# Patient Record
Sex: Male | Born: 1955 | Race: White | Hispanic: No | Marital: Married | Smoking: Current every day smoker
Health system: Southern US, Community
[De-identification: ages and names within clinical notes are randomized; demographics above are authoritative.]

## PROBLEM LIST (undated history)

## (undated) DIAGNOSIS — M1990 Unspecified osteoarthritis, unspecified site: Secondary | ICD-10-CM

## (undated) DIAGNOSIS — J449 Chronic obstructive pulmonary disease, unspecified: Secondary | ICD-10-CM

## (undated) DIAGNOSIS — I2510 Atherosclerotic heart disease of native coronary artery without angina pectoris: Secondary | ICD-10-CM

## (undated) DIAGNOSIS — R7303 Prediabetes: Secondary | ICD-10-CM

## (undated) HISTORY — PX: OTHER SURGICAL HISTORY: SHX169

---

## 1998-05-31 ENCOUNTER — Ambulatory Visit (HOSPITAL_COMMUNITY): Admission: RE | Admit: 1998-05-31 | Discharge: 1998-05-31 | Payer: Self-pay | Admitting: *Deleted

## 1998-08-15 ENCOUNTER — Ambulatory Visit (HOSPITAL_BASED_OUTPATIENT_CLINIC_OR_DEPARTMENT_OTHER): Admission: RE | Admit: 1998-08-15 | Discharge: 1998-08-15 | Payer: Self-pay | Admitting: Orthopedic Surgery

## 2000-07-13 ENCOUNTER — Ambulatory Visit (HOSPITAL_COMMUNITY): Admission: RE | Admit: 2000-07-13 | Discharge: 2000-07-13 | Payer: Self-pay | Admitting: Family Medicine

## 2000-07-16 ENCOUNTER — Ambulatory Visit (HOSPITAL_COMMUNITY): Admission: RE | Admit: 2000-07-16 | Discharge: 2000-07-16 | Payer: Self-pay | Admitting: Family Medicine

## 2000-07-16 ENCOUNTER — Encounter: Payer: Self-pay | Admitting: Family Medicine

## 2000-10-06 ENCOUNTER — Ambulatory Visit (HOSPITAL_BASED_OUTPATIENT_CLINIC_OR_DEPARTMENT_OTHER): Admission: RE | Admit: 2000-10-06 | Discharge: 2000-10-06 | Payer: Self-pay | Admitting: Orthopedic Surgery

## 2001-03-21 ENCOUNTER — Ambulatory Visit (HOSPITAL_COMMUNITY): Admission: RE | Admit: 2001-03-21 | Discharge: 2001-03-21 | Payer: Self-pay | Admitting: General Surgery

## 2001-05-23 ENCOUNTER — Emergency Department (HOSPITAL_COMMUNITY): Admission: EM | Admit: 2001-05-23 | Discharge: 2001-05-24 | Payer: Self-pay | Admitting: Emergency Medicine

## 2003-04-11 ENCOUNTER — Emergency Department (HOSPITAL_COMMUNITY): Admission: EM | Admit: 2003-04-11 | Discharge: 2003-04-11 | Payer: Self-pay | Admitting: Emergency Medicine

## 2004-11-06 ENCOUNTER — Encounter: Admission: RE | Admit: 2004-11-06 | Discharge: 2004-11-06 | Payer: Self-pay | Admitting: Family Medicine

## 2004-11-07 ENCOUNTER — Encounter: Admission: RE | Admit: 2004-11-07 | Discharge: 2004-11-07 | Payer: Self-pay | Admitting: Family Medicine

## 2004-11-09 HISTORY — PX: ANKLE FUSION: SHX881

## 2005-04-14 ENCOUNTER — Encounter: Admission: RE | Admit: 2005-04-14 | Discharge: 2005-04-14 | Payer: Self-pay | Admitting: Family Medicine

## 2005-10-28 ENCOUNTER — Encounter: Admission: RE | Admit: 2005-10-28 | Discharge: 2005-10-28 | Payer: Self-pay | Admitting: Family Medicine

## 2006-07-17 ENCOUNTER — Emergency Department (HOSPITAL_COMMUNITY): Admission: EM | Admit: 2006-07-17 | Discharge: 2006-07-17 | Payer: Self-pay | Admitting: Emergency Medicine

## 2006-08-15 ENCOUNTER — Encounter: Admission: RE | Admit: 2006-08-15 | Discharge: 2006-08-15 | Payer: Self-pay | Admitting: Family Medicine

## 2006-11-09 HISTORY — PX: BACK SURGERY: SHX140

## 2007-04-29 ENCOUNTER — Inpatient Hospital Stay (HOSPITAL_COMMUNITY): Admission: RE | Admit: 2007-04-29 | Discharge: 2007-04-30 | Payer: Self-pay | Admitting: Neurosurgery

## 2008-09-12 ENCOUNTER — Encounter: Admission: RE | Admit: 2008-09-12 | Discharge: 2008-09-12 | Payer: Self-pay | Admitting: Neurosurgery

## 2008-10-24 ENCOUNTER — Inpatient Hospital Stay (HOSPITAL_COMMUNITY): Admission: RE | Admit: 2008-10-24 | Discharge: 2008-10-25 | Payer: Self-pay | Admitting: Orthopedic Surgery

## 2009-02-18 ENCOUNTER — Encounter: Admission: RE | Admit: 2009-02-18 | Discharge: 2009-02-18 | Payer: Self-pay | Admitting: Orthopedic Surgery

## 2009-06-19 ENCOUNTER — Encounter: Admission: RE | Admit: 2009-06-19 | Discharge: 2009-06-19 | Payer: Self-pay

## 2010-11-29 ENCOUNTER — Encounter: Payer: Self-pay | Admitting: Family Medicine

## 2010-12-18 ENCOUNTER — Other Ambulatory Visit: Payer: Self-pay | Admitting: Neurosurgery

## 2010-12-18 DIAGNOSIS — M545 Low back pain: Secondary | ICD-10-CM

## 2010-12-18 DIAGNOSIS — M542 Cervicalgia: Secondary | ICD-10-CM

## 2010-12-21 ENCOUNTER — Ambulatory Visit
Admission: RE | Admit: 2010-12-21 | Discharge: 2010-12-21 | Disposition: A | Discharge status: Home / Self Care | Payer: 59 | Source: Ambulatory Visit | Attending: Neurosurgery | Admitting: Neurosurgery

## 2010-12-21 DIAGNOSIS — M545 Low back pain: Secondary | ICD-10-CM

## 2010-12-21 DIAGNOSIS — M542 Cervicalgia: Secondary | ICD-10-CM

## 2010-12-25 ENCOUNTER — Ambulatory Visit
Admission: RE | Admit: 2010-12-25 | Discharge: 2010-12-25 | Disposition: A | Discharge status: Home / Self Care | Payer: 59 | Source: Ambulatory Visit | Attending: Neurosurgery | Admitting: Neurosurgery

## 2010-12-25 ENCOUNTER — Other Ambulatory Visit: Payer: Self-pay | Admitting: Neurosurgery

## 2010-12-25 DIAGNOSIS — M545 Low back pain: Secondary | ICD-10-CM

## 2011-03-24 NOTE — Op Note (Signed)
NAME:  Daniel Hurley, Daniel Hurley NO.:  [id number]   MEDICAL RECORD NO.:  [id number]          PATIENT TYPE:  INP   LOCATION:  3172                         FACILITY:  [facility]   PHYSICIAN:  Kathaleen Maser. Pool, M.D.    DATE OF BIRTH:  02-05-1956   DATE OF PROCEDURE:  04/29/2007  DATE OF DISCHARGE:                               OPERATIVE REPORT   PREOPERATIVE DIAGNOSIS:  L4-L5 degenerative disc disease, facet  arthropathy, and stenosis.   POSTOPERATIVE DIAGNOSIS:  L4-L5 degenerative disc disease, facet  arthropathy, and stenosis.   PROCEDURE NOTE:  L4-L5 decompressive laminectomy with foraminotomies at  both L4 and L5, more than would be required for simple interbody fusion,  alone.  L4-L5 posterior lumbar interbody fusion utilizing Tangent  interbody allograft wedge, Telamon interbody PEEK cage, and local  autografting.  Posterolateral arthrodesis utilizing nonsegmental pedicle  screw fixation and local autograft.   SURGEON:  Kathaleen Maser. Pool, M.D.   ASSISTANT:  Tia Alert, M.D.   ANESTHESIA:  General.   INDICATIONS:  Daniel Hurley is a 55 year old male injured in a work related  accident with severe left sided low back pain.  Workup demonstrates  evidence of transitional anatomy along the lumbosacral junction with  marked facet arthropathy at what we are calling the L4-L5 level.  The  patient has evidence of some degree of facet fracturing and marked  arthropathy on the left side.  He has failed all efforts at conservative  management.  He presents now for decompression, fusion, and  instrumentation in hopes of improving his symptoms.   OPERATIVE NOTE:  The patient was placed on the operating table in a  supine position.  After an adequate level of anesthesia was achieved,  the patient was placed prone onto a Wilson frame and appropriately  padded.  The patient's lumbar region was prepped and draped in the usual  sterile fashion.  A 10 blade was used to make a linear skin  incision  overlying the L4-L5 interspace.  This was carried sharply in the  midline.  Subperiosteal dissection was performed exposing the lamina and  facet joints of L4 and L5.  Deep self-retaining retractors was placed.  Intraoperative fluoroscopy was used and the levels were confirmed.   Decompressive laminectomy was then performed using Kerrison rongeurs,  Leksell rongeurs, and a high speed drill to remove the entire lamina of  L4, entire inferior facet of L4 bilaterally, and superior facet of L5  bilaterally.  The superior aspect of the lamina at L5 was also resected.  All bone was cleaned and used for later autografting.  Wide  decompressive foraminotomies were then performed along the exiting L4  and L5 nerve roots bilaterally.  Ligamentum flavum was then elevated and  resected in a piecemeal fashion, as well.  Epidural venous plexus were  coagulated and cut.  Starting first on the patient's left side, the  thecal sac and nerve roots were protected and retracted towards the  midline.  The disc space was then incised with a 15 blade in a  rectangular fashion.  A wide disc space clean out  was achieved using  pituitary rongeurs, up and angled pituitary rongeurs, and Epstein  curets.  The procedure was then repeated on the contralateral side.   The disc space was then sequentially distracted 12 mm with a 12 mm  distractor left in the patient's left side.  The thecal sac and nerve  roots were protected on the patient's right side.  The disc space was  then reamed and then cut with 12 mm Tangent instruments.  The soft  tissues were removed from the interspace.  A 12 x 26 mm Telamon cage  packed with morselized autograft and Progenix bone putty was then packed  into place and recessed approximately 2 mm posterior to the cortical  margin.  The distractor was moved to the patient's left side.  The  thecal sac and nerve roots were protected on the left side.  The disc  space then reamed and  cut with 12 mm Tangent instruments.  Soft tissues  were removed from the interspace.  The disc space was further  curettaged.  Morselized autograft mixed with Progenix putty was then  packed in the interspace.  A 12 x 26 mm Tangent wedge was then impacted  into place and recessed approximately 2 mm to the posterior cortical  margin.   The pedicles of L4 and L5 were then identified using surface landmarks  and intraoperative fluoroscopy.  The superficial bone on the pedicle was  then removed using a high speed drill.  Each pedicle was then probed  using a pedicle awl.  Each pedicle awl track was then probed and found  to be solid in bone.  The pedicle awl track was then tapped with 5.25 mm  screw tap.  Each screw tap hole was probed and found to be solid in  bone.  6.75 x 45 mm radius screws were placed bilaterally at L4, 6.75 x  40 mm screws were placed bilaterally at L5.  The transverse processes of  L4 and L5 were then decorticated using the high speed drill.  Morselized  autograft mixed with Progenix putty was packed posterolaterally.  A  short segment titanium rod was placed over the screw heads at L4 and L5.  Locking caps were placed over the screw heads.  Locking caps were then  engaged with the construct under compression.   Final images revealed good position of the bone grafts.  The wound was  then irrigated with antibiotic solution.  Gelfoam was placed topically  for hemostasis which was found to be good.  The retractor system was  removed.  A medium Hemovac drain was left in the epidural space.  The  wound was then closed in layers with Vicryl sutures.  Steri-Strips and  sterile dressings were applied.  There were no complications.  The  patient tolerated the procedure well and he returns to the recovery room  for postoperative care.           ______________________________  Kathaleen Maser. Pool, M.D.     HAP/MEDQ  D:  04/29/2007  T:  04/29/2007  Job:  [id number]

## 2011-03-24 NOTE — Op Note (Signed)
NAME:  CASHEL, BELLINA NO.:  [id number]   MEDICAL RECORD NO.:  [id number]          PATIENT TYPE:  INP   LOCATION:  5017                         FACILITY:  [facility]   PHYSICIAN:  Nadara Mustard, MD     DATE OF BIRTH:  1955-12-01   DATE OF PROCEDURE:  10/24/2008  DATE OF DISCHARGE:                               OPERATIVE REPORT   PREOPERATIVE DIAGNOSIS:  Nonunion right subtalar fusion.   POSTOPERATIVE DIAGNOSIS:  Nonunion right subtalar fusion.   PROCEDURES:  1. Removal of deep retained hardware.  2. Open right subtalar fusion with use of the extra-small infuse      graft.   SURGEON:  Nadara Mustard, MD   ANESTHESIA:  General.   ESTIMATED BLOOD LOSS:  Minimal.   ANTIBIOTICS:  1 g of Kefzol.   DRAINS:  None.   COMPLICATIONS:  None.   DISPOSITION:  To PACU in stable condition.   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  The patient underwent a previous posterior  subtalar arthroscopic fusion.  The patient had persistent pain.  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  He had persistent pain and  presents at this time for revision of surgery.  Risks and benefits were  discussed including infection, neurovascular injury, nonhealing of the  wound, nonhealing of the bone, need for additional surgery.  The patient  states he understands and wished to proceed at this time.   DESCRIPTION OF PROCEDURE:  The patient was brought to OR room #5 and  underwent a general anesthetic.  After adequate level of anesthesia was  obtained, the patient's right lower extremity was prepped using DuraPrep  and draped in a sterile field.  Collier Flowers was used to cover all exposed  skin.  A posterior incision was made over the previous posterior  cannulated screws placement.  A  guidewire was inserted over the screws  and the 2 screws were removed with the assistance from the C-arm  fluoroscopy.  Attention was then focused over the sinus tarsi region in  line with the skin creases.  An oblique incision was made over the sinus  tarsi.  The extensor muscles were elevated proximally and retracted  distally.  An osteotome, curette were used to debride the subtalar  joint.  C-arm fluoroscopy verified complete debridement of the subtalar  joint.  An incision was made dorsally just medial to the anterior tibial  tendon.  Blunt dissection was carried down to the talar neck.  A  guidewire was inserted from the talus across the subtalar joint into the  subtalar into the calcaneus.  This was overdrilled and then a 80 mm x  7.3 cannulated screw was inserted.  C-arm fluoroscopy verified  reduction, both AP and lateral planes.  The subtalar joint was again  irrigated and this was then packed with the patient's own bone graft as  well as the Infuse bone morphogenic  protein.  The muscle, fascial layer  was then closed using 2-0 Vicryl.  The skin was closed using 2-0 nylon  with modified vertical mattress suture.  All 3 wounds were closed with  the nylon.  The wounds were then covered with Adaptic, orthopedic  sponges, ABD dressing, Webril, Kerlix, and Coban.  The patient was  extubated, taken to PACU in stable condition.      Nadara Mustard, MD  Electronically Signed     MVD/MEDQ  D:  10/24/2008  T:  10/24/2008  Job:  [id number]

## 2011-03-27 NOTE — Op Note (Signed)
[facility]  Patient:    ALTON, BOUKNIGHT                      MRN: [id number] Proc. Date: 03/21/01 Adm. Date:  [id number] Attending:  Tempie Donning CC:         Lillia Carmel, M.D.   Operative Report  OPERATIVE PROCEDURE:  Repair left inguinal hernia - combined direct and indirect with Prolene mesh preperitoneal and onlay.  SURGEON:  Gita Kudo, M.D.  ANESTHESIA:  General  - by oral airway.  PREOPERATIVE DIAGNOSIS:  Left inguinal hernia.  POSTOPERATIVE DIAGNOSES:  Left inguinal hernia, medium-large direct and very small indirect, also lipoma of the cord.  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  OPERATIVE FINDINGS:  The patient had a medium-sized to large-size direct hernia.  There was a very small indirect hernia sac that was empty.  There was a large lipoma of the cord.  OPERATIVE PROCEDURE:  Under satisfactory general anesthesia, having received 1.0 gram Ancef preop, the patients abdomen and genitalia were prepped and draped in a standard fashion.  A transverse incision was made and carried down to and through the external ring.  The cord and its contents were mobilized. Bleeders were coagulated or tied with 3-0 Vicryl.  Self-retaining retractors gave excellent exposure.  The fatty lipoma of the cord was dissected high, clamped, excised, and the space tied with 3-0 Vicryl.  A very small indirect sac identified, dissected high, twisted, and ligated again with 3-0 Vicryl doubly.  Then the floor of the canal was opened from the pubis medially to the internal ring.  Finger dissection used to develop the preperitoneal space, and then the contents reduced and held away with a moistened gauze.  A third of the 3 x 6 inch piece of Prolene mesh was tailored into an oval to fit in the floor.  It was anchored at [facility] ligament with a 0  Prolene suture and then unfolded inferiorly and laterally.  The packing was then removed and the mesh unfolded superiorly and medially and extended all the way up under the inferior epigastric vessels.  The floor of the canal was then closed over this with running 0 Prolene suture, taking intermittent bites of the mesh.  At the internal ring, the suture was tied and the ends left long.  The ring was snug. The remainder of the mesh was tailored into an oval with the slit to go around the cord structures.  It was anchored at the internal ring with a previous suture and then tacked around the periphery under slight tension with 0 Prolene to the inguinal ligament below and the internal oblique above.  The tails were sutured to each other and the fascia above and lateral to the cord. The wound was then infiltrated with 40 cc of Marcaine for postop analgesia, lavaged with saline, and closed in layers.  Running 2-0 Vicryl approximated the external oblique, interrupted 2-0 Vicryl for deep fascia, 3-0 Vicryl for subcutaneous, Steri-Strips for skin.  Sterile absorbent dressings applied, and the patient went to the recovery room from the operating room in good condition. DD:  03/21/01 TD:  03/21/01 Job: [id number]

## 2011-08-13 LAB — CBC
HCT: 49 % (ref 39.0–52.0)
Hemoglobin: 16.6 g/dL (ref 13.0–17.0)
MCHC: 33.8 g/dL (ref 30.0–36.0)
MCV: 92.3 fL (ref 78.0–100.0)
RBC: 5.3 MIL/uL (ref 4.22–5.81)
RDW: 13.7 % (ref 11.5–15.5)

## 2011-08-13 LAB — COMPREHENSIVE METABOLIC PANEL
ALT: 13 U/L (ref 0–53)
BUN: 8 mg/dL (ref 6–23)
CO2: 28 mEq/L (ref 19–32)
Calcium: 9.9 mg/dL (ref 8.4–10.5)
GFR calc non Af Amer: >60 mL/min (ref >60)
Glucose, Bld: 90 mg/dL (ref 70–99)
Sodium: 140 mEq/L (ref 135–145)
Total Protein: 6.8 g/dL (ref 6.0–8.3)

## 2011-08-13 LAB — PROTIME-INR
INR: 0.9 (ref 0.00–1.49)
Prothrombin Time: 12.3 seconds (ref 11.6–15.2)
Prothrombin Time: 13.5 seconds (ref 11.6–15.2)

## 2011-08-26 LAB — CBC
HCT: 45.8
MCV: 89.3
Platelets: 263
RDW: 13.6

## 2011-08-26 LAB — BASIC METABOLIC PANEL
BUN: 9
Chloride: 107
Creatinine, Ser: 1.04
GFR calc non Af Amer: >60
Glucose, Bld: 107 — ABNORMAL HIGH
Potassium: 3.9

## 2011-08-26 LAB — TYPE AND SCREEN: ABO/RH(D): O POS

## 2011-08-26 LAB — DIFFERENTIAL
Basophils Absolute: 0
Eosinophils Absolute: 0.2
Eosinophils Relative: 2
Neutrophils Relative %: 57

## 2014-12-12 ENCOUNTER — Other Ambulatory Visit: Payer: Self-pay | Admitting: Internal Medicine

## 2014-12-12 ENCOUNTER — Ambulatory Visit
Admission: RE | Admit: 2014-12-12 | Discharge: 2014-12-12 | Disposition: A | Discharge status: Home / Self Care | Payer: 59 | Source: Ambulatory Visit | Attending: Internal Medicine | Admitting: Internal Medicine

## 2014-12-12 DIAGNOSIS — R0602 Shortness of breath: Secondary | ICD-10-CM

## 2016-01-18 ENCOUNTER — Encounter (HOSPITAL_COMMUNITY): Payer: Self-pay

## 2016-01-18 ENCOUNTER — Emergency Department (HOSPITAL_COMMUNITY): Payer: Medicare Other

## 2016-01-18 DIAGNOSIS — T380X5A Adverse effect of glucocorticoids and synthetic analogues, initial encounter: Secondary | ICD-10-CM | POA: Diagnosis present

## 2016-01-18 DIAGNOSIS — Z981 Arthrodesis status: Secondary | ICD-10-CM

## 2016-01-18 DIAGNOSIS — F17210 Nicotine dependence, cigarettes, uncomplicated: Secondary | ICD-10-CM | POA: Diagnosis present

## 2016-01-18 DIAGNOSIS — R079 Chest pain, unspecified: Secondary | ICD-10-CM | POA: Diagnosis not present

## 2016-01-18 DIAGNOSIS — Z23 Encounter for immunization: Secondary | ICD-10-CM

## 2016-01-18 DIAGNOSIS — J441 Chronic obstructive pulmonary disease with (acute) exacerbation: Principal | ICD-10-CM | POA: Diagnosis present

## 2016-01-18 DIAGNOSIS — Z7982 Long term (current) use of aspirin: Secondary | ICD-10-CM

## 2016-01-18 DIAGNOSIS — D72829 Elevated white blood cell count, unspecified: Secondary | ICD-10-CM | POA: Diagnosis present

## 2016-01-18 DIAGNOSIS — J9601 Acute respiratory failure with hypoxia: Secondary | ICD-10-CM | POA: Diagnosis present

## 2016-01-18 LAB — CBC
HEMATOCRIT: 48.4 % (ref 39.0–52.0)
HEMOGLOBIN: 16.2 g/dL (ref 13.0–17.0)
MCH: 30.7 pg (ref 26.0–34.0)
MCHC: 33.5 g/dL (ref 30.0–36.0)
MCV: 91.7 fL (ref 78.0–100.0)
Platelets: 259 10*3/uL (ref 150–400)
RBC: 5.28 MIL/uL (ref 4.22–5.81)
RDW: 13.3 % (ref 11.5–15.5)
WBC: 18.2 10*3/uL — AB (ref 4.0–10.5)

## 2016-01-18 LAB — BASIC METABOLIC PANEL
ANION GAP: 13 (ref 5–15)
BUN: 13 mg/dL (ref 6–20)
CALCIUM: 9.4 mg/dL (ref 8.9–10.3)
CO2: 22 mmol/L (ref 22–32)
Chloride: 102 mmol/L (ref 101–111)
Creatinine, Ser: 1.03 mg/dL (ref 0.61–1.24)
GLUCOSE: 130 mg/dL — AB (ref 65–99)
POTASSIUM: 4 mmol/L (ref 3.5–5.1)
SODIUM: 137 mmol/L (ref 135–145)

## 2016-01-18 LAB — I-STAT TROPONIN, ED: TROPONIN I, POC: 0 ng/mL (ref 0.00–0.08)

## 2016-01-18 MED ORDER — OXYCODONE-ACETAMINOPHEN 5-325 MG PO TABS
ORAL_TABLET | ORAL | Status: AC
  Filled 2016-01-18: qty 1

## 2016-01-18 MED ORDER — OXYCODONE-ACETAMINOPHEN 5-325 MG PO TABS
1.0000 | ORAL_TABLET | Freq: Once | ORAL | Status: AC
Start: 2016-01-18 — End: 2016-01-18
  Administered 2016-01-18: 1 via ORAL

## 2016-01-18 NOTE — ED Notes (Signed)
Pt reports onset yesterday left chest pain radiating down left arm, constant.  Pt took ASA 81 mg @ 1900 tonight and pain relieved for about 25 min then returned.  Pt reports some increase in usual shortness of breath.  No nausea, diaphoresis.

## 2016-01-19 ENCOUNTER — Encounter (HOSPITAL_COMMUNITY): Payer: Self-pay | Admitting: Radiology

## 2016-01-19 ENCOUNTER — Inpatient Hospital Stay (HOSPITAL_COMMUNITY)
Admission: EM | Admit: 2016-01-19 | Discharge: 2016-01-21 | DRG: 190 | Disposition: A | Discharge status: Home / Self Care | Payer: Medicare Other | Attending: Internal Medicine | Admitting: Internal Medicine

## 2016-01-19 ENCOUNTER — Emergency Department (HOSPITAL_COMMUNITY): Payer: Medicare Other

## 2016-01-19 DIAGNOSIS — Z720 Tobacco use: Secondary | ICD-10-CM | POA: Diagnosis present

## 2016-01-19 DIAGNOSIS — R0789 Other chest pain: Secondary | ICD-10-CM | POA: Diagnosis not present

## 2016-01-19 DIAGNOSIS — D72829 Elevated white blood cell count, unspecified: Secondary | ICD-10-CM | POA: Diagnosis present

## 2016-01-19 DIAGNOSIS — F17210 Nicotine dependence, cigarettes, uncomplicated: Secondary | ICD-10-CM | POA: Diagnosis present

## 2016-01-19 DIAGNOSIS — J441 Chronic obstructive pulmonary disease with (acute) exacerbation: Secondary | ICD-10-CM | POA: Diagnosis not present

## 2016-01-19 DIAGNOSIS — Z7982 Long term (current) use of aspirin: Secondary | ICD-10-CM | POA: Diagnosis not present

## 2016-01-19 DIAGNOSIS — R079 Chest pain, unspecified: Secondary | ICD-10-CM | POA: Diagnosis present

## 2016-01-19 DIAGNOSIS — J9601 Acute respiratory failure with hypoxia: Secondary | ICD-10-CM

## 2016-01-19 DIAGNOSIS — Z981 Arthrodesis status: Secondary | ICD-10-CM | POA: Diagnosis not present

## 2016-01-19 DIAGNOSIS — T380X5A Adverse effect of glucocorticoids and synthetic analogues, initial encounter: Secondary | ICD-10-CM | POA: Diagnosis present

## 2016-01-19 DIAGNOSIS — Z23 Encounter for immunization: Secondary | ICD-10-CM | POA: Diagnosis not present

## 2016-01-19 HISTORY — DX: Chronic obstructive pulmonary disease, unspecified: J44.9

## 2016-01-19 LAB — TROPONIN I: Troponin I: <0.03 ng/mL (ref <0.031)

## 2016-01-19 LAB — CBC
HEMATOCRIT: 47 % (ref 39.0–52.0)
Hemoglobin: 15.7 g/dL (ref 13.0–17.0)
MCH: 30.8 pg (ref 26.0–34.0)
MCHC: 33.4 g/dL (ref 30.0–36.0)
MCV: 92.2 fL (ref 78.0–100.0)
Platelets: 236 10*3/uL (ref 150–400)
RBC: 5.1 MIL/uL (ref 4.22–5.81)
RDW: 13.5 % (ref 11.5–15.5)
WBC: 13.5 10*3/uL — ABNORMAL HIGH (ref 4.0–10.5)

## 2016-01-19 LAB — CREATININE, SERUM
Creatinine, Ser: 0.92 mg/dL (ref 0.61–1.24)
GFR calc Af Amer: >60 mL/min (ref >60)
GFR calc non Af Amer: >60 mL/min (ref >60)

## 2016-01-19 LAB — TSH: TSH: 0.731 u[IU]/mL (ref 0.350–4.500)

## 2016-01-19 LAB — D-DIMER, QUANTITATIVE (NOT AT ARMC): D DIMER QUANT: 0.74 ug{FEU}/mL — AB (ref 0.00–0.50)

## 2016-01-19 MED ORDER — ONDANSETRON HCL 4 MG/2ML IJ SOLN: 4.0000 mg | Freq: Four times a day (QID) | INTRAMUSCULAR | Status: DC | PRN

## 2016-01-19 MED ORDER — ASPIRIN 81 MG PO CHEW
324.0000 mg | CHEWABLE_TABLET | Freq: Once | ORAL | Status: AC
  Administered 2016-01-19: 324 mg via ORAL
  Filled 2016-01-19: qty 4

## 2016-01-19 MED ORDER — METOPROLOL TARTRATE 25 MG PO TABS
25.0000 mg | ORAL_TABLET | Freq: Two times a day (BID) | ORAL | Status: DC
  Administered 2016-01-19 – 2016-01-21 (×5): 25 mg via ORAL
  Filled 2016-01-19 (×5): qty 1

## 2016-01-19 MED ORDER — PNEUMOCOCCAL VAC POLYVALENT 25 MCG/0.5ML IJ INJ
0.5000 mL | INJECTION | INTRAMUSCULAR | Status: DC
  Filled 2016-01-19: qty 0.5
  Filled 2016-01-19: qty 1

## 2016-01-19 MED ORDER — IPRATROPIUM-ALBUTEROL 0.5-2.5 (3) MG/3ML IN SOLN
3.0000 mL | Freq: Once | RESPIRATORY_TRACT | Status: AC
  Administered 2016-01-19: 3 mL via RESPIRATORY_TRACT
  Filled 2016-01-19: qty 3

## 2016-01-19 MED ORDER — ALBUTEROL SULFATE (2.5 MG/3ML) 0.083% IN NEBU: 2.5000 mg | INHALATION_SOLUTION | RESPIRATORY_TRACT | Status: DC | PRN

## 2016-01-19 MED ORDER — MORPHINE SULFATE (PF) 2 MG/ML IV SOLN: 2.0000 mg | INTRAVENOUS | Status: DC | PRN

## 2016-01-19 MED ORDER — IOHEXOL 350 MG/ML SOLN
80.0000 mL | Freq: Once | INTRAVENOUS | Status: AC | PRN
  Administered 2016-01-19: 80 mL via INTRAVENOUS

## 2016-01-19 MED ORDER — ASPIRIN 81 MG PO CHEW
81.0000 mg | CHEWABLE_TABLET | Freq: Every day | ORAL | Status: DC
  Administered 2016-01-19 – 2016-01-21 (×3): 81 mg via ORAL
  Filled 2016-01-19 (×3): qty 1

## 2016-01-19 MED ORDER — ONDANSETRON HCL 4 MG PO TABS: 4.0000 mg | ORAL_TABLET | Freq: Four times a day (QID) | ORAL | Status: DC | PRN

## 2016-01-19 MED ORDER — ENOXAPARIN SODIUM 40 MG/0.4ML ~~LOC~~ SOLN
40.0000 mg | SUBCUTANEOUS | Status: DC
  Administered 2016-01-19: 40 mg via SUBCUTANEOUS
  Filled 2016-01-19 (×3): qty 0.4

## 2016-01-19 MED ORDER — ATORVASTATIN CALCIUM 40 MG PO TABS
40.0000 mg | ORAL_TABLET | Freq: Every day | ORAL | Status: DC
  Administered 2016-01-19 – 2016-01-20 (×2): 40 mg via ORAL
  Filled 2016-01-19 (×2): qty 1

## 2016-01-19 MED ORDER — ALBUTEROL SULFATE (2.5 MG/3ML) 0.083% IN NEBU
2.5000 mg | INHALATION_SOLUTION | Freq: Four times a day (QID) | RESPIRATORY_TRACT | Status: DC
  Administered 2016-01-19: 2.5 mg via RESPIRATORY_TRACT
  Filled 2016-01-19: qty 3

## 2016-01-19 MED ORDER — METHYLPREDNISOLONE SODIUM SUCC 40 MG IJ SOLR
40.0000 mg | Freq: Four times a day (QID) | INTRAMUSCULAR | Status: DC
  Administered 2016-01-19 – 2016-01-21 (×9): 40 mg via INTRAVENOUS
  Filled 2016-01-19 (×9): qty 1

## 2016-01-19 MED ORDER — LEVOFLOXACIN IN D5W 750 MG/150ML IV SOLN
750.0000 mg | INTRAVENOUS | Status: DC
  Administered 2016-01-19: 750 mg via INTRAVENOUS
  Filled 2016-01-19: qty 150

## 2016-01-19 MED ORDER — ALBUTEROL SULFATE (2.5 MG/3ML) 0.083% IN NEBU
2.5000 mg | INHALATION_SOLUTION | Freq: Three times a day (TID) | RESPIRATORY_TRACT | Status: DC
  Administered 2016-01-19 – 2016-01-20 (×4): 2.5 mg via RESPIRATORY_TRACT
  Filled 2016-01-19 (×5): qty 3

## 2016-01-19 NOTE — ED Provider Notes (Signed)
CSN: [id number]     Arrival date & time 01/18/16  2029 History  By signing my name below, I, Octavia Heir, attest that this documentation has been prepared under the direction and in the presence of Glynn Octave, MD. Electronically Signed: Octavia Heir, ED Scribe. 01/19/2016. 1:33 AM.    Chief Complaint  Patient presents with  . Chest Pain     The history is provided by the patient. No language interpreter was used.   HPI Comments: Daniel Hurley is a 60 y.o. male who has a hx of COPD and emphysema presents to the Emergency Department complaining of constant, waxing and waning, moderate, 2/10, left sided chest pain onset last night. Pt notes the pain has been radiating down his left arm with associated shortness of breath and cough. He states he took a baby aspirin tonight to alleviate is pain with temporary relief for about 30 minutes. Pt endorses increased pain with ambulating and taking a deep breath. He notes his last stress test was a few years ago. Denies diaphoresis, nausea, vomiting, abdominal pain, pain or swelling in legs, falls, and injuries. Pt is a smoker.  Past Medical History  Diagnosis Date  . COPD (chronic obstructive pulmonary disease) [facility])    Past Surgical History  Procedure Laterality Date  . Back surgery  2008    L5-5 fusion   . Ankle fusion  2006  . Left wrist surgery     History reviewed. No pertinent family history. Social History  Substance Use Topics  . Smoking status: Current Every Day Smoker -- 1.50 packs/day    Types: Cigarettes  . Smokeless tobacco: None  . Alcohol Use: No    Review of Systems  A complete 10 system review of systems was obtained and all systems are negative except as noted in the HPI and PMH.    Allergies  Review of patient's allergies indicates no known allergies.  Home Medications   Prior to Admission medications   Medication Sig Start Date End Date Taking? Authorizing Provider  aspirin 81 MG chewable tablet Chew 81  mg by mouth daily.   Yes Historical Provider, MD  HYDROcodone-acetaminophen (NORCO) 7.5-325 MG tablet Take 1 tablet by mouth 3 (three) times daily as needed for moderate pain.  12/19/15  Yes Historical Provider, MD  sildenafil (REVATIO) 20 MG tablet Take 20 mg by mouth 2 (two) times daily. 12/23/15  Yes Historical Provider, MD   Triage vitals: BP 139/88 mmHg  Pulse 87  Temp(Src) 99.2 F (37.3 C) (Oral)  Resp 16  Ht  (1.702 m)  Wt 193 lb 12.8 oz (87.907 kg)  BMI 30.35 kg/m2  SpO2 98% Physical Exam  Constitutional: He is oriented to person, place, and time. He appears well-developed and well-nourished. No distress.  HENT:  Head: Normocephalic and atraumatic.  Mouth/Throat: Oropharynx is clear and moist. No oropharyngeal exudate.  Eyes: Conjunctivae and EOM are normal. Pupils are equal, round, and reactive to light.  Neck: Normal range of motion. Neck supple.  No meningismus.  Cardiovascular: Normal rate, regular rhythm, normal heart sounds and intact distal pulses.   No murmur heard. Pulmonary/Chest: Effort normal and breath sounds normal. No respiratory distress. He exhibits no tenderness.  Diffuse expiratory wheezing with decreased air exchange  Abdominal: Soft. There is no tenderness. There is no rebound and no guarding.  Musculoskeletal: Normal range of motion. He exhibits no edema or tenderness.  No pain with left arm movement  Neurological: He is alert and oriented to person,  place, and time. No cranial nerve deficit. He exhibits normal muscle tone. Coordination normal.  No ataxia on finger to nose bilaterally. No pronator drift. 5/5 strength throughout. CN 2-12 intact.Equal grip strength. Sensation intact.   Skin: Skin is warm.  Psychiatric: He has a normal mood and affect. His behavior is normal.  Nursing note and vitals reviewed.   ED Course  Procedures  DIAGNOSTIC STUDIES: Oxygen Saturation is 98% on RA, normal by my interpretation.  COORDINATION OF CARE:  1:28 AM  Discussed treatment plan which includes breathing treatment and lab work with pt at bedside and pt agreed to plan.  Labs Review Labs Reviewed  BASIC METABOLIC PANEL - Abnormal; Notable for the following:    Glucose, Bld 130 (*)    All other components within normal limits  CBC - Abnormal; Notable for the following:    WBC 18.2 (*)    All other components within normal limits  D-DIMER, QUANTITATIVE (NOT AT [facility]) - Abnormal; Notable for the following:    D-Dimer, Quant 0.74 (*)    All other components within normal limits  CBC - Abnormal; Notable for the following:    WBC 13.5 (*)    All other components within normal limits  TROPONIN I  CREATININE, SERUM  TSH  TROPONIN I  TROPONIN I  TROPONIN I  Rosezena SensorI-STAT TROPOININ, ED    Imaging Review Dg Chest 2 View  01/18/2016  CLINICAL DATA:  COPD.  Chest pain EXAM: CHEST  2 VIEW COMPARISON:  12/12/2014 FINDINGS: Normal heart size and stable mild aortic tortuosity. Chronic hyperinflation and interstitial coarsening. There is no edema, consolidation, effusion, or pneumothorax. Healing/healed lateral left tenth rib fracture. IMPRESSION: No evidence of active disease. Electronically Signed   By: Marnee SpringJonathon  Watts M.D.   On: 01/18/2016 20:18   Ct Angio Chest Pe W/cm &/or Wo Cm  01/19/2016  CLINICAL DATA:  Left-sided chest pain radiating to the left arm. EXAM: CT ANGIOGRAPHY CHEST WITH CONTRAST TECHNIQUE: Multidetector CT imaging of the chest was performed using the standard protocol during bolus administration of intravenous contrast. Multiplanar CT image reconstructions and MIPs were obtained to evaluate the vascular anatomy. CONTRAST:  80mL OMNIPAQUE IOHEXOL 350 MG/ML SOLN COMPARISON:  04/14/2005 FINDINGS: THORACIC INLET/BODY WALL: No acute abnormality. MEDIASTINUM: Normal heart size. No pericardial effusion. Atherosclerotic calcifications seen in the proximal LAD. Limited opacification of the aorta. No evidence of acute aortic disease. No evidence of  pulmonary embolism. LUNG WINDOWS: Diffuse marked airway thickening. Mild scattered atelectasis. There is no edema, consolidation, effusion, or pneumothorax. UPPER ABDOMEN: No acute findings.  Left nephrolithiasis. OSSEOUS: No acute fracture.  No suspicious lytic or blastic lesions. Review of the MIP images confirms the above findings. IMPRESSION: 1. No evidence of pulmonary embolism. 2. Bronchitis. 3. Coronary atherosclerosis. Electronically Signed   By: Marnee SpringJonathon  Watts M.D.   On: 01/19/2016 05:49   I have personally reviewed and evaluated these images and lab results as part of my medical decision-making.   EKG Interpretation   Date/Time:  Saturday January 18 2016 22:42:25 EST Ventricular Rate:  86 PR Interval:  166 QRS Duration: 68 QT Interval:  370 QTC Calculation: 442 R Axis:   31 Text Interpretation:  Normal sinus rhythm Normal ECG No significant change  was found Confirmed by Manus GunningANCOUR  MD, Tirzah Fross ([id number]) on 01/19/2016 1:30:46  AM      MDM   Final diagnoses:  Chest pain, unspecified chest pain type   left-sided chest pain ongoing for the past 2 days rating  to left arm somewhat constant but waxes and wanes in severity. Somewhat improved with  aspirin. Some shortness of breath worse with exertion.   EKG is normal sinus rhythm. Initial troponin is negative. Chest x-ray is negative.   Heart score 3. Wheezing resolved after neb.  No distress. No chest pain at rest.  Still pain with exertion. Patient with both typical and atypical features of pain. D-dimer elevated, CTPE negative but does show LAD calcification.  Admission for rule out d/w Dr.Jenkins.   I personally performed the services described in this documentation, which was scribed in my presence. The recorded information has been reviewed and is accurate.   Glynn Octave, MD 01/19/16 [phone]

## 2016-01-19 NOTE — ED Notes (Signed)
Patient presents with c/o left sided CP that goes into his left arm.  States it has been going on for 2 days almost constantly.  Stated he took 1 81mg  ASA and the pain stopped for a short time but them came back.  At this time he is not hurting per patient "they came me a pain pill in the lobby".  Denis diaphoresis

## 2016-01-19 NOTE — H&P (Signed)
[facility] History and Physical  Daniel Hurley ZOX:[id number] DOB: 13-Feb-1956    PCP:   [name], MD   Chief Complaint: left sided CP, coughs, SOB.   HPI: Daniel Hurley is an 60 y.o. male active smoker, hx of COPD, back surgery, but no known CAD, presented to the ER with left sided CP with coughing.  He also has a chronic cough, audible wheezing, and SOB.  He did not have exertional CP, and has no fever, chills, distant travel or ill contact.  Evaluation in the ER included a D Dimer of 0.74, resulting in a negative CTPA for PE or PNA, but showed coronary calcification.  He was given Nebs, and IV steroids, and hospitalist was asked to admit him for chest pain r/out. His EKG and troponin were unremarkable.   Rewiew of Systems:  Constitutional: Negative for malaise, fever and chills. No significant weight loss or weight gain Eyes: Negative for eye pain, redness and discharge, diplopia, visual changes, or flashes of light. ENMT: Negative for ear pain, hoarseness, nasal congestion, sinus pressure and sore throat. No headaches; tinnitus, drooling, or problem swallowing. Cardiovascular: Negative for chest pain, palpitations, diaphoresis, dyspnea and peripheral edema. ; No orthopnea, PND Respiratory: Negative for  hemoptysis,  and stridor. No pleuritic chestpain. Gastrointestinal: Negative for nausea, vomiting, diarrhea, constipation, abdominal pain, melena, blood in stool, hematemesis, jaundice and rectal bleeding.    Genitourinary: Negative for frequency, dysuria, incontinence,flank pain and hematuria; Musculoskeletal: Negative for back pain and neck pain. Negative for swelling and trauma.;  Skin: . Negative for pruritus, rash, abrasions, bruising and skin lesion.; ulcerations Neuro: Negative for headache, lightheadedness and neck stiffness. Negative for weakness, altered level of consciousness , altered mental status, extremity weakness, burning feet, involuntary movement,  seizure and syncope.  Psych: negative for anxiety, depression, insomnia, tearfulness, panic attacks, hallucinations, paranoia, suicidal or homicidal ideation    Past Medical History  Diagnosis Date  . COPD (chronic obstructive pulmonary disease) [facility])     Past Surgical History  Procedure Laterality Date  . Back surgery  2008    L5-5 fusion   . Ankle fusion  2006  . Left wrist surgery      Medications:  HOME MEDS: Prior to Admission medications   Medication Sig Start Date End Date Taking? Authorizing Provider  aspirin 81 MG chewable tablet Chew 81 mg by mouth daily.   Yes Historical Provider, MD  HYDROcodone-acetaminophen (NORCO) 7.5-325 MG tablet Take 1 tablet by mouth 3 (three) times daily as needed for moderate pain.  12/19/15  Yes Historical Provider, MD  sildenafil (REVATIO) 20 MG tablet Take 20 mg by mouth 2 (two) times daily. 12/23/15  Yes Historical Provider, MD     Allergies:  No Known Allergies  Social History:   reports that he has been smoking Cigarettes.  He has been smoking about 1.50 packs per day. He does not have any smokeless tobacco history on file. He reports that he does not drink alcohol or use illicit drugs.  Family History: History reviewed. No pertinent family history.   Physical Exam: Filed Vitals:   01/19/16 0614 01/19/16 0615 01/19/16 0700 01/19/16 0745  BP: 140/96 146/93 144/95 153/86  Pulse: 72 72 78 71  Temp:      TempSrc:      Resp: Height:      Weight:      SpO2: 99% 92% 93% 93%   Blood pressure 153/86, pulse 71, temperature 99.2 F (37.3  C), temperature source Oral, resp. rate 18, height 5\' 7"  (1.702 m), weight 87.907 kg (193 lb 12.8 oz), SpO2 93 %.  GEN:  Pleasant  patient lying in the stretcher in no acute distress; cooperative with exam. PSYCH:  alert and oriented x4; does not appear anxious or depressed; affect is appropriate. HEENT: Mucous membranes pink and anicteric; PERRLA; EOM intact; no cervical  lymphadenopathy nor thyromegaly or carotid bruit; no JVD; There were no stridor. Neck is very supple. Breasts:: Not examined CHEST WALL: palpable tenderness on left lower chest.  CHEST: Normal respiration, inspiratory and expiratory wheezing.  No rales.  HEART: Regular rate and rhythm.  There are no murmur, rub, or gallops.   BACK: No kyphosis or scoliosis; no CVA tenderness ABDOMEN: soft and non-tender; no masses, no organomegaly, normal abdominal bowel sounds; no pannus; no intertriginous candida. There is no rebound and no distention. Rectal Exam: Not done EXTREMITIES: No bone or joint deformity; age-appropriate arthropathy of the hands and knees; no edema; no ulcerations.  There is no calf tenderness. Genitalia: not examined PULSES: 2+ and symmetric SKIN: Normal hydration no rash or ulceration CNS: Cranial nerves 2-12 grossly intact no focal lateralizing neurologic deficit.  Speech is fluent; uvula elevated with phonation, facial symmetry and tongue midline. DTR are normal bilaterally, cerebella exam is intact, barbinski is negative and strengths are equaled bilaterally.  No sensory loss.   Labs on Admission:  Basic Metabolic Panel:  Recent Labs Lab 01/18/16 2052  NA 137  K 4.0  CL 102  CO2 22  GLUCOSE 130*  BUN 13  CREATININE 1.03  CALCIUM 9.4   CBC:  Recent Labs Lab 01/18/16 2052  WBC 18.2*  HGB 16.2  HCT 48.4  MCV 91.7  PLT 259   Cardiac Enzymes:  Recent Labs Lab 01/19/16 0143  TROPONINI <0.03    CBG: No results for input(s): GLUCAP in the last 168 hours.   Radiological Exams on Admission: Dg Chest 2 View  01/18/2016  CLINICAL DATA:  COPD.  Chest pain EXAM: CHEST  2 VIEW COMPARISON:  12/12/2014 FINDINGS: Normal heart size and stable mild aortic tortuosity. Chronic hyperinflation and interstitial coarsening. There is no edema, consolidation, effusion, or pneumothorax. Healing/healed lateral left tenth rib fracture. IMPRESSION: No evidence of active disease.  Electronically Signed   By: Daniel SpringJonathon  Hurley M.D.   On: 01/18/2016 20:18   Ct Angio Chest Pe W/cm &/or Wo Cm  01/19/2016  CLINICAL DATA:  Left-sided chest pain radiating to the left arm. EXAM: CT ANGIOGRAPHY CHEST WITH CONTRAST TECHNIQUE: Multidetector CT imaging of the chest was performed using the standard protocol during bolus administration of intravenous contrast. Multiplanar CT image reconstructions and MIPs were obtained to evaluate the vascular anatomy. CONTRAST:  80mL OMNIPAQUE IOHEXOL 350 MG/ML SOLN COMPARISON:  04/14/2005 FINDINGS: THORACIC INLET/BODY WALL: No acute abnormality. MEDIASTINUM: Normal heart size. No pericardial effusion. Atherosclerotic calcifications seen in the proximal LAD. Limited opacification of the aorta. No evidence of acute aortic disease. No evidence of pulmonary embolism. LUNG WINDOWS: Diffuse marked airway thickening. Mild scattered atelectasis. There is no edema, consolidation, effusion, or pneumothorax. UPPER ABDOMEN: No acute findings.  Left nephrolithiasis. OSSEOUS: No acute fracture.  No suspicious lytic or blastic lesions. Review of the MIP images confirms the above findings. IMPRESSION: 1. No evidence of pulmonary embolism. 2. Bronchitis. 3. Coronary atherosclerosis. Electronically Signed   By: Daniel SpringJonathon  Hurley M.D.   On: 01/19/2016 05:49    EKG: Independently reviewed.    Assessment/Plan Present on Admission:  .  Chest pain . COPD exacerbation (HCC) . Tobacco abuse  PLAN:  Atypical CP:  I think it is musculoskeletal wall tenderness, as evidence clinically when he coughs.  He has calcification in his coronary, and would benefit with risk reduction, and follow up outpatient stress test. Will cycle his troponins.  Needs to quit cigarettes, and obtain lipid profile in am.  Given his HTN, will add betablocker to his regimen. Start ASA daily.  Will start Lipitor, though he is resistant to taking medications.   COPD Exacerbation:  Will continue with Nebs, IV  steroids, and antibiotics.  Give cough suppressant.   He should be discharge on an inhaler.Marland KitchenMarland KitchenMarland KitchenMay be Advair?  Tobacco Abuse: Advised stop.   Other plans as per orders. Code Status: FULL Unk Lightning, MD. FACP [facility] Pager [phone] 7pm to 7am.  01/19/2016, 8:42 AM

## 2016-01-19 NOTE — ED Notes (Signed)
Nurse unable to take report at this time.

## 2016-01-20 DIAGNOSIS — J441 Chronic obstructive pulmonary disease with (acute) exacerbation: Principal | ICD-10-CM

## 2016-01-20 DIAGNOSIS — R0789 Other chest pain: Secondary | ICD-10-CM

## 2016-01-20 DIAGNOSIS — J9601 Acute respiratory failure with hypoxia: Secondary | ICD-10-CM

## 2016-01-20 LAB — BASIC METABOLIC PANEL
ANION GAP: 10 (ref 5–15)
BUN: 15 mg/dL (ref 6–20)
CALCIUM: 9.9 mg/dL (ref 8.9–10.3)
CHLORIDE: 105 mmol/L (ref 101–111)
CO2: 23 mmol/L (ref 22–32)
Creatinine, Ser: 1.02 mg/dL (ref 0.61–1.24)
GFR calc non Af Amer: >60 mL/min (ref >60)
Glucose, Bld: 227 mg/dL — ABNORMAL HIGH (ref 65–99)
POTASSIUM: 4.1 mmol/L (ref 3.5–5.1)
SODIUM: 138 mmol/L (ref 135–145)

## 2016-01-20 LAB — CBC
HCT: 50.2 % (ref 39.0–52.0)
Hemoglobin: 16.6 g/dL (ref 13.0–17.0)
MCH: 30.7 pg (ref 26.0–34.0)
MCHC: 33.1 g/dL (ref 30.0–36.0)
MCV: 92.8 fL (ref 78.0–100.0)
PLATELETS: 284 10*3/uL (ref 150–400)
RBC: 5.41 MIL/uL (ref 4.22–5.81)
RDW: 13.7 % (ref 11.5–15.5)
WBC: 28.4 10*3/uL — AB (ref 4.0–10.5)

## 2016-01-20 LAB — LIPID PANEL
CHOL/HDL RATIO: 3.3 ratio
CHOLESTEROL: 189 mg/dL (ref 0–200)
HDL: 57 mg/dL (ref >40)
LDL Cholesterol: 115 mg/dL — ABNORMAL HIGH (ref 0–99)
Triglycerides: 84 mg/dL (ref <150)
VLDL: 17 mg/dL (ref 0–40)

## 2016-01-20 MED ORDER — DOXYCYCLINE HYCLATE 100 MG PO TABS
100.0000 mg | ORAL_TABLET | Freq: Two times a day (BID) | ORAL | Status: DC
  Administered 2016-01-20 – 2016-01-21 (×3): 100 mg via ORAL
  Filled 2016-01-20 (×3): qty 1

## 2016-01-20 MED ORDER — TIOTROPIUM BROMIDE MONOHYDRATE 18 MCG IN CAPS
18.0000 ug | ORAL_CAPSULE | Freq: Every day | RESPIRATORY_TRACT | Status: DC
  Administered 2016-01-20: 18 ug via RESPIRATORY_TRACT
  Filled 2016-01-20: qty 5

## 2016-01-20 NOTE — Progress Notes (Signed)
[facility] PROGRESS NOTE    Progress Note   Daniel Hurley ZOX:[id number] DOB: 1956/01/10 DOA: 01/19/2016 PCP: [name], MD   Brief Narrative:   Daniel Hurley is an 60 y.o. male 60 y.o. male active smoker, hx of COPD, back surgery, but no known CAD, presented to the ER with left sided CP with coughing. He also has a chronic cough, audible wheezing, and SOB.  Assessment/Plan:   Acute respiratory failure with hypoxia due to COPD exacerbation (HCC): Initially in the ED he was hypoxic started on nasal canula O2L. Sat have remain > 88 % on RA. I agree with IV steroids and abx. He relates he still feels winded when he walks down the hall. Leukocytosis high due to steroids. Check saturations with ambulation.  Chest pain: Unlikely cardiac in origin. Cardiac markers are negative 3 EKG showed normal sinus rhythm with no T-wave abnormalities, and chest x-ray is negative. Stress test as an outpatient.  Tobacco abuse: Counseling    DVT Prophylaxis - Lovenox ordered.  Family Communication: none Disposition Plan: Home in am Code Status:     Code Status Orders        Start     Ordered   01/19/16 0840  Full code   Continuous     01/19/16 0841    Code Status History    Date Active Date Inactive Code Status Order ID Comments User Context   This patient has a current code status but no historical code status.        IV Access:    Peripheral IV   Procedures and diagnostic studies:   Dg Chest 2 View  01/18/2016  CLINICAL DATA:  COPD.  Chest pain EXAM: CHEST  2 VIEW COMPARISON:  12/12/2014 FINDINGS: Normal heart size and stable mild aortic tortuosity. Chronic hyperinflation and interstitial coarsening. There is no edema, consolidation, effusion, or pneumothorax. Healing/healed lateral left tenth rib fracture. IMPRESSION: No evidence of active disease. Electronically Signed   By: Marnee Spring M.D.   On: 01/18/2016 20:18   Ct Angio Chest Pe W/cm  &/or Wo Cm  01/19/2016  CLINICAL DATA:  Left-sided chest pain radiating to the left arm. EXAM: CT ANGIOGRAPHY CHEST WITH CONTRAST TECHNIQUE: Multidetector CT imaging of the chest was performed using the standard protocol during bolus administration of intravenous contrast. Multiplanar CT image reconstructions and MIPs were obtained to evaluate the vascular anatomy. CONTRAST:  80mL OMNIPAQUE IOHEXOL 350 MG/ML SOLN COMPARISON:  04/14/2005 FINDINGS: THORACIC INLET/BODY WALL: No acute abnormality. MEDIASTINUM: Normal heart size. No pericardial effusion. Atherosclerotic calcifications seen in the proximal LAD. Limited opacification of the aorta. No evidence of acute aortic disease. No evidence of pulmonary embolism. LUNG WINDOWS: Diffuse marked airway thickening. Mild scattered atelectasis. There is no edema, consolidation, effusion, or pneumothorax. UPPER ABDOMEN: No acute findings.  Left nephrolithiasis. OSSEOUS: No acute fracture.  No suspicious lytic or blastic lesions. Review of the MIP images confirms the above findings. IMPRESSION: 1. No evidence of pulmonary embolism. 2. Bronchitis. 3. Coronary atherosclerosis. Electronically Signed   By: Marnee Spring M.D.   On: 01/19/2016 05:49     Medical Consultants:    None.  Anti-Infectives:   Anti-infectives    Start     Dose/Rate Route Frequency Ordered Stop   01/19/16 0900  levofloxacin (LEVAQUIN) IVPB 750 mg     750 mg 100 mL/hr over 90 Minutes Intravenous Every 24 hours 01/19/16 0841        Subjective:    Daniel Garbe  Willa Hurley relates his breathing is improved to just feel mildly dyspneic when he ambulates.  Objective:    Filed Vitals:   01/19/16 2020 01/20/16 0020 01/20/16 0501 01/20/16 0505  BP: 136/66 134/77  129/81  Pulse: 78 67  71  Temp: 98.1 F (36.7 C) 98.1 F (36.7 C)  97.6 F (36.4 C)  TempSrc: Oral Oral  Oral  Resp: 16 18  18   Height:      Weight:   84.777 kg (186 lb 14.4 oz)   SpO2: 92% 92%  95%    Intake/Output  Summary (Last 24 hours) at 01/20/16 0719 Last data filed at 01/20/16 0200  Gross per 24 hour  Intake    720 ml  Output   1000 ml  Net   -280 ml   Filed Weights   01/18/16 2043 01/19/16 0900 01/20/16 0501  Weight: 87.907 kg (193 lb 12.8 oz) 85.412 kg (188 lb 4.8 oz) 84.777 kg (186 lb 14.4 oz)    Exam: Gen:  NAD Cardiovascular:  RRR, No M/R/G Chest and lungs:   Good air movement and wheezing bilaterally. Abdomen:  Abdomen soft, NT/ND, + BS Extremities:  No edema   Data Reviewed:    Labs: Basic Metabolic Panel:  Recent Labs Lab 01/18/16 2052 01/19/16 1055  NA 137  --   K 4.0  --   CL 102  --   CO2 22  --   GLUCOSE 130*  --   BUN 13  --   CREATININE 1.03 0.92  CALCIUM 9.4  --    GFR Estimated Creatinine Clearance: 89.4 mL/min (by C-G formula based on Cr of 0.92). Liver Function Tests: No results for input(s): AST, ALT, ALKPHOS, BILITOT, PROT, ALBUMIN in the last 168 hours. No results for input(s): LIPASE, AMYLASE in the last 168 hours. No results for input(s): AMMONIA in the last 168 hours. Coagulation profile No results for input(s): INR, PROTIME in the last 168 hours.  CBC:  Recent Labs Lab 01/18/16 2052 01/19/16 1055  WBC 18.2* 13.5*  HGB 16.2 15.7  HCT 48.4 47.0  MCV 91.7 92.2  PLT 259 236   Cardiac Enzymes:  Recent Labs Lab 01/19/16 0143 01/19/16 1055 01/19/16 1745 01/19/16 2016  TROPONINI <0.03 <0.03 <0.03 <0.03   BNP (last 3 results) No results for input(s): PROBNP in the last 8760 hours. CBG: No results for input(s): GLUCAP in the last 168 hours. D-Dimer:  Recent Labs  01/19/16 0143  DDIMER 0.74*   Hgb A1c: No results for input(s): HGBA1C in the last 72 hours. Lipid Profile: No results for input(s): CHOL, HDL, LDLCALC, TRIG, CHOLHDL, LDLDIRECT in the last 72 hours. Thyroid function studies:  Recent Labs  01/19/16 1055  TSH 0.731   Anemia work up: No results for input(s): VITAMINB12, FOLATE, FERRITIN, TIBC, IRON,  RETICCTPCT in the last 72 hours. Sepsis Labs:  Recent Labs Lab 01/18/16 2052 01/19/16 1055  WBC 18.2* 13.5*   Microbiology No results found for this or any previous visit (from the past 240 hour(s)).   Medications:   . albuterol  2.5 mg Nebulization TID  . aspirin  81 mg Oral Daily  . atorvastatin  40 mg Oral q1800  . enoxaparin (LOVENOX) injection  40 mg Subcutaneous Q24H  . levofloxacin (LEVAQUIN) IV  750 mg Intravenous Q24H  . methylPREDNISolone (SOLU-MEDROL) injection  40 mg Intravenous Q6H  . metoprolol tartrate  25 mg Oral BID  . pneumococcal 23 valent vaccine  0.5 mL Intramuscular Tomorrow-1000   Continuous Infusions:  Time spent: 25 min   LOS: 1 day   Marinda Elk  [facility] Pager [phone]  *Please refer to [email or web address], password [email or web address] to get updated schedule on who will round on this patient, as hospitalists switch teams weekly. If 7PM-7AM, please contact night-coverage at [email or web address], password [email or web address] for any overnight needs.  01/20/2016, 7:19 AM

## 2016-01-21 DIAGNOSIS — Z720 Tobacco use: Secondary | ICD-10-CM

## 2016-01-21 MED ORDER — ALBUTEROL SULFATE HFA 108 (90 BASE) MCG/ACT IN AERS: 2.0000 | INHALATION_SPRAY | Freq: Four times a day (QID) | RESPIRATORY_TRACT | Status: DC | PRN

## 2016-01-21 MED ORDER — DOXYCYCLINE HYCLATE 100 MG PO TABS: 100.0000 mg | ORAL_TABLET | Freq: Two times a day (BID) | ORAL | Status: DC

## 2016-01-21 MED ORDER — TIOTROPIUM BROMIDE MONOHYDRATE 18 MCG IN CAPS: 18.0000 ug | ORAL_CAPSULE | Freq: Every day | RESPIRATORY_TRACT | Status: DC

## 2016-01-21 MED ORDER — ATORVASTATIN CALCIUM 40 MG PO TABS: 40.0000 mg | ORAL_TABLET | Freq: Every day | ORAL | Status: DC

## 2016-01-21 MED ORDER — PREDNISONE 10 MG PO TABS: ORAL_TABLET | ORAL | Status: DC

## 2016-01-21 MED ORDER — METOPROLOL TARTRATE 25 MG PO TABS: 25.0000 mg | ORAL_TABLET | Freq: Two times a day (BID) | ORAL | Status: DC

## 2016-01-21 NOTE — Discharge Summary (Signed)
Physician Discharge Summary  Daniel Hurley UJW:[id number] DOB: 21-Oct-1956 DOA: 01/19/2016  PCP: [name], MD  Admit date: 01/19/2016 Discharge date: 01/21/2016  Time spent: 35 minutes  Recommendations for Outpatient Follow-up:  1. Follow with PCP in 2-4 weeks will continue counseling about smoking cessation.   Discharge Diagnoses:  Principal Problem:   COPD exacerbation (HCC) Active Problems:   Chest pain   Tobacco abuse   Acute respiratory failure with hypoxia [facility])   Discharge Condition:Stable  Diet recommendation: Heart healthy  Filed Weights   01/19/16 0900 01/20/16 0501 01/21/16 0431  Weight: 85.412 kg (188 lb 4.8 oz) 84.777 kg (186 lb 14.4 oz) 85.911 kg (189 lb 6.4 oz)    History of present illness:  60 y/o male with PMH of COPD and back surgery come sin with left sided CP cough and progressive SOB with ambulation.  Hospital Course:  Acute respiratory failure with hypoxia due to COPD exacerbation: He was found to be hypoxic in the ED, was started on empiric IV steroids antibiotics and inhalers, and shortness of breath improved. He was able to ambulate without desaturating. He will continue the steroid taper and antibiotics for 5 additional days. He was started on Spiriva which she will continue as an outpatient.  Chest pain: Likely due to COPD exacerbation cardiac markers were negative 3 EKG shows sinus rhythm with no T-wave abnormalities.  Tobacco abuse: Counseling was performed.  Procedures:  CT angio of chest  CXR  Consultations:  none  Discharge Exam: Filed Vitals:   01/20/16 2043 01/21/16 0114  BP: 117/69 108/61  Pulse: 87 71  Temp: 98 F (36.7 C) 98.2 F (36.8 C)  Resp: 18 18    General: A&O x Cardiovascular: RRR Respiratory: good air movement CTA B/L  Discharge Instructions   Discharge Instructions    Diet - low sodium heart healthy    Complete by:  As directed      Increase activity slowly    Complete by:  As  directed           Current Discharge Medication List    START taking these medications   Details  albuterol (PROVENTIL HFA;VENTOLIN HFA) 108 (90 Base) MCG/ACT inhaler Inhale 2 puffs into the lungs every 6 (six) hours as needed for wheezing or shortness of breath. Qty: 1 Inhaler, Refills: 2    atorvastatin (LIPITOR) 40 MG tablet Take 1 tablet (40 mg total) by mouth daily at 6 PM. Qty: 30 tablet, Refills: 3    doxycycline (VIBRA-TABS) 100 MG tablet Take 1 tablet (100 mg total) by mouth every 12 (twelve) hours. Qty: 10 tablet, Refills: 0    tiotropium (SPIRIVA) 18 MCG inhalation capsule Place 1 capsule (18 mcg total) into inhaler and inhale daily. Qty: 30 capsule, Refills: 12      CONTINUE these medications which have NOT CHANGED   Details  aspirin 81 MG chewable tablet Chew 81 mg by mouth daily.    HYDROcodone-acetaminophen (NORCO) 7.5-325 MG tablet Take 1 tablet by mouth 3 (three) times daily as needed for moderate pain.     sildenafil (REVATIO) 20 MG tablet Take 20 mg by mouth 2 (two) times daily.       No Known Allergies Follow-up Information    Follow up with ROBERTS, [name], MD In 2 weeks.   Specialty:  Internal Medicine   Why:  hospital follow up in 2 weeks.   Contact information:   [address] [place] [address] [phone]  The results of significant diagnostics from this hospitalization (including imaging, microbiology, ancillary and laboratory) are listed below for reference.    Significant Diagnostic Studies: Dg Chest 2 View  01/18/2016  CLINICAL DATA:  COPD.  Chest pain EXAM: CHEST  2 VIEW COMPARISON:  12/12/2014 FINDINGS: Normal heart size and stable mild aortic tortuosity. Chronic hyperinflation and interstitial coarsening. There is no edema, consolidation, effusion, or pneumothorax. Healing/healed lateral left tenth rib fracture. IMPRESSION: No evidence of active disease. Electronically Signed   By: Marnee SpringJonathon  Watts M.D.   On:  01/18/2016 20:18   Ct Angio Chest Pe W/cm &/or Wo Cm  01/19/2016  CLINICAL DATA:  Left-sided chest pain radiating to the left arm. EXAM: CT ANGIOGRAPHY CHEST WITH CONTRAST TECHNIQUE: Multidetector CT imaging of the chest was performed using the standard protocol during bolus administration of intravenous contrast. Multiplanar CT image reconstructions and MIPs were obtained to evaluate the vascular anatomy. CONTRAST:  80mL OMNIPAQUE IOHEXOL 350 MG/ML SOLN COMPARISON:  04/14/2005 FINDINGS: THORACIC INLET/BODY WALL: No acute abnormality. MEDIASTINUM: Normal heart size. No pericardial effusion. Atherosclerotic calcifications seen in the proximal LAD. Limited opacification of the aorta. No evidence of acute aortic disease. No evidence of pulmonary embolism. LUNG WINDOWS: Diffuse marked airway thickening. Mild scattered atelectasis. There is no edema, consolidation, effusion, or pneumothorax. UPPER ABDOMEN: No acute findings.  Left nephrolithiasis. OSSEOUS: No acute fracture.  No suspicious lytic or blastic lesions. Review of the MIP images confirms the above findings. IMPRESSION: 1. No evidence of pulmonary embolism. 2. Bronchitis. 3. Coronary atherosclerosis. Electronically Signed   By: Marnee SpringJonathon  Watts M.D.   On: 01/19/2016 05:49    Microbiology: No results found for this or any previous visit (from the past 240 hour(s)).   Labs: Basic Metabolic Panel:  Recent Labs Lab 01/18/16 2052 01/19/16 1055 01/20/16 0620  NA 137  --  138  K 4.0  --  4.1  CL 102  --  105  CO2 22  --  23  GLUCOSE 130*  --  227*  BUN 13  --  15  CREATININE 1.03 0.92 1.02  CALCIUM 9.4  --  9.9   Liver Function Tests: No results for input(s): AST, ALT, ALKPHOS, BILITOT, PROT, ALBUMIN in the last 168 hours. No results for input(s): LIPASE, AMYLASE in the last 168 hours. No results for input(s): AMMONIA in the last 168 hours. CBC:  Recent Labs Lab 01/18/16 2052 01/19/16 1055 01/20/16 0620  WBC 18.2* 13.5* 28.4*  HGB  16.2 15.7 16.6  HCT 48.4 47.0 50.2  MCV 91.7 92.2 92.8  PLT 259 236 284   Cardiac Enzymes:  Recent Labs Lab 01/19/16 0143 01/19/16 1055 01/19/16 1745 01/19/16 2016  TROPONINI <0.03 <0.03 <0.03 <0.03   BNP: BNP (last 3 results) No results for input(s): BNP in the last 8760 hours.  ProBNP (last 3 results) No results for input(s): PROBNP in the last 8760 hours.  CBG: No results for input(s): GLUCAP in the last 168 hours.     Signed:  Marinda ElkFELIZ ORTIZ, Jermain Curt MD.  [facility] 01/21/2016, 9:06 AM

## 2016-01-21 NOTE — Discharge Summary (Signed)
Pt got discharged to home, discharge instructions provided and patient showed understanding to it, IV taken out,Telemonitor DC,pt left unit ambulation with all of the belongings.

## 2016-01-22 MED ORDER — PREDNISONE 10 MG PO TABS: ORAL_TABLET | ORAL | Status: DC

## 2017-06-13 IMAGING — CR DG CHEST 2V
2 series · 2 of 2 positions shown · non-contrast
Comparison: 12/12/2014

CLINICAL DATA: COPD.  Chest pain

EXAM:
CHEST  2 VIEW

[chest pa]
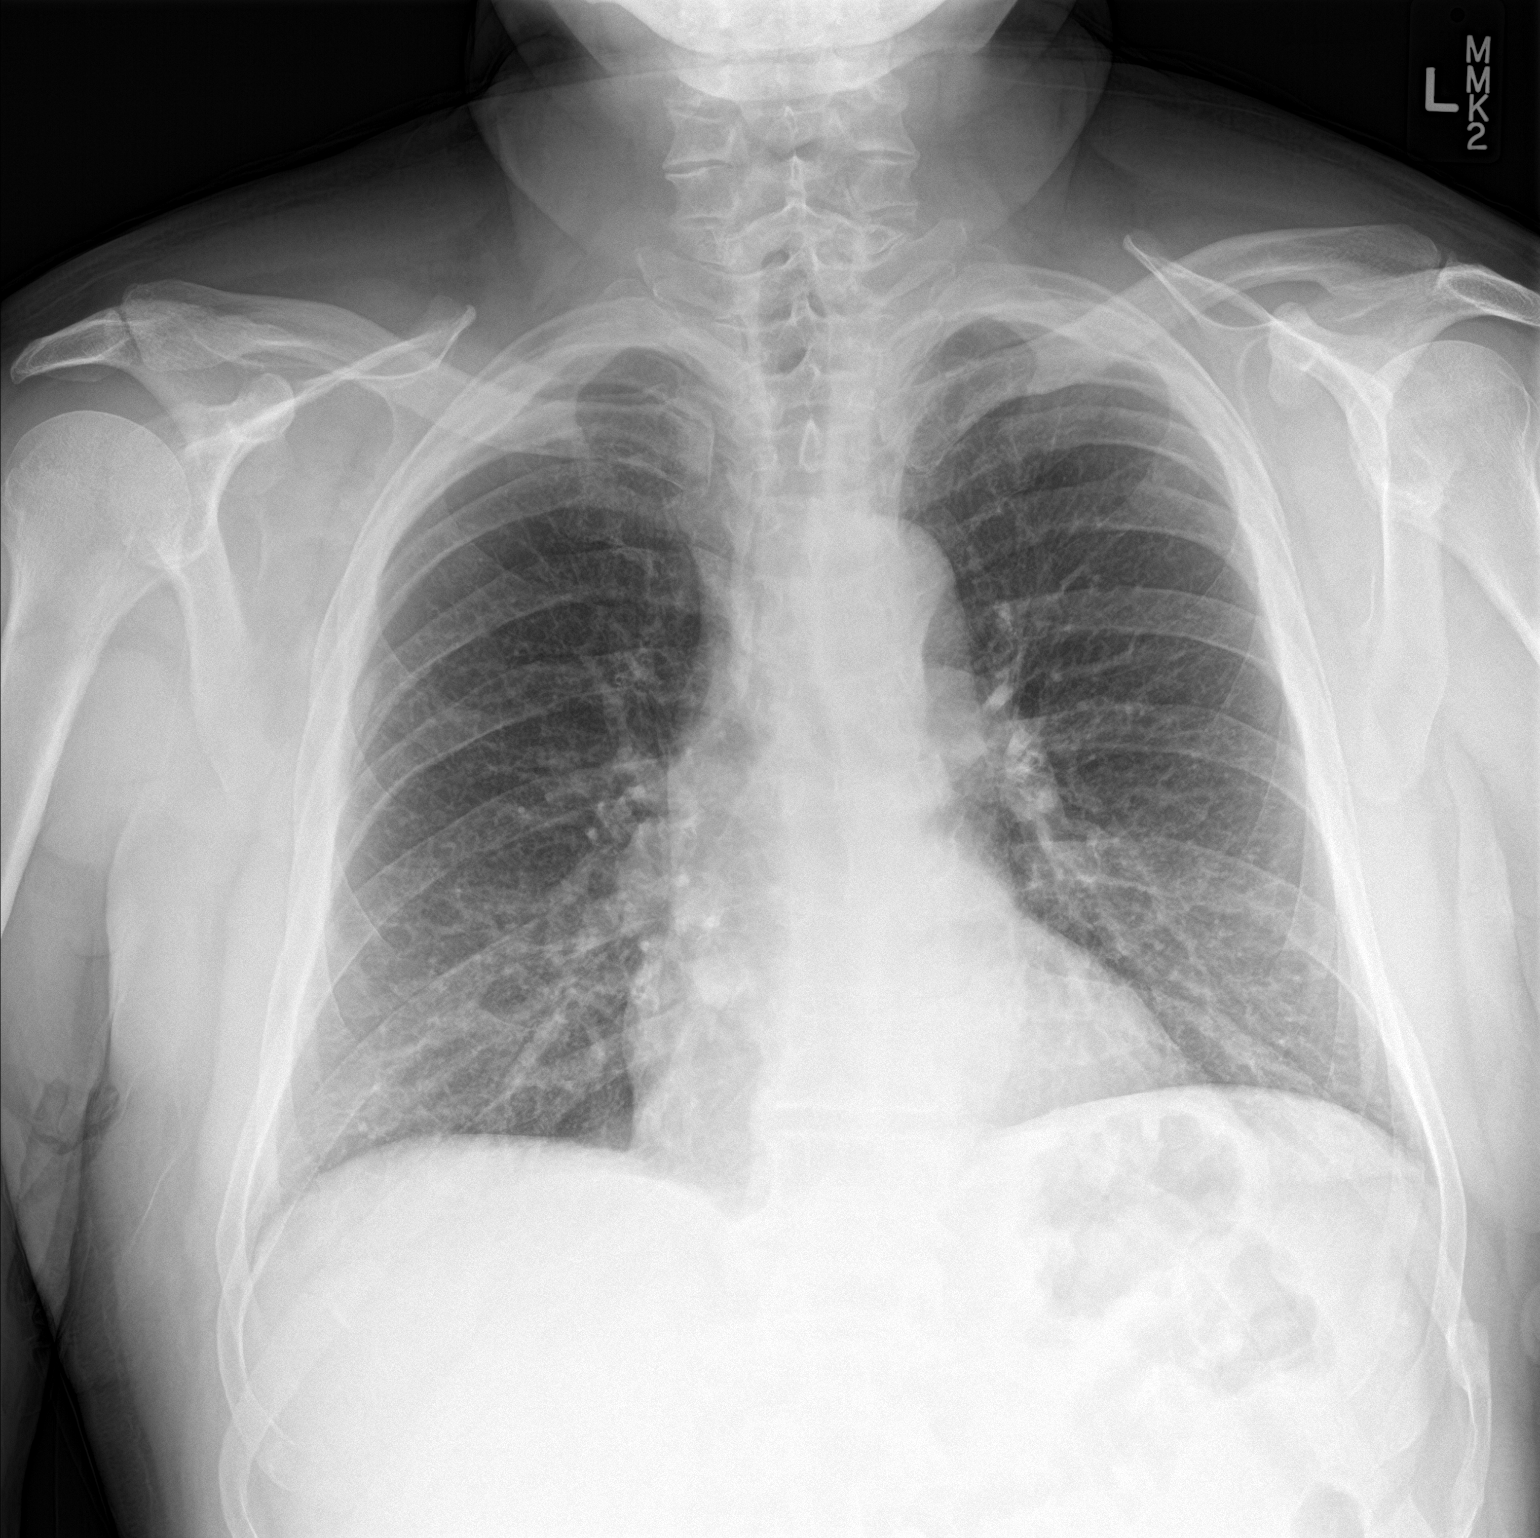

[chest lat]
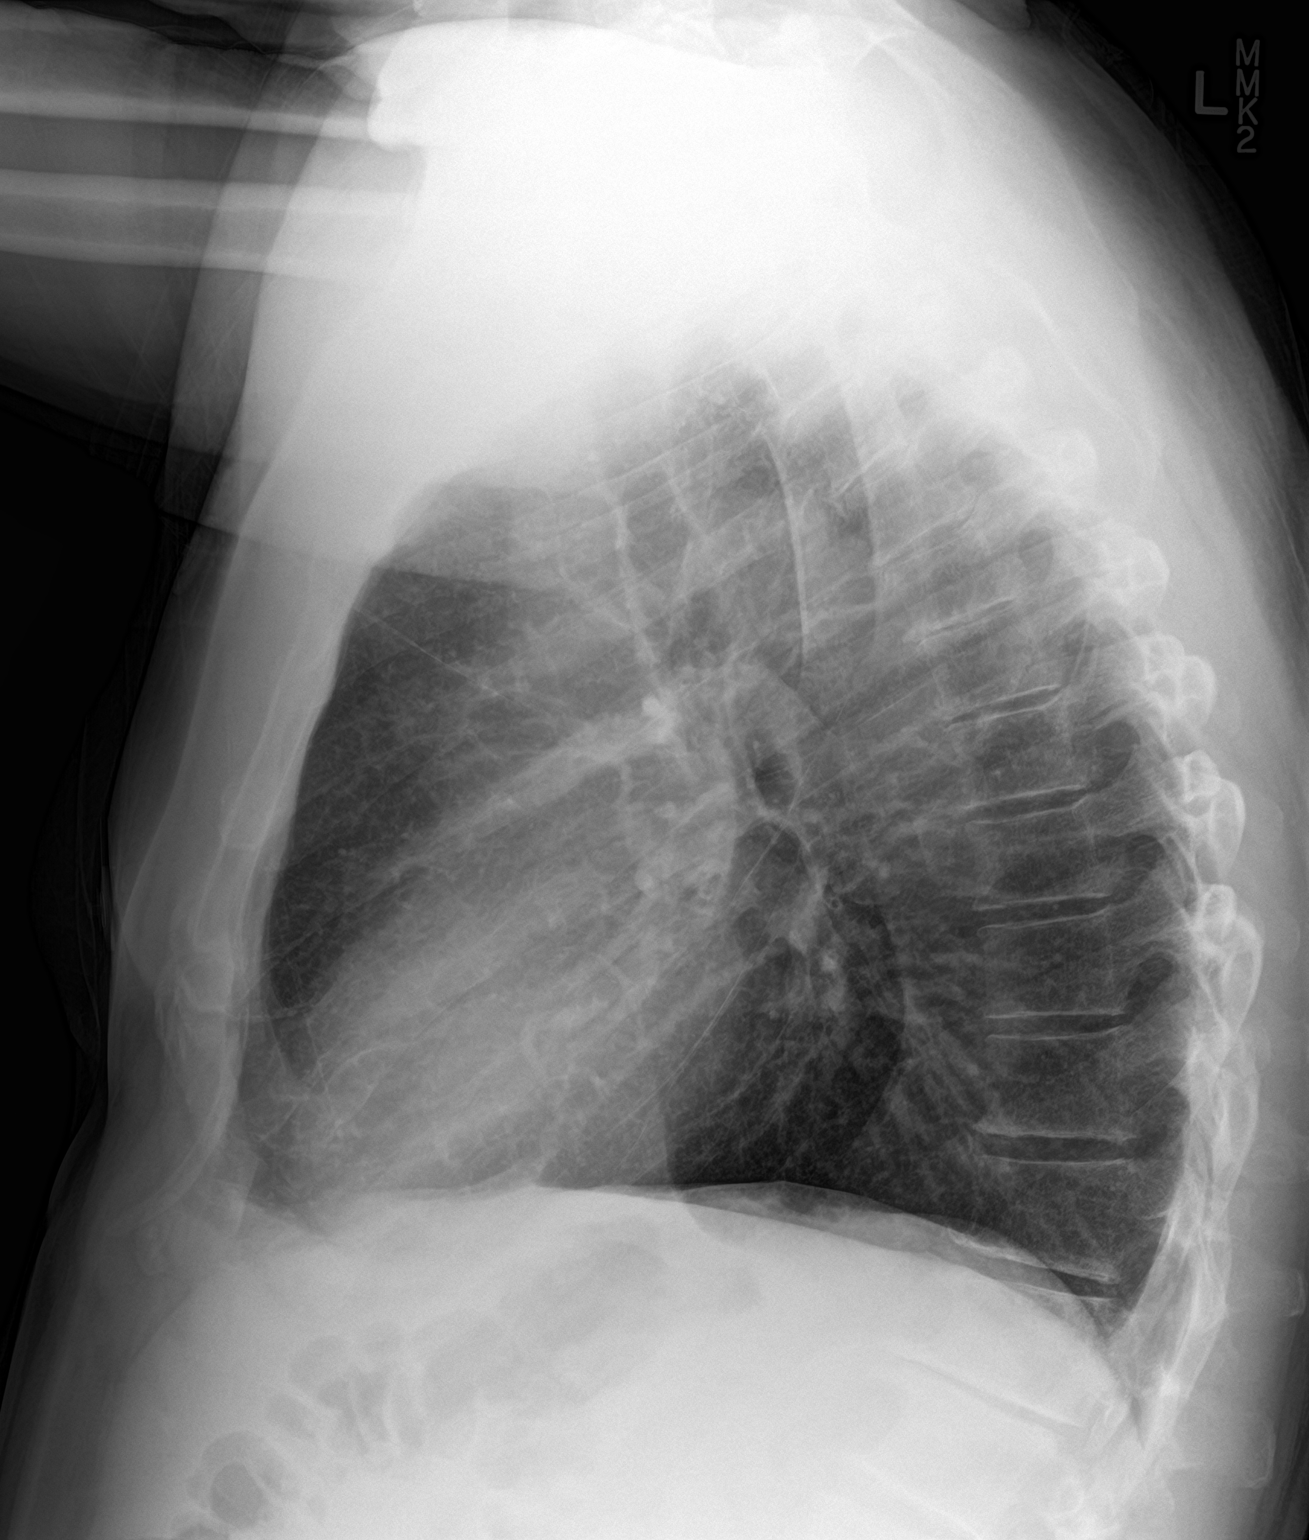

[2 of 2 positions shown; findings below may reference images not displayed]

FINDINGS: Normal heart size and stable mild aortic tortuosity. Chronic
hyperinflation and interstitial coarsening. There is no edema,
consolidation, effusion, or pneumothorax. Healing/healed lateral
left tenth rib fracture.
IMPRESSION: No evidence of active disease.

## 2017-06-14 IMAGING — CT CT ANGIO CHEST
2 of 7 series · 18 of 36 positions shown · IV contrast (omnipaque)
Comparison: 04/14/2005

CLINICAL DATA: Left-sided chest pain radiating to the left arm.

EXAM:
CT ANGIOGRAPHY CHEST WITH CONTRAST
TECHNIQUE: Multidetector CT imaging of the chest was performed using the
standard protocol during bolus administration of intravenous
contrast. Multiplanar CT image reconstructions and MIPs were
obtained to evaluate the vascular anatomy.
CONTRAST:  80mL OMNIPAQUE IOHEXOL 350 MG/ML SOLN

[Series 6: pe thins · axial · 0.70mm/px · z∈[+1190,+1448]mm · 17 of 582 slices shown]
[im 33/582  lung]
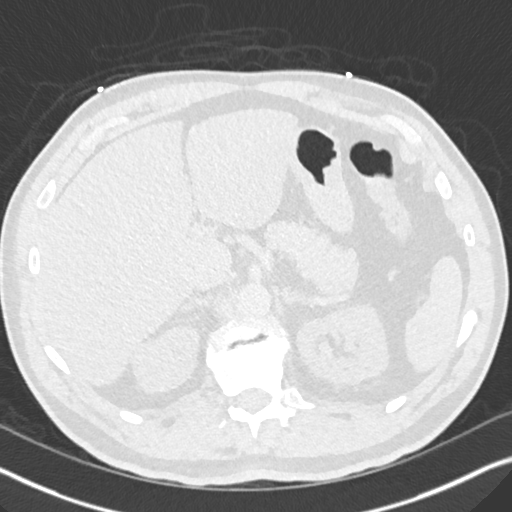
[im 65/582  mediastinal]
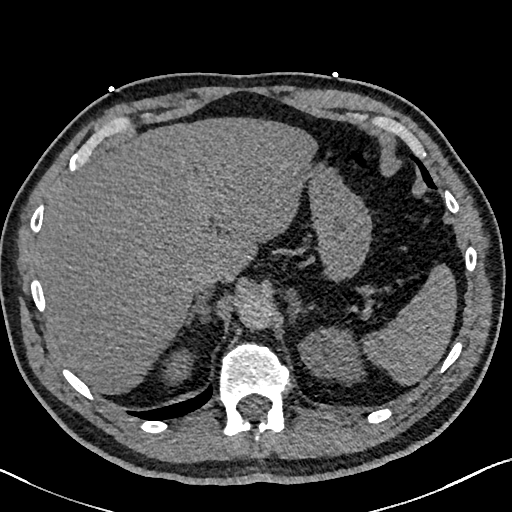
[im 97/582  lung]
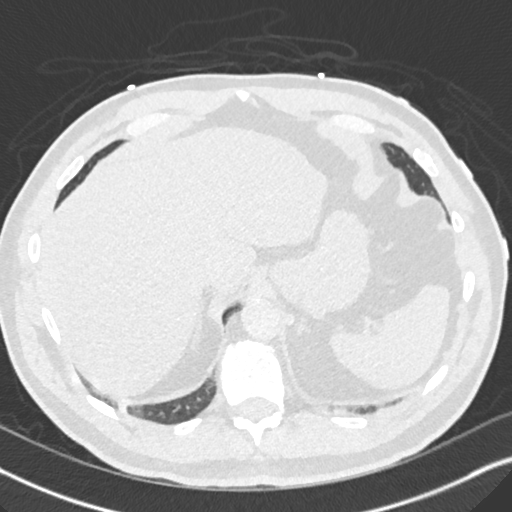
[im 130/582  mediastinal]
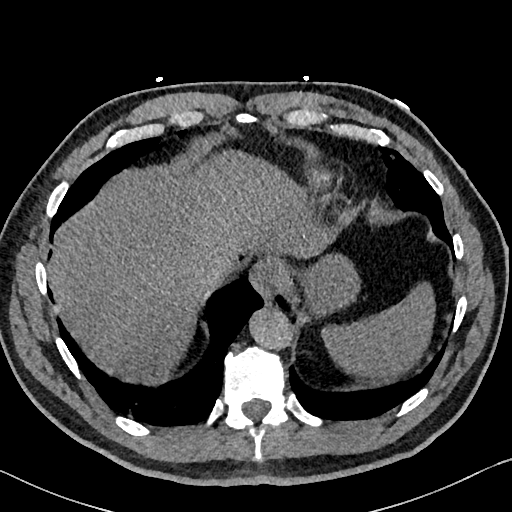
[im 162/582  lung]
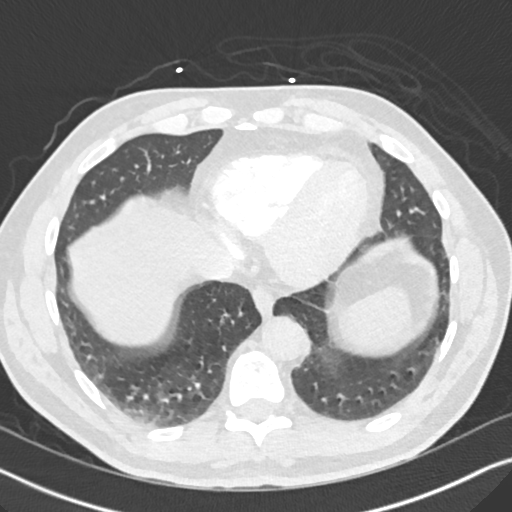
[im 194/582  mediastinal]
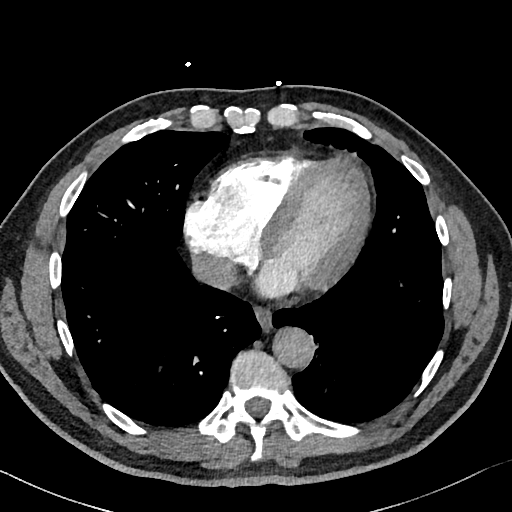
[im 226/582  lung]
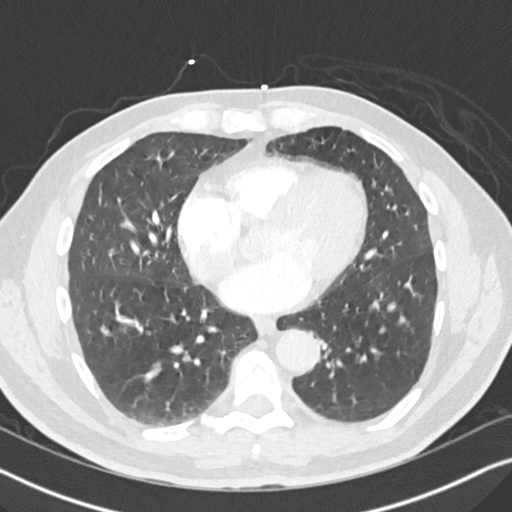
[im 259/582  mediastinal]
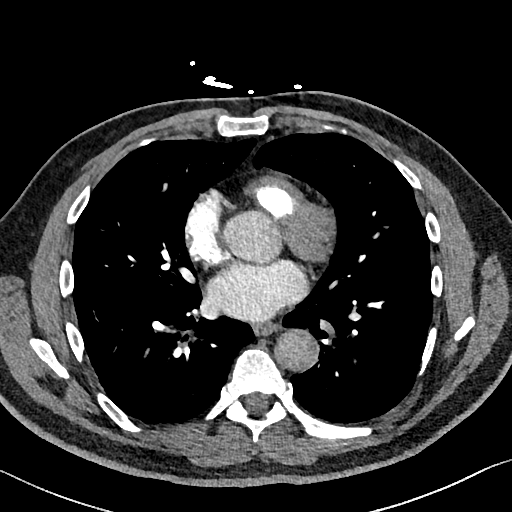
[im 291/582  lung]
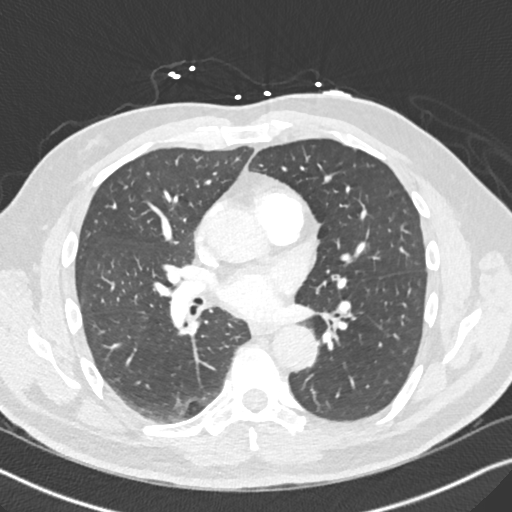
[im 323/582  mediastinal]
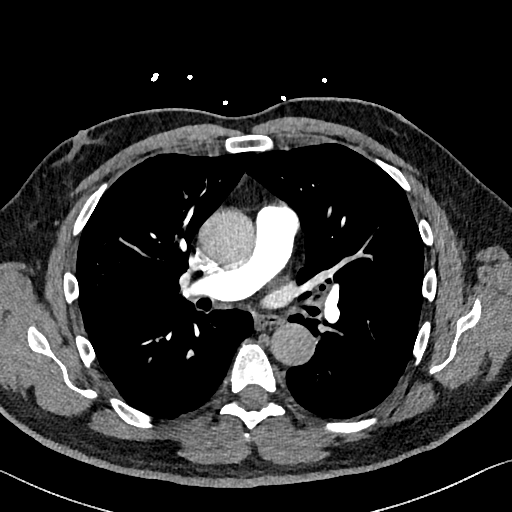
[im 356/582  lung]
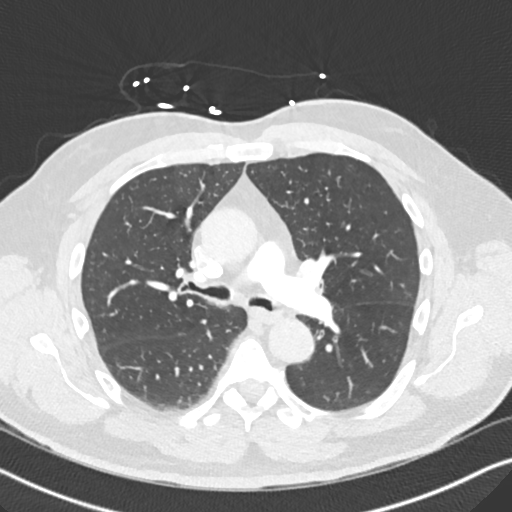
[im 388/582  mediastinal]
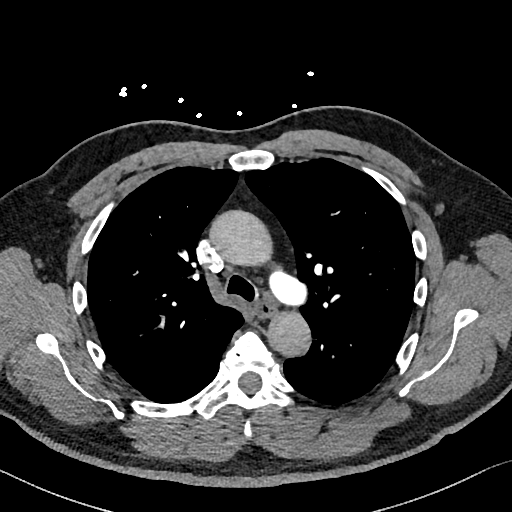
[im 420/582  lung]
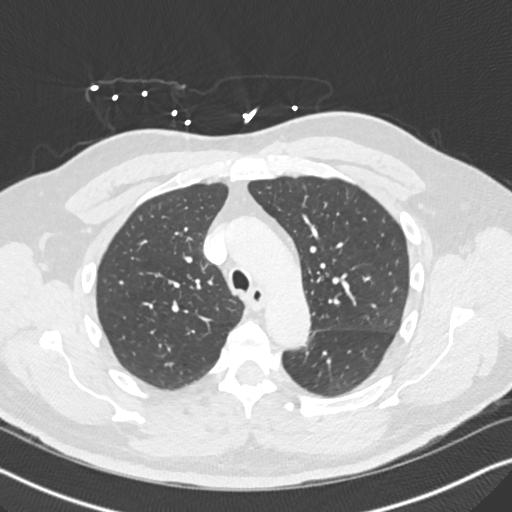
[im 452/582  mediastinal]
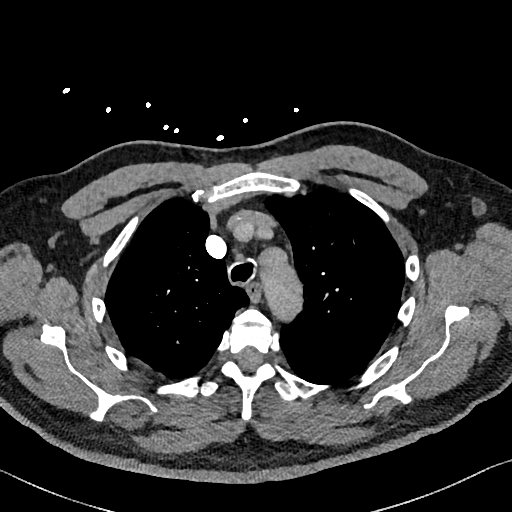
[im 485/582  lung]
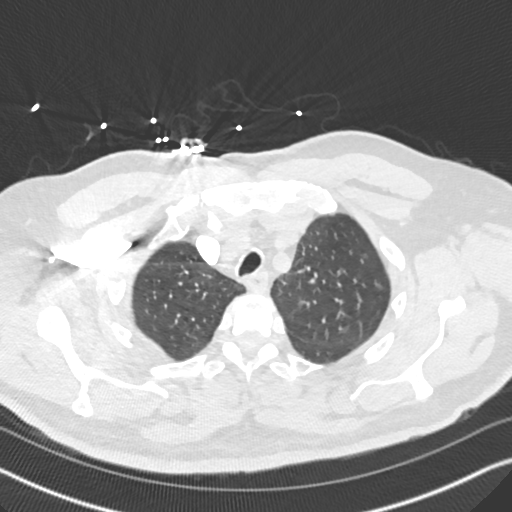
[im 517/582  mediastinal]
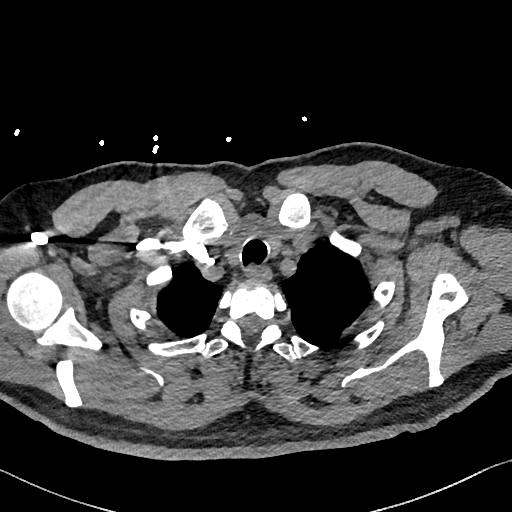
[im 549/582  lung]
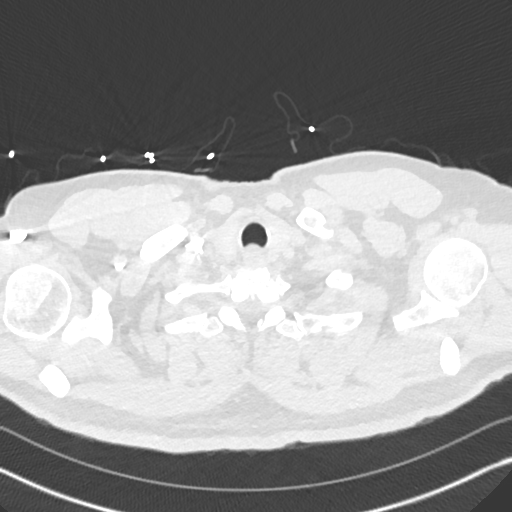

[Series 7: pe 2mm cor · coronal · 0.59mm/px · 1 of 127 slices shown]
[im 64/127  mediastinal]
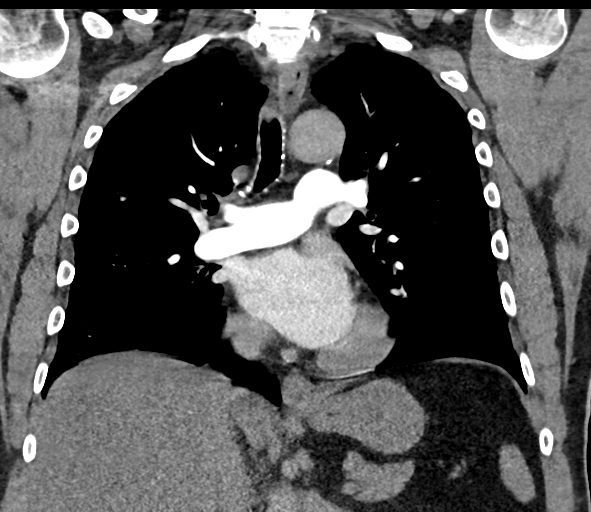

[18 of 36 positions shown; findings below may reference images not displayed]

FINDINGS: THORACIC INLET/BODY WALL:

No acute abnormality.

MEDIASTINUM:

Normal heart size. No pericardial effusion. Atherosclerotic
calcifications seen in the proximal LAD. Limited opacification of
the aorta. No evidence of acute aortic disease. No evidence of
pulmonary embolism.

LUNG WINDOWS:

Diffuse marked airway thickening. Mild scattered atelectasis. There
is no edema, consolidation, effusion, or pneumothorax.

UPPER ABDOMEN:

No acute findings.  Left nephrolithiasis.

OSSEOUS:

No acute fracture.  No suspicious lytic or blastic lesions.

Review of the MIP images confirms the above findings.
IMPRESSION: 1. No evidence of pulmonary embolism.
2. Bronchitis.
3. Coronary atherosclerosis.

## 2018-07-05 ENCOUNTER — Ambulatory Visit (INDEPENDENT_AMBULATORY_CARE_PROVIDER_SITE_OTHER): Payer: Self-pay | Admitting: Family Medicine

## 2018-07-05 ENCOUNTER — Encounter (INDEPENDENT_AMBULATORY_CARE_PROVIDER_SITE_OTHER): Payer: Self-pay | Admitting: Family Medicine

## 2018-07-05 ENCOUNTER — Ambulatory Visit (INDEPENDENT_AMBULATORY_CARE_PROVIDER_SITE_OTHER): Payer: 59

## 2018-07-05 VITALS — BP 154/91 | HR 79

## 2018-07-05 DIAGNOSIS — R059 Cough, unspecified: Secondary | ICD-10-CM

## 2018-07-05 DIAGNOSIS — R05 Cough: Secondary | ICD-10-CM

## 2018-07-05 MED ORDER — AMOXICILLIN-POT CLAVULANATE 875-125 MG PO TABS: 1.0000 | ORAL_TABLET | Freq: Two times a day (BID) | ORAL | 0 refills | Status: DC

## 2018-07-05 MED ORDER — PREDNISONE 10 MG PO TABS: ORAL_TABLET | ORAL | 0 refills | Status: DC

## 2018-07-05 NOTE — Progress Notes (Signed)
   Office Visit Note   Patient: Daniel Hurley           Date of Birth: [birth year]           MRN: [id number] Visit Date: 07/05/2018 Requested by: Burton Apleyoberts, Ronald, MD [address] [place], [place] [id number] PCP: Burton Apleyoberts, Ronald, MD  Subjective: Chief Complaint  Patient presents with  . chest congestion x 3 months    HPI: He is here with a cough.  He has had chest congestion for the past few months.  No fever or chills, no shortness of breath.  He has a history of pneumonia and he feels like he is getting close to that.  He had to be hospitalized for a few days.              ROS: Otherwise noncontributory  Objective: Vital Signs: BP (!) 154/91 (BP Location: Left Arm, Patient Position: Sitting, Cuff Size: Normal)   Pulse 79   Physical Exam:  HEENT:  [place]/AT, PERRLA, EOM Full, no nystagmus.  Funduscopic examination within normal limits.  No conjunctival erythema.  Tympanic membranes are pearly gray with normal landmarks.  External ear canals are normal.  Nasal passages are clear.  Oropharynx is clear.  No significant lymphadenopathy.  No thyromegaly or nodules.  2+ carotid pulses without bruits. CV: Regular rate and rhythm without murmurs, rubs, or gallops.  No peripheral edema.  2+ radial and posterior tibial pulses. Lungs: Clear to auscultation throughout with no wheezing or areas of consolidation.    Imaging: Chest x-ray: Lung fields look clear, no areas of consolidation.  Assessment & Plan: 1.  Chest congestion and cough -We will treat with prednisone and Augmentin.  CT scan if not improving.   Follow-Up Instructions: No follow-ups on file.     Procedures: None today.   PMFS History: Patient Active Problem List   Diagnosis Date Noted  . Acute respiratory failure with hypoxia (HCC) 01/20/2016  . Chest pain 01/19/2016  . COPD exacerbation (HCC) 01/19/2016  . Tobacco abuse 01/19/2016   Past Medical History:  Diagnosis Date  . COPD (chronic obstructive pulmonary  disease) (HCC)     No family history on file.  Past Surgical History:  Procedure Laterality Date  . ANKLE FUSION  2006  . BACK SURGERY  2008   L5-5 fusion   . left wrist surgery     Social History   Occupational History  . Not on file  Tobacco Use  . Smoking status: Current Every Day Smoker    Packs/day: 1.50    Types: Cigarettes  Substance and Sexual Activity  . Alcohol use: No  . Drug use: No  . Sexual activity: Not on file

## 2018-08-30 ENCOUNTER — Ambulatory Visit (INDEPENDENT_AMBULATORY_CARE_PROVIDER_SITE_OTHER): Payer: 59 | Admitting: Family Medicine

## 2018-08-30 ENCOUNTER — Encounter (INDEPENDENT_AMBULATORY_CARE_PROVIDER_SITE_OTHER): Payer: Self-pay | Admitting: Family Medicine

## 2018-08-30 VITALS — BP 140/94 | HR 79 | Temp 98.4°F

## 2018-08-30 DIAGNOSIS — R0609 Other forms of dyspnea: Secondary | ICD-10-CM

## 2018-08-30 DIAGNOSIS — R05 Cough: Secondary | ICD-10-CM

## 2018-08-30 DIAGNOSIS — R0789 Other chest pain: Secondary | ICD-10-CM | POA: Diagnosis not present

## 2018-08-30 DIAGNOSIS — R0600 Dyspnea, unspecified: Secondary | ICD-10-CM

## 2018-08-30 DIAGNOSIS — R059 Cough, unspecified: Secondary | ICD-10-CM

## 2018-08-30 MED ORDER — TRIAMCINOLONE ACETONIDE 0.5 % EX CREA: 1.0000 "application " | TOPICAL_CREAM | Freq: Two times a day (BID) | CUTANEOUS | 6 refills | Status: DC | PRN

## 2018-08-30 NOTE — Progress Notes (Signed)
   Office Visit Note   Patient: Daniel Hurley           Date of Birth: 1956/07/13           MRN: [id number] Visit Date: 08/30/2018 Requested by: Burton Apley, MD [address], [address] [place], [place] [address] PCP: Burton Apley, MD  Subjective: Chief Complaint  Patient presents with  . chest congestion/cough has worsened    HPI: He's here with persistent cough.  Prednisone and augmentin barely helped.  Cough keeps him awake.  No definite fever, but feels just like when he had pneumonia.  Also having exertional chest pain and dyspnea for past couple weeks.  Had EKG last week at physical, was told it was normal.  Using inhaler chronically.               ROS: Otherwise noncontributory.  Objective: Vital Signs: BP (!) 140/94 (BP Location: Left Arm, Patient Position: Sitting, Cuff Size: Normal)   Pulse 79   Temp 98.4 F (36.9 C)   SpO2 99%   Physical Exam:  HEENT:  Ears clear, no lymphadenopathy. Chest:  CTA bilaterally. CV:  RRR w/o MRG Ext:  No edema   Imaging: None  Assessment & Plan: 1.  Persistent cough, with DOE and chest pain - CT to rule out pneumonia. - Stress echo to rule out CAD, CHF. - We will possibly change inhalers if studies are unrevealing.   Follow-Up Instructions: No follow-ups on file.       Procedures: None   PMFS History: Patient Active Problem List   Diagnosis Date Noted  . Acute respiratory failure with hypoxia (HCC) 01/20/2016  . Chest pain 01/19/2016  . COPD exacerbation (HCC) 01/19/2016  . Tobacco abuse 01/19/2016   Past Medical History:  Diagnosis Date  . COPD (chronic obstructive pulmonary disease) (HCC)     History reviewed. No pertinent family history.  Past Surgical History:  Procedure Laterality Date  . ANKLE FUSION  2006  . BACK SURGERY  2008   L5-5 fusion   . left wrist surgery     Social History   Occupational History  . Not on file  Tobacco Use  . Smoking status: Current Every Day Smoker    Packs/day:  1.50    Types: Cigarettes  Substance and Sexual Activity  . Alcohol use: No  . Drug use: No  . Sexual activity: Not on file

## 2018-09-05 ENCOUNTER — Other Ambulatory Visit: Payer: Commercial Managed Care - HMO

## 2018-09-07 ENCOUNTER — Ambulatory Visit
Admission: RE | Admit: 2018-09-07 | Discharge: 2018-09-07 | Disposition: A | Discharge status: Home / Self Care | Payer: 59 | Source: Ambulatory Visit | Attending: Family Medicine | Admitting: Family Medicine

## 2018-09-07 DIAGNOSIS — R059 Cough, unspecified: Secondary | ICD-10-CM

## 2018-09-07 DIAGNOSIS — R05 Cough: Secondary | ICD-10-CM

## 2018-09-08 ENCOUNTER — Telehealth (INDEPENDENT_AMBULATORY_CARE_PROVIDER_SITE_OTHER): Payer: Self-pay | Admitting: Family Medicine

## 2018-09-08 DIAGNOSIS — R0600 Dyspnea, unspecified: Secondary | ICD-10-CM

## 2018-09-08 DIAGNOSIS — R0609 Other forms of dyspnea: Secondary | ICD-10-CM

## 2018-09-08 DIAGNOSIS — R221 Localized swelling, mass and lump, neck: Secondary | ICD-10-CM

## 2018-09-08 NOTE — Telephone Encounter (Signed)
CT scan shows tissue swelling in the neck. Radiologist thinks it needs further evaluation, so I will request referral to ENT specialist.  CT also shows calcium build-up in the heart arteries.  This is concerning for significant plaque build-up, which could be causing some of his symptoms.  I will request cardiology referral to evaluate.  No pneumonia seen, but there is emphysema.  Very important to stop smoking.

## 2018-09-08 NOTE — Telephone Encounter (Signed)
Spoke with the patient and his wife on speaker phone, informing them of the CT results, smoking cessation instructions, and the referrals that are being made to an ENT and a cardiologist.  They were instructed to call us back if there are any further questions or concerns, as they had no further questions at the time of this call.

## 2018-09-12 NOTE — Progress Notes (Signed)
Cardiology Office Note   Date:  09/21/2018   ID:  Daniel Hurley, DOB 03-03-1956, MRN [id number]  PCP:  Burton Apley, MD  Cardiologist:   Charlton Haws, MD   No chief complaint on file.     History of Present Illness: Daniel Hurley is a 62 y.o. male who presents for consultation regarding exertional dyspnea Referred by Dr Prince Rome Reviewed his note from 08/30/18 patient complained fo persistent cough not improved with prednisone and Augmentin Complained of exertional chest pain and dyspnea as well. Chronic inhaler use A stress echo was ordered I have read and reviewed and it was normal and also no evidence of pulmonary HTN done 09/19/18 CT showed tissue asymmetry in low neck near right piriform sinus ENT/MRI assessment needed. No pneumonia noted Mild centrilobular emphysema. Some coronary artery calcium he is current smoker 1.5 ppd   Discussed smoking cessation with patches. No recurrent pains in chest   Past Medical History:  Diagnosis Date  . COPD (chronic obstructive pulmonary disease) (HCC)     Past Surgical History:  Procedure Laterality Date  . ANKLE FUSION  2006  . BACK SURGERY  2008   L5-5 fusion   . left wrist surgery       Current Outpatient Medications  Medication Sig Dispense Refill  . albuterol (PROVENTIL HFA;VENTOLIN HFA) 108 (90 Base) MCG/ACT inhaler Inhale 2 puffs into the lungs every 6 (six) hours as needed for wheezing or shortness of breath. 1 Inhaler 2  . amLODipine (NORVASC) 10 MG tablet Take 10 mg by mouth daily.  4  . amoxicillin-clavulanate (AUGMENTIN) 875-125 MG tablet Take 1 tablet by mouth 2 (two) times daily. 20 tablet 0  . aspirin 81 MG chewable tablet Chew 81 mg by mouth daily.    Marland Kitchen atorvastatin (LIPITOR) 40 MG tablet Take 1 tablet (40 mg total) by mouth daily at 6 PM. 30 tablet 3  . doxycycline (VIBRA-TABS) 100 MG tablet Take 1 tablet (100 mg total) by mouth every 12 (twelve) hours. 10 tablet 0  . HYDROcodone-acetaminophen (NORCO)  7.5-325 MG tablet Take 1 tablet by mouth 3 (three) times daily as needed for moderate pain.     . predniSONE (DELTASONE) 10 MG tablet Takes 6 tablets for 1 days, then 5 tablets for 1 days, then 4 tablets for 1 days, then 3 tablets for 1 days, then 2 tabs for 1 days, then 1 tab for 1 days, and then stop. 21 tablet 0  . predniSONE (DELTASONE) 10 MG tablet Take as directed for 12 days.  Daily dose 6,6,5,5,4,4,3,3,2,2,1,1. 42 tablet 0  . sildenafil (REVATIO) 20 MG tablet Take 20 mg by mouth 2 (two) times daily.    Marland Kitchen tiotropium (SPIRIVA) 18 MCG inhalation capsule Place 1 capsule (18 mcg total) into inhaler and inhale daily. 30 capsule 12  . triamcinolone cream (KENALOG) 0.5 % Apply 1 application topically 2 (two) times daily as needed. 45 g 6   No current facility-administered medications for this visit.     Allergies:   Patient has no known allergies.    Social History:  The patient  reports that he has been smoking cigarettes. He has been smoking about 1.50 packs per day. He has never used smokeless tobacco. He reports that he does not drink alcohol or use drugs.   Family History:  The patient's family history is not on file.    ROS:  Please see the history of present illness.   Otherwise, review of systems are positive for  none.   All other systems are reviewed and negative.    PHYSICAL EXAM: VS:  BP 116/76   Pulse 86   Ht 5\' 8"  (1.727 m)   Wt 181 lb 4 oz (82.2 kg)   SpO2 96%   BMI 27.56 kg/m  , BMI Body mass index is 27.56 kg/m. Affect appropriate Healthy:  appears stated age HEENT: normal Neck supple with no adenopathy JVP normal no bruits no thyromegaly Lungs clear with no wheezing and good diaphragmatic motion Heart:  S1/S2 no murmur, no rub, gallop or click PMI normal Abdomen: benighn, BS positve, no tenderness, no AAA no bruit.  No HSM or HJR Distal pulses intact with no bruits No edema Neuro non-focal Skin warm and dry No muscular weakness    EKG:  01/21/16 NSR  normal ECG  09/21/18 SR rate 78 normal    Recent Labs: No results found for requested labs within last 8760 hours.    Lipid Panel    Component Value Date/Time   CHOL 189 01/20/2016 0620   TRIG 84 01/20/2016 0620   HDL 57 01/20/2016 0620   CHOLHDL 3.3 01/20/2016 0620   VLDL 17 01/20/2016 0620   LDLCALC 115 (H) 01/20/2016 0620      Wt Readings from Last 3 Encounters:  09/21/18 181 lb 4 oz (82.2 kg)  01/21/16 189 lb 6.4 oz (85.9 kg)      Other studies Reviewed: Additional studies/ records that were reviewed today include: Notes from Dr Prince Rome, ECG chest CT .    ASSESSMENT AND PLAN:  1.  Dyspnea:  Related to epmphysema TTE with normal RV/LV function no diastolic function  2. Chest pain :  Non cardiac normal stress echo  3. HTN:  Well controlled.  Continue current medications and low sodium Dash type diet.   4. HLD:  Continue statin labs with primary    5. Smoking discussed initiation of 14 mg patch   Current medicines are reviewed at length with the patient today.  The patient does not have concerns regarding medicines.  The following changes have been made:  None   Labs/ tests ordered today include: TTE exercise myovue  D dimer and BNP   Orders Placed This Encounter  Procedures  . EKG 12-Lead     Disposition:   FU with Dr Prince Rome     Signed, Charlton Haws, MD  09/21/2018 3:05 PM    [facility] [address] [place], [place], [place]  [address] Phone: [phone]; Fax: [phone]

## 2018-09-15 ENCOUNTER — Telehealth (INDEPENDENT_AMBULATORY_CARE_PROVIDER_SITE_OTHER): Payer: Self-pay | Admitting: *Deleted

## 2018-09-15 NOTE — Telephone Encounter (Signed)
I called and sw pt wife and explained to her referral was faxed to Dr. Haroldine Laws and they will be giving him a call to schedule once they review. Pt wife voiced understanding and will wait for their call.

## 2018-09-15 NOTE — Telephone Encounter (Signed)
-----   Message from Lavada Mesi, MD sent at 09/13/2018  2:00 PM EST ----- Regarding: ENT referral Pt called to check status of referral.

## 2018-09-16 ENCOUNTER — Telehealth (HOSPITAL_COMMUNITY): Payer: Self-pay | Admitting: *Deleted

## 2018-09-16 NOTE — Telephone Encounter (Signed)
Attempted to reach patient to go over instructions for his upcoming test.  Voice mailbox was full so could not leave message.

## 2018-09-19 ENCOUNTER — Telehealth (INDEPENDENT_AMBULATORY_CARE_PROVIDER_SITE_OTHER): Payer: Self-pay | Admitting: Family Medicine

## 2018-09-19 ENCOUNTER — Ambulatory Visit (HOSPITAL_COMMUNITY): Payer: 59 | Attending: Cardiovascular Disease

## 2018-09-19 ENCOUNTER — Ambulatory Visit (HOSPITAL_COMMUNITY): Payer: 59

## 2018-09-19 DIAGNOSIS — R0789 Other chest pain: Secondary | ICD-10-CM | POA: Diagnosis present

## 2018-09-19 DIAGNOSIS — R0609 Other forms of dyspnea: Secondary | ICD-10-CM | POA: Diagnosis present

## 2018-09-19 NOTE — Telephone Encounter (Signed)
Echocardiogram looks good, no sign of heart muscle strain.

## 2018-09-21 ENCOUNTER — Ambulatory Visit: Payer: 59 | Admitting: Cardiovascular Disease

## 2018-09-21 ENCOUNTER — Encounter: Payer: Self-pay | Admitting: Cardiovascular Disease

## 2018-09-21 VITALS — BP 116/76 | HR 86 | Ht 68.0 in | Wt 181.2 lb

## 2018-09-21 DIAGNOSIS — E785 Hyperlipidemia, unspecified: Secondary | ICD-10-CM

## 2018-09-21 DIAGNOSIS — R0600 Dyspnea, unspecified: Secondary | ICD-10-CM | POA: Diagnosis not present

## 2018-09-21 DIAGNOSIS — R079 Chest pain, unspecified: Secondary | ICD-10-CM | POA: Diagnosis not present

## 2018-09-21 DIAGNOSIS — I10 Essential (primary) hypertension: Secondary | ICD-10-CM | POA: Diagnosis not present

## 2018-09-21 NOTE — Patient Instructions (Signed)
Medication Instructions:   If you need a refill on your cardiac medications before your next appointment, please call your pharmacy.   Lab work:  If you have labs (blood work) drawn today and your tests are completely normal, you will receive your results only by: . MyChart Message (if you have MyChart) OR . A paper copy in the mail If you have any lab test that is abnormal or we need to change your treatment, we will call you to review the results.  Testing/Procedures: NONE ordered at this time.  Follow-Up: At [facility], you and your health needs are our priority.  As part of our continuing mission to provide you with exceptional heart care, we have created designated Provider Care Teams.  These Care Teams include your primary Cardiologist (physician) and Advanced Practice Providers (APPs -  Physician Assistants and Nurse Practitioners) who all work together to provide you with the care you need, when you need it. Your physician recommends that you schedule a follow-up appointment as needed with Dr. Nishan.     

## 2018-10-19 ENCOUNTER — Ambulatory Visit (INDEPENDENT_AMBULATORY_CARE_PROVIDER_SITE_OTHER): Payer: 59 | Admitting: Family Medicine

## 2018-10-19 ENCOUNTER — Ambulatory Visit (INDEPENDENT_AMBULATORY_CARE_PROVIDER_SITE_OTHER): Payer: Self-pay

## 2018-10-19 ENCOUNTER — Encounter (INDEPENDENT_AMBULATORY_CARE_PROVIDER_SITE_OTHER): Payer: Self-pay | Admitting: Family Medicine

## 2018-10-19 VITALS — BP 145/85 | HR 77 | Temp 99.0°F | Resp 24

## 2018-10-19 DIAGNOSIS — R0989 Other specified symptoms and signs involving the circulatory and respiratory systems: Secondary | ICD-10-CM | POA: Diagnosis not present

## 2018-10-19 DIAGNOSIS — M542 Cervicalgia: Secondary | ICD-10-CM

## 2018-10-19 MED ORDER — LEVOFLOXACIN 500 MG PO TABS: 500.0000 mg | ORAL_TABLET | Freq: Every day | ORAL | 1 refills | Status: DC

## 2018-10-19 MED ORDER — PREDNISONE 10 MG PO TABS: ORAL_TABLET | ORAL | 0 refills | Status: DC

## 2018-10-19 NOTE — Progress Notes (Signed)
   Office Visit Note   Patient: Daniel Hurley           Date of Birth: 1956/01/21           MRN: [id number] Visit Date: 10/19/2018 Requested by: Burton Apleyoberts, Ronald, MD [address] [place], [place] [address] PCP: Burton Apleyoberts, Ronald, MD  Subjective: Chief Complaint  Patient presents with  . Neck - Pain    Posterior neck pain since end of October 2019. NKI. H/o back surgery. Pain radiating into shoulder blades. Pain is worse with much sitting.  . chest congestion    HPI: He is here with neck pain as well as continued cough.  Neck started bothering him a couple weeks ago.  Long-standing intermittent problems with his neck, MRI scan in 2012 revealed multiple disc osteophyte complexes.  He has been dealing with pain since then.  Lately it is hurting into his left shoulder blade.  His cough has not gone away.  Cardiac work-up was negative.  Cough has become more productive.  He was exposed to his father last month who had pneumonia at the time.               ROS: Noncontributory  Objective: Vital Signs: BP (!) 145/85 (BP Location: Right Arm, Patient Position: Sitting, Cuff Size: Normal)   Pulse 77   Temp 99 F (37.2 C)   Resp (!) 24   Physical Exam:  Neck: Symmetric but decreased range of motion, negative Spurling's test.  He has some tenderness in bilateral paraspinous muscles.  Tender trigger point in the left rhomboid area seems to reproduce his current pain.  Upper extremity strength and reflexes normal. Lungs: Good air movement throughout, occasional expiratory crackle in the right lung base but no definite pneumonia.  No wheezing today.  Imaging: X-ray cervical spine: C6-7 moderate degenerative disc disease.  Diffuse facet DJD.  No sign of neoplasm.   Assessment & Plan: 1.  Chronic neck pain with acute flareup, myofascial left upper back pain as well. -Prednisone taper.  Deep tissue massage at home.  Physical therapy if symptoms persist.  2.  Persistent chest congestion  with cough, possibly related to COPD. -Prednisone, Levaquin.  If symptoms persist, consider pulmonary consult.   Follow-Up Instructions: No follow-ups on file.      Procedures: No procedures performed  No notes on file    PMFS History: Patient Active Problem List   Diagnosis Date Noted  . Acute respiratory failure with hypoxia (HCC) 01/20/2016  . Chest pain 01/19/2016  . COPD exacerbation (HCC) 01/19/2016  . Tobacco abuse 01/19/2016   Past Medical History:  Diagnosis Date  . COPD (chronic obstructive pulmonary disease) (HCC)     History reviewed. No pertinent family history.  Past Surgical History:  Procedure Laterality Date  . ANKLE FUSION  2006  . BACK SURGERY  2008   L5-5 fusion   . left wrist surgery     Social History   Occupational History  . Not on file  Tobacco Use  . Smoking status: Current Every Day Smoker    Packs/day: 1.50    Types: Cigarettes  . Smokeless tobacco: Never Used  Substance and Sexual Activity  . Alcohol use: No  . Drug use: No  . Sexual activity: Not on file

## 2018-11-22 ENCOUNTER — Encounter (INDEPENDENT_AMBULATORY_CARE_PROVIDER_SITE_OTHER): Payer: Self-pay | Admitting: Family Medicine

## 2018-11-23 ENCOUNTER — Other Ambulatory Visit (INDEPENDENT_AMBULATORY_CARE_PROVIDER_SITE_OTHER): Payer: Self-pay | Admitting: Family Medicine

## 2018-11-23 MED ORDER — PREDNISONE 10 MG PO TABS: ORAL_TABLET | ORAL | 0 refills | Status: DC

## 2018-11-23 MED ORDER — FLUTICASONE PROPIONATE HFA 110 MCG/ACT IN AERO
INHALATION_SPRAY | RESPIRATORY_TRACT | 12 refills | Status: DC
Start: 2018-11-23 — End: 2019-06-07

## 2019-06-07 ENCOUNTER — Ambulatory Visit (INDEPENDENT_AMBULATORY_CARE_PROVIDER_SITE_OTHER): Payer: 59 | Admitting: Family Medicine

## 2019-06-07 ENCOUNTER — Encounter: Payer: Self-pay | Admitting: Family Medicine

## 2019-06-07 VITALS — BP 146/95 | HR 72

## 2019-06-07 DIAGNOSIS — M79644 Pain in right finger(s): Secondary | ICD-10-CM

## 2019-06-07 DIAGNOSIS — M65311 Trigger thumb, right thumb: Secondary | ICD-10-CM

## 2019-06-07 MED ORDER — DICLOFENAC SODIUM 1 % TD GEL: 4.0000 g | Freq: Four times a day (QID) | TRANSDERMAL | 6 refills | Status: DC | PRN

## 2019-06-07 MED ORDER — FLOVENT HFA 110 MCG/ACT IN AERO: 1.0000 | INHALATION_SPRAY | Freq: Two times a day (BID) | RESPIRATORY_TRACT | 12 refills | Status: DC

## 2019-06-07 NOTE — Progress Notes (Signed)
   Office Visit Note   Patient: Daniel Hurley           Date of Birth: [birth year]           MRN: [id number] Visit Date: 06/07/2019 Requested by: Lorene Dy, [place], [place],  [place] [address] PCP: Lorene Dy, MD  Subjective: Chief Complaint  Patient presents with  . Right Thumb - Pain    Pain in thumb x 2 weeks. Gets stuck. NKI    HPI: He is here with right thumb pain.  Symptoms started 2 weeks ago, no injury.  He is right-hand dominant.  It gets stuck in flexion and hurts on the palmar aspect.  Denies numbness or tingling.              ROS: No fevers or chills.  All other systems were reviewed and are negative.  Objective: Vital Signs: There were no vitals taken for this visit.  Physical Exam:  General:  Alert and oriented, in no acute distress. Pulm:  Breathing unlabored. Psy:  Normal mood, congruent affect. Skin: No rash or bruising. Right thumb: Full range of motion but it triggers in flexion, tender nodule at the A1 pulley.  Imaging: None today.  Assessment & Plan: 1.  Right trigger thumb -Discussed treatment options, elected to splint in extension, use ice to the palm of the hand, Voltaren gel topically.  If symptoms persist could inject with cortisone or try occupational therapy.     Procedures: No procedures performed  No notes on file     PMFS History: Patient Active Problem List   Diagnosis Date Noted  . Acute respiratory failure with hypoxia ([place]) 01/20/2016  . Chest pain 01/19/2016  . COPD exacerbation ([place]) 01/19/2016  . Tobacco abuse 01/19/2016   Past Medical History:  Diagnosis Date  . COPD (chronic obstructive pulmonary disease) ([place])     History reviewed. No pertinent family history.  Past Surgical History:  Procedure Laterality Date  . ANKLE FUSION  2006  . BACK SURGERY  2008   L5-5 fusion   . left wrist surgery     Social History   Occupational History  . Not on file  Tobacco Use  . Smoking status:  Current Every Day Smoker    Packs/day: 1.50    Types: Cigarettes  . Smokeless tobacco: Never Used  Substance and Sexual Activity  . Alcohol use: No  . Drug use: No  . Sexual activity: Not on file

## 2019-10-27 ENCOUNTER — Other Ambulatory Visit: Payer: Self-pay | Admitting: Family Medicine

## 2019-10-27 MED ORDER — SILDENAFIL CITRATE 20 MG PO TABS: 20.0000 mg | ORAL_TABLET | Freq: Two times a day (BID) | ORAL | 11 refills | Status: DC

## 2019-12-15 ENCOUNTER — Other Ambulatory Visit: Payer: Self-pay

## 2019-12-15 ENCOUNTER — Encounter: Payer: Self-pay | Admitting: Family Medicine

## 2019-12-15 ENCOUNTER — Ambulatory Visit (INDEPENDENT_AMBULATORY_CARE_PROVIDER_SITE_OTHER): Payer: 59 | Admitting: Family Medicine

## 2019-12-15 VITALS — BP 133/88 | HR 76 | Resp 20

## 2019-12-15 DIAGNOSIS — R0600 Dyspnea, unspecified: Secondary | ICD-10-CM

## 2019-12-15 DIAGNOSIS — R0609 Other forms of dyspnea: Secondary | ICD-10-CM

## 2019-12-15 DIAGNOSIS — R05 Cough: Secondary | ICD-10-CM

## 2019-12-15 DIAGNOSIS — R0989 Other specified symptoms and signs involving the circulatory and respiratory systems: Secondary | ICD-10-CM | POA: Diagnosis not present

## 2019-12-15 DIAGNOSIS — R059 Cough, unspecified: Secondary | ICD-10-CM

## 2019-12-15 MED ORDER — PREDNISONE 10 MG PO TABS: ORAL_TABLET | ORAL | 0 refills | Status: DC

## 2019-12-15 MED ORDER — FLOVENT HFA 110 MCG/ACT IN AERO: 1.0000 | INHALATION_SPRAY | Freq: Two times a day (BID) | RESPIRATORY_TRACT | 12 refills | Status: DC

## 2019-12-15 MED ORDER — QVAR REDIHALER 80 MCG/ACT IN AERB: 2.0000 | INHALATION_SPRAY | Freq: Two times a day (BID) | RESPIRATORY_TRACT | 11 refills | Status: DC

## 2019-12-15 MED ORDER — LEVOFLOXACIN 500 MG PO TABS: 500.0000 mg | ORAL_TABLET | Freq: Every day | ORAL | 1 refills | Status: DC

## 2019-12-15 NOTE — Progress Notes (Signed)
   Office Visit Note   Patient: Daniel Hurley           Date of Birth: [birth year]           MRN: [id number] Visit Date: 12/15/2019 Requested by: Burton Apley, MD [address] [place],  [place] [address] PCP: Lavada Mesi, MD  Subjective: Chief Complaint  Patient presents with  . chest/nasal congestion x 3-4 months    HPI: He is here with chest congestion and cough.  Symptoms started 3 or 4 months ago.  Around this time last year he had a similar problem which eventually improved with prednisone and Levaquin.  He has not had fever, his cough is occasionally productive especially in the morning.  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  He had a stress echocardiogram in November 2019 which was normal.  CT scan of the chest in 2019 showed mild emphysema.               ROS:   All other systems were reviewed and are negative.  Objective: Vital Signs: BP 133/88   Pulse 76   Resp 20   SpO2 98%   Physical Exam:  General:  Alert and oriented, in no acute distress. Pulm:  Breathing unlabored. Psy:  Normal mood, congruent affect.  CV: Regular rate and rhythm without murmurs, rubs, or gallops.  No peripheral edema.  2+ radial and posterior tibial pulses. Lungs: Clear to auscultation throughout with no wheezing or areas of consolidation.    Imaging: None today  Assessment & Plan: 1.  Chest congestion with cough, possible COPD exacerbation related to dust exposure -We will treat with prednisone and Levaquin.  We will start Qvar inhaler.     Procedures: No procedures performed  No notes on file     PMFS History: Patient Active Problem List   Diagnosis Date Noted  . Acute respiratory failure with hypoxia (HCC) 01/20/2016  . Chest pain 01/19/2016  . COPD exacerbation (HCC) 01/19/2016  . Tobacco abuse 01/19/2016   Past Medical History:  Diagnosis Date  . COPD (chronic obstructive pulmonary disease) (HCC)     History reviewed.  No pertinent family history.  Past Surgical History:  Procedure Laterality Date  . ANKLE FUSION  2006  . BACK SURGERY  2008   L5-5 fusion   . left wrist surgery     Social History   Occupational History  . Not on file  Tobacco Use  . Smoking status: Current Every Day Smoker    Packs/day: 1.50    Types: Cigarettes  . Smokeless tobacco: Never Used  Substance and Sexual Activity  . Alcohol use: No  . Drug use: No  . Sexual activity: Not on file

## 2020-01-09 ENCOUNTER — Telehealth: Payer: Self-pay | Admitting: Family Medicine

## 2020-01-09 NOTE — Telephone Encounter (Signed)
Pt called in requesting to speak to terri, pt didn't want to discuss anything with me and wanted a call back from terri.   [phone]

## 2020-01-09 NOTE — Telephone Encounter (Signed)
IC s/w Mr Mchargue. He was actually calling regarding his mom. I have made appropriate documentation elsewhere.

## 2020-02-01 IMAGING — CT CT CHEST W/O CM
1 series · 14 of 34 positions shown, 18 images · non-contrast
Comparison: Chest x-ray of 07/05/2018

CLINICAL DATA: Cough and short of breath for several months

EXAM:
CT CHEST WITHOUT CONTRAST
TECHNIQUE: Multidetector CT imaging of the chest was performed following the
standard protocol without IV contrast.

[Series 2: chest w/(date) · axial · 0.76mm/px · z∈[-341,-73]mm · 14 of 158 slices shown, 18 images]
[im 12/158  mediastinal]
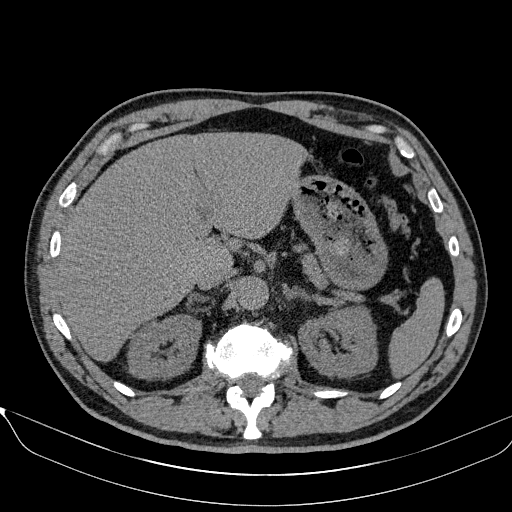
[im 12/158  lung]
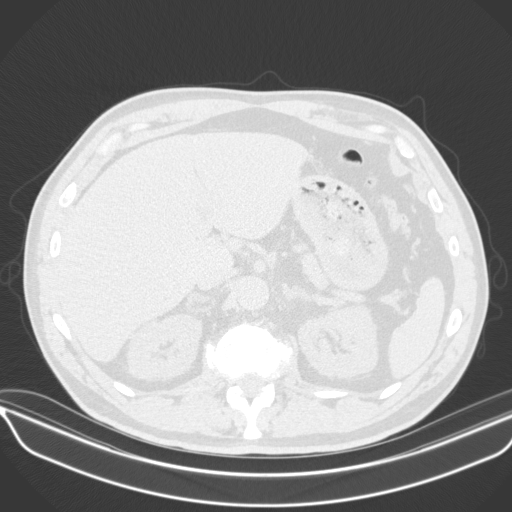
[im 24/158  lung]
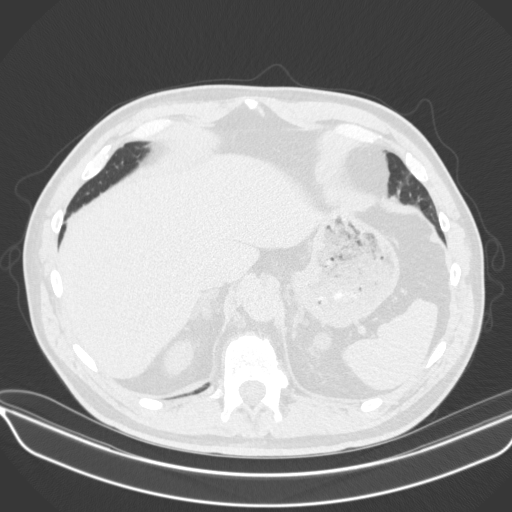
[im 32/158  lung]
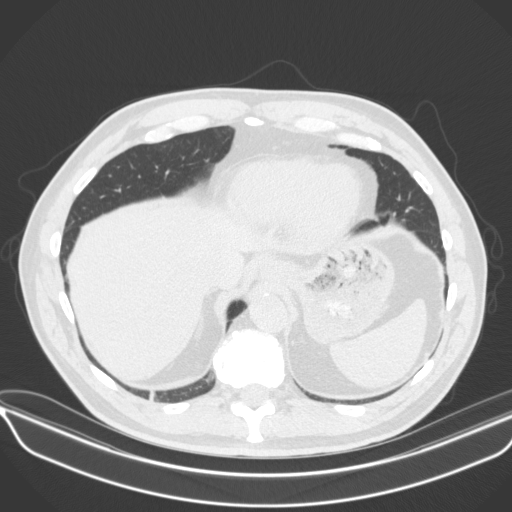
[im 47/158  lung]
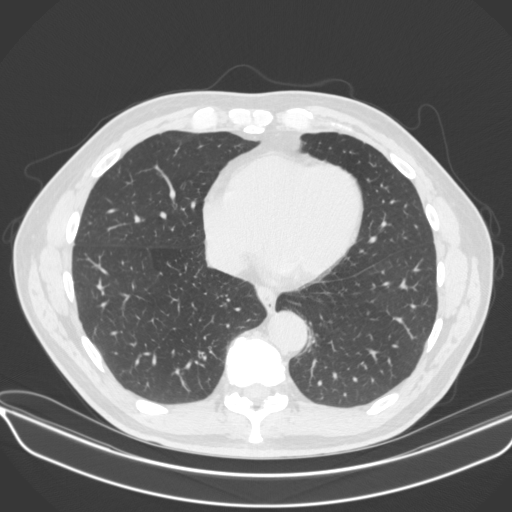
[im 59/158  mediastinal]
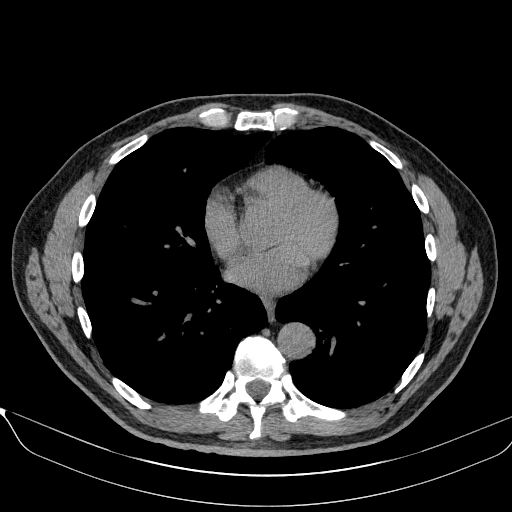
[im 59/158  lung]
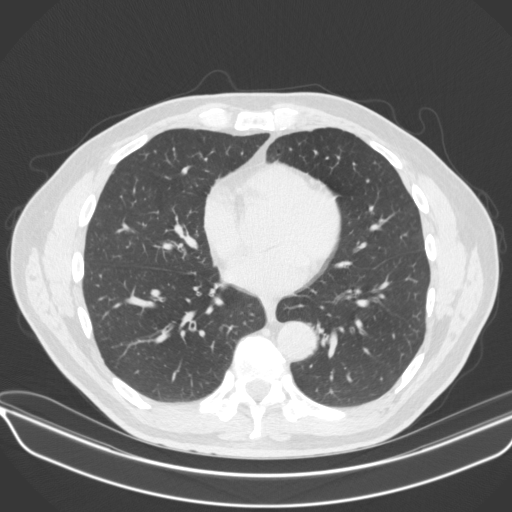
[im 64/158  lung]
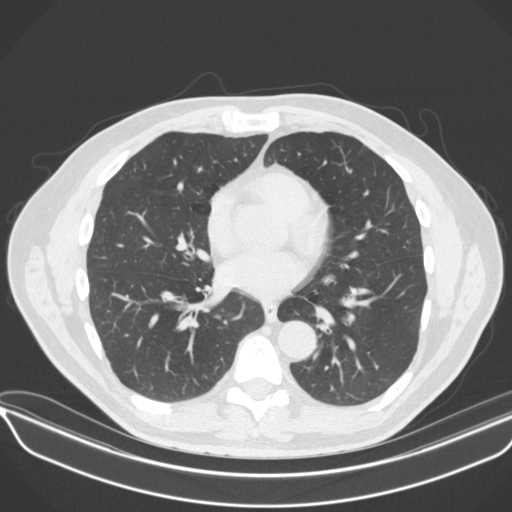
[im 75/158  lung]
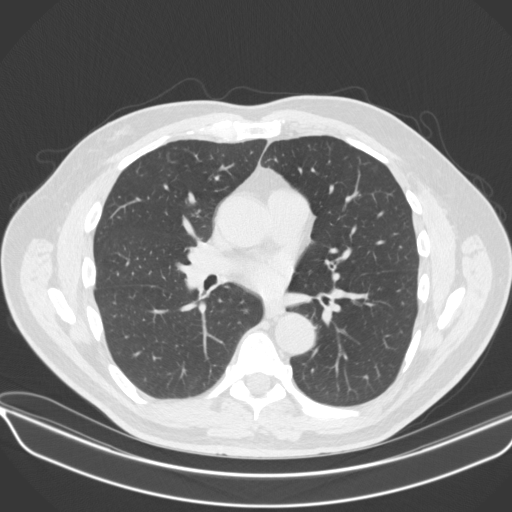
[im 84/158  lung]
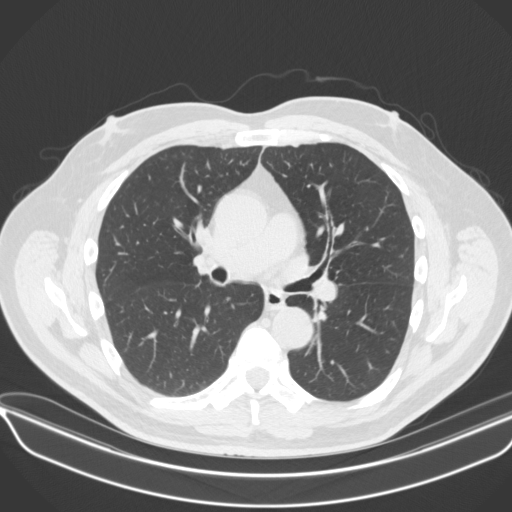
[im 94/158  mediastinal]
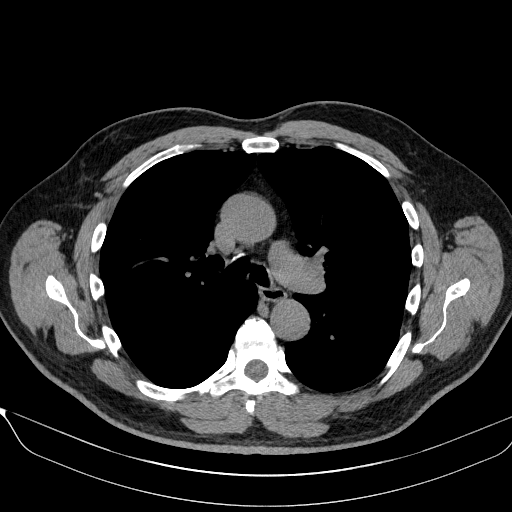
[im 94/158  lung]
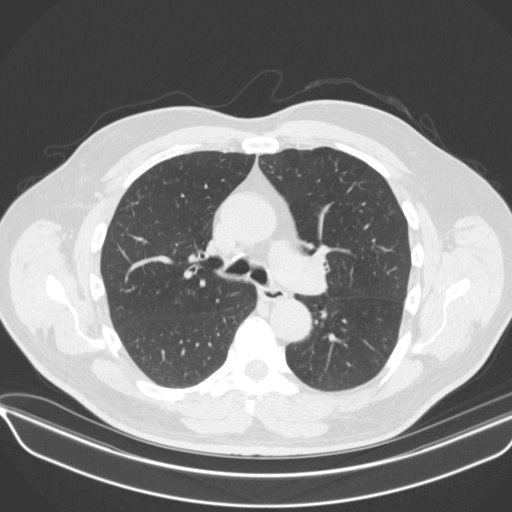
[im 99/158  lung]
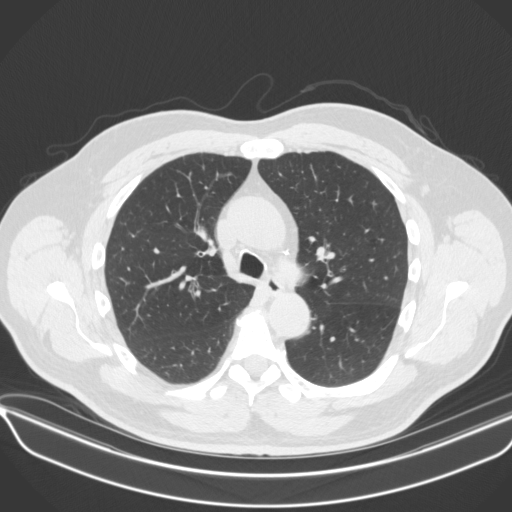
[im 117/158  lung]
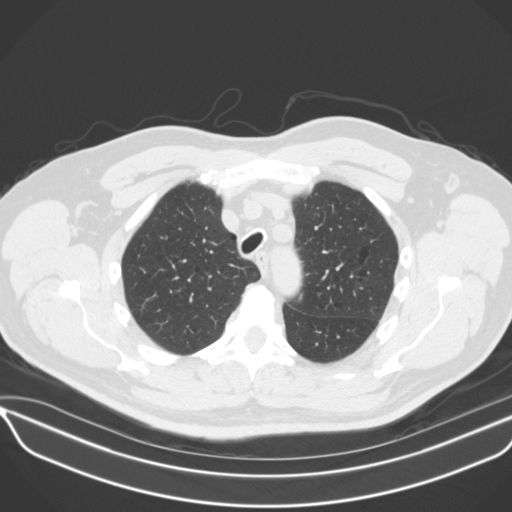
[im 126/158  lung]
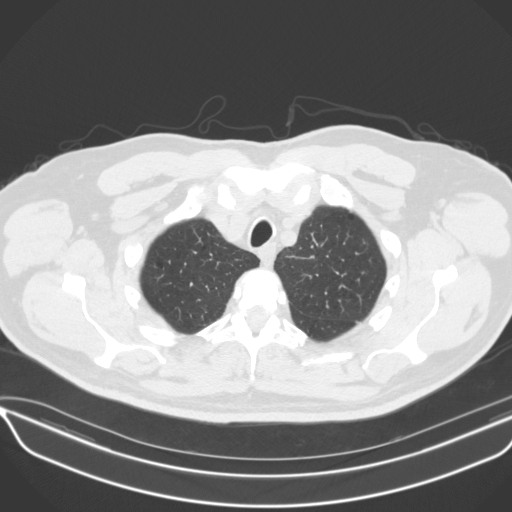
[im 134/158  mediastinal]
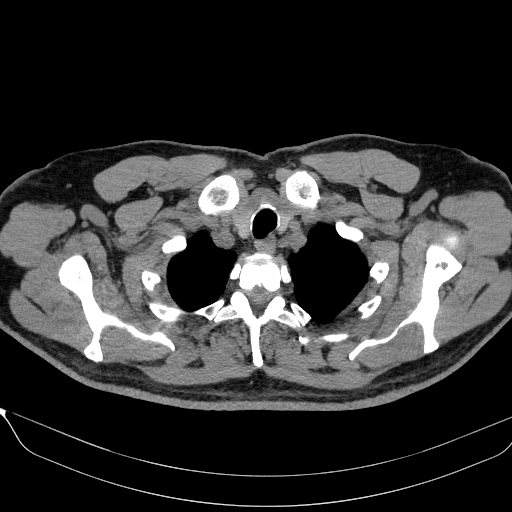
[im 134/158  lung]
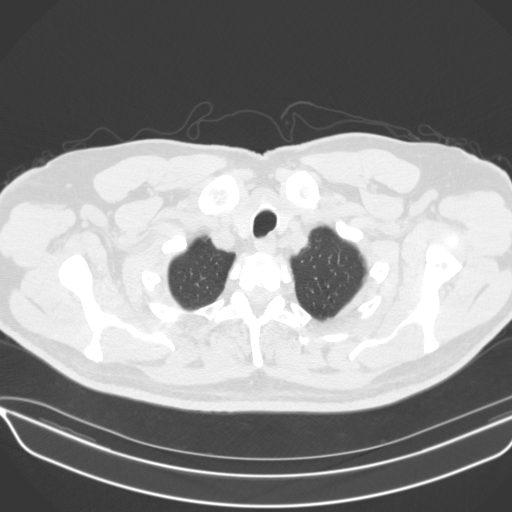
[im 146/158  lung]
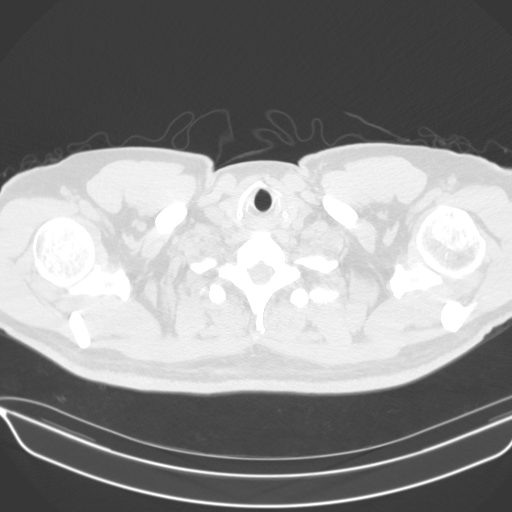

[14 of 34 positions shown; findings below may reference images not displayed]

FINDINGS: Cardiovascular: Mild thoracic aortic atherosclerosis is present.
Coronary artery calcifications are noted primarily in the
distribution of the left anterior descending coronary artery. The
heart is within normal limits in size. No pericardial effusion is
seen. The mid ascending thoracic aorta measures 39 mm in diameter.
This suggest mild fusiform dilatation.

Mediastinum/Nodes: No mediastinal or hilar adenopathy is seen. The
thyroid gland is unremarkable. However, on the initial images
including the lower neck, there is some asymmetry of the soft
tissues with soft tissue fullness in the region of the right
piriform sinus. ENT assessment may be warranted. Or alternatively,
MRI of the neck could be performed to evaluate soft tissues in that
region. There may be a small hiatal hernia present versus mild
thickening of the distal esophageal mucosa probably due to reflux
esophagitis.

Lungs/Pleura: On lung window images there may be very mild
centrilobular emphysema present. No pneumonia or pleural effusion is
seen. No suspicious lung nodule is noted. There appears to be a
linear atelectasis or scarring in the posteromedial right lower
lobe. The central airway is patent.

Upper Abdomen: On images through the upper abdomen. There is a left
renal calculus present which is nonobstructing of approximately 7 mm
in diameter. The portion of the pancreas that is visualized is
unremarkable.

Musculoskeletal: The thoracic vertebrae are in normal alignment with
degenerative changes in the mid and lower thoracic spine. The
sternum appears intact. No rib abnormality is seen.
IMPRESSION: 1. There is soft tissue asymmetry in the low neck in the region the
right piriform sinus of uncertain significance. Either MRI of the
neck or ENT assessment may be warranted. Correlate clinically.
2. No pneumonia or pleural effusion.
3. Mild centrilobular emphysema.
4. Mild thoracic aortic atherosclerosis with coronary artery
calcifications present as noted above.
5. Single nonobstructing 7 mm left renal calculus.

## 2020-02-07 ENCOUNTER — Other Ambulatory Visit: Payer: Self-pay | Admitting: Family Medicine

## 2020-02-07 MED ORDER — PREDNISONE 10 MG PO TABS: ORAL_TABLET | ORAL | 0 refills | Status: DC

## 2020-02-12 ENCOUNTER — Other Ambulatory Visit: Payer: Self-pay

## 2020-02-12 ENCOUNTER — Encounter: Payer: Self-pay | Admitting: Family Medicine

## 2020-02-12 ENCOUNTER — Ambulatory Visit (INDEPENDENT_AMBULATORY_CARE_PROVIDER_SITE_OTHER): Payer: Medicare Other | Admitting: Family Medicine

## 2020-02-12 VITALS — BP 145/83 | HR 100

## 2020-02-12 DIAGNOSIS — R42 Dizziness and giddiness: Secondary | ICD-10-CM | POA: Diagnosis not present

## 2020-02-12 DIAGNOSIS — I10 Essential (primary) hypertension: Secondary | ICD-10-CM | POA: Diagnosis not present

## 2020-02-12 HISTORY — DX: Essential (primary) hypertension: I10

## 2020-02-12 MED ORDER — AMLODIPINE BESYLATE 5 MG PO TABS: 5.0000 mg | ORAL_TABLET | Freq: Every day | ORAL | 1 refills | Status: DC

## 2020-02-12 NOTE — Progress Notes (Signed)
Office Visit Note   Patient: Daniel Hurley           Date of Birth: 1956-04-12           MRN: [id number] Visit Date: 02/12/2020 Requested by: Lavada Mesi, MD [address] [place],  [place] [address] PCP: Lavada Mesi, MD  Subjective: Chief Complaint  Patient presents with  . Blood Pressure Check  . Dizziness    started last Wednesday    HPI: He is here with dizziness and concerns about his blood pressure.  Dizziness started about a week ago.  He woke up 1 day starting to feel dizzy when standing.  Dizziness occurs intermittently and randomly but seems to get better after he eats.  No change in his medications.  His blood pressure readings at home are also very sporadic and frequently elevated but blood pressure readings do not always correlate with his dizziness.  He is currently on lisinopril for his blood pressure.  He was on amlodipine in the past but was taken off it a couple years ago.  Denies chest pain today but he has occasional episodes of chest pain that do not seem to be related to his dizziness, the pain goes away after about an hour without taking anything for it.  Denies any palpitations.  Denies any shortness of breath.  He recently started taking prednisone for his chronic chest and head congestion and seems to be feeling better from that standpoint.              ROS: No fevers or chills.  All other systems were reviewed and are negative.  Objective: Vital Signs: BP (!) 145/83   Pulse 100   Physical Exam:  General:  Alert and oriented, in no acute distress. Pulm:  Breathing unlabored. Psy:  Normal mood, congruent affect.  HEENT:  [place]/AT, PERRLA, EOM Full, no nystagmus.  Funduscopic examination within normal limits.  No conjunctival erythema.  Tympanic membranes are pearly gray with normal landmarks.  External ear canals are normal.  Nasal passages are clear.  Oropharynx is clear.  No significant lymphadenopathy.  No thyromegaly or nodules.  2+ carotid pulses  without bruits. CV: Regular rate and rhythm without murmurs, rubs, or gallops.  No peripheral edema.  2+ radial and posterior tibial pulses. Lungs: Clear to auscultation throughout with no wheezing or areas of consolidation.    Imaging: None today  Assessment & Plan: 1.  Dizziness, etiology uncertain.  Could be related to blood pressure, cannot rule out blood sugar regulation problems, thyroid dysfunction, etc. -Labs to evaluate.  We will add Norvasc 5 mg daily to his blood pressure regimen.  Return in about a month, sooner for any problems.  2.  Hypertension, suboptimally controlled -Continue with lisinopril 10 mg daily and we will add amlodipine 5 mg daily.     Procedures: No procedures performed  No notes on file     PMFS History: Patient Active Problem List   Diagnosis Date Noted  . Hypertension 02/12/2020  . Acute respiratory failure with hypoxia (HCC) 01/20/2016  . Chest pain 01/19/2016  . COPD exacerbation (HCC) 01/19/2016  . Tobacco abuse 01/19/2016   Past Medical History:  Diagnosis Date  . COPD (chronic obstructive pulmonary disease) (HCC)   . Hypertension 02/12/2020    History reviewed. No pertinent family history.  Past Surgical History:  Procedure Laterality Date  . ANKLE FUSION  2006  . BACK SURGERY  2008   L5-5 fusion   . left wrist surgery  Social History   Occupational History  . Not on file  Tobacco Use  . Smoking status: Current Every Day Smoker    Packs/day: 1.50    Types: Cigarettes  . Smokeless tobacco: Never Used  Substance and Sexual Activity  . Alcohol use: No  . Drug use: No  . Sexual activity: Not on file

## 2020-02-13 ENCOUNTER — Telehealth: Payer: Self-pay | Admitting: Family Medicine

## 2020-02-13 DIAGNOSIS — R7989 Other specified abnormal findings of blood chemistry: Secondary | ICD-10-CM

## 2020-02-13 DIAGNOSIS — R739 Hyperglycemia, unspecified: Secondary | ICD-10-CM

## 2020-02-13 LAB — COMPREHENSIVE METABOLIC PANEL
AG Ratio: 1.8 (calc) (ref 1.0–2.5)
ALT: 9 U/L (ref 9–46)
AST: 9 U/L — ABNORMAL LOW (ref 10–35)
Albumin: 4.2 g/dL (ref 3.6–5.1)
Alkaline phosphatase (APISO): 66 U/L (ref 35–144)
BUN: 15 mg/dL (ref 7–25)
CO2: 29 mmol/L (ref 20–32)
Calcium: 9.6 mg/dL (ref 8.6–10.3)
Chloride: 102 mmol/L (ref 98–110)
Creat: 0.96 mg/dL (ref 0.70–1.25)
Globulin: 2.4 g/dL (calc) (ref 1.9–3.7)
Glucose, Bld: 200 mg/dL — ABNORMAL HIGH (ref 65–99)
Potassium: 4.2 mmol/L (ref 3.5–5.3)
Sodium: 140 mmol/L (ref 135–146)
Total Bilirubin: 0.7 mg/dL (ref 0.2–1.2)
Total Protein: 6.6 g/dL (ref 6.1–8.1)

## 2020-02-13 LAB — LIPID PANEL
Cholesterol: 170 mg/dL (ref <200)
HDL: 48 mg/dL (ref >=40)
LDL Cholesterol (Calc): 102 mg/dL (calc) — ABNORMAL HIGH
Non-HDL Cholesterol (Calc): 122 mg/dL (calc) (ref <130)
Total CHOL/HDL Ratio: 3.5 (calc) (ref <5.0)
Triglycerides: 102 mg/dL (ref <150)

## 2020-02-13 LAB — CBC WITH DIFFERENTIAL/PLATELET
Absolute Monocytes: 920 cells/uL (ref 200–950)
Basophils Absolute: 35 cells/uL (ref 0–200)
Basophils Relative: 0.3 %
Eosinophils Absolute: 0 cells/uL — ABNORMAL LOW (ref 15–500)
Eosinophils Relative: 0 %
HCT: 47.1 % (ref 38.5–50.0)
Hemoglobin: 15.7 g/dL (ref 13.2–17.1)
Lymphs Abs: 1334 cells/uL (ref 850–3900)
MCH: 30.6 pg (ref 27.0–33.0)
MCHC: 33.3 g/dL (ref 32.0–36.0)
MCV: 91.8 fL (ref 80.0–100.0)
MPV: 12.4 fL (ref 7.5–12.5)
Monocytes Relative: 8 %
Neutro Abs: 9212 cells/uL — ABNORMAL HIGH (ref 1500–7800)
Neutrophils Relative %: 80.1 %
Platelets: 275 10*3/uL (ref 140–400)
RBC: 5.13 10*6/uL (ref 4.20–5.80)
RDW: 12.9 % (ref 11.0–15.0)
Total Lymphocyte: 11.6 %
WBC: 11.5 10*3/uL — ABNORMAL HIGH (ref 3.8–10.8)

## 2020-02-13 LAB — THYROID PANEL WITH TSH
Free Thyroxine Index: 2.7 (ref 1.4–3.8)
T3 Uptake: 31 % (ref 22–35)
T4, Total: 8.6 ug/dL (ref 4.9–10.5)
TSH: 0.32 mIU/L — ABNORMAL LOW (ref 0.40–4.50)

## 2020-02-13 LAB — HEMOGLOBIN A1C
Hgb A1c MFr Bld: 5.9 % of total Hgb — ABNORMAL HIGH (ref <5.7)
Mean Plasma Glucose: 123 (calc)
eAG (mmol/L): 6.8 (calc)

## 2020-02-13 NOTE — Telephone Encounter (Signed)
Labs show:  WBC slightly elevated due to prednisone.  Glucose elevated also due to prednisone, but A1C (65-month average of glucose) is in pre-diabetes range.  Very important to keep active and limit intake of breads; pastas; cereals; sugars/sweets.  Recheck in 3-4 months.  Thyroid TSH is low but other numbers look ok.  Will recheck in 3 months.  All else looks ok.

## 2020-02-26 ENCOUNTER — Ambulatory Visit (INDEPENDENT_AMBULATORY_CARE_PROVIDER_SITE_OTHER): Payer: Medicare Other | Admitting: Family Medicine

## 2020-02-26 ENCOUNTER — Other Ambulatory Visit: Payer: Self-pay

## 2020-02-26 ENCOUNTER — Ambulatory Visit: Payer: Self-pay

## 2020-02-26 ENCOUNTER — Encounter: Payer: Self-pay | Admitting: Family Medicine

## 2020-02-26 DIAGNOSIS — M25562 Pain in left knee: Secondary | ICD-10-CM

## 2020-02-26 NOTE — Progress Notes (Signed)
   Office Visit Note   Patient: Daniel Hurley           Date of Birth: September 14, 1956           MRN: [id number] Visit Date: 02/26/2020 Requested by: Lavada Mesi, MD [address] [place],  [place] [address] PCP: Lavada Mesi, MD  Subjective: Chief Complaint  Patient presents with  . Left Knee - Pain    HPI: He is here with left knee pain.  Last week she was carrying a 40 pound bucket up some stairs and felt something pop in his knee.  Immediate pain on the anterior aspect, hurts to go up and down steps now.  Minimal pain when walking on flat surface.              ROS:   All other systems were reviewed and are negative.  Objective: Vital Signs: There were no vitals taken for this visit.  Physical Exam:  General:  Alert and oriented, in no acute distress. Pulm:  Breathing unlabored. Psy:  Normal mood, congruent affect. Skin: No bruising or erythema Left knee: No effusion, trace patellofemoral crepitus.  Mild pain with patellar compression, no tenderness over the quadriceps or patellar tendons.  He has tenderness over the lateral joint line but no palpable click with McMurray's and no pain with McMurray's.  Ligaments feel stable.  Imaging: X-rays left knee: He has early patellofemoral spurring and very mild medial compartment narrowing, no other acute abnormality seen.  Assessment & Plan: 1.  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  If symptoms persist, consider a one-time cortisone injection.  2.  Possible sebaceous cyst left lower eyelid -After sterile prep with alcohol, I gently punctured it with a 25-gauge needle.  No significant sebaceous material was expressed.  He will use warm compresses on it and if it persists, I will refer him to Dr. Emily Filbert.     Procedures: No procedures performed  No notes on file     PMFS History: Patient Active Problem List   Diagnosis Date Noted  . Hypertension 02/12/2020    . Acute respiratory failure with hypoxia (HCC) 01/20/2016  . Chest pain 01/19/2016  . COPD exacerbation (HCC) 01/19/2016  . Tobacco abuse 01/19/2016   Past Medical History:  Diagnosis Date  . COPD (chronic obstructive pulmonary disease) (HCC)   . Hypertension 02/12/2020    History reviewed. No pertinent family history.  Past Surgical History:  Procedure Laterality Date  . ANKLE FUSION  2006  . BACK SURGERY  2008   L5-5 fusion   . left wrist surgery     Social History   Occupational History  . Not on file  Tobacco Use  . Smoking status: Current Every Day Smoker    Packs/day: 1.50    Types: Cigarettes  . Smokeless tobacco: Never Used  Substance and Sexual Activity  . Alcohol use: No  . Drug use: No  . Sexual activity: Not on file

## 2020-02-26 NOTE — Progress Notes (Signed)
New Injury-Carried 40lb bucket up steps and heard a pop- This was last wednesday

## 2020-03-07 ENCOUNTER — Encounter: Payer: Self-pay | Admitting: Family Medicine

## 2020-03-08 MED ORDER — ALBUTEROL SULFATE HFA 108 (90 BASE) MCG/ACT IN AERS: 2.0000 | INHALATION_SPRAY | Freq: Four times a day (QID) | RESPIRATORY_TRACT | 11 refills | Status: DC | PRN

## 2020-04-01 ENCOUNTER — Other Ambulatory Visit: Payer: Self-pay | Admitting: Family Medicine

## 2020-04-01 MED ORDER — HYDROCODONE-ACETAMINOPHEN 7.5-325 MG PO TABS: 1.0000 | ORAL_TABLET | Freq: Four times a day (QID) | ORAL | 0 refills | Status: DC | PRN

## 2020-04-17 ENCOUNTER — Telehealth: Payer: Self-pay | Admitting: Family Medicine

## 2020-04-17 ENCOUNTER — Encounter: Payer: Self-pay | Admitting: Family Medicine

## 2020-04-17 ENCOUNTER — Ambulatory Visit: Payer: Medicare Other | Admitting: Family Medicine

## 2020-04-17 ENCOUNTER — Other Ambulatory Visit: Payer: Self-pay

## 2020-04-17 VITALS — HR 82

## 2020-04-17 DIAGNOSIS — R0600 Dyspnea, unspecified: Secondary | ICD-10-CM

## 2020-04-17 DIAGNOSIS — R0609 Other forms of dyspnea: Secondary | ICD-10-CM

## 2020-04-17 MED ORDER — HYDROCODONE-ACETAMINOPHEN 7.5-325 MG PO TABS: 1.0000 | ORAL_TABLET | Freq: Four times a day (QID) | ORAL | 0 refills | Status: DC | PRN

## 2020-04-17 MED ORDER — PREDNISONE 10 MG PO TABS: ORAL_TABLET | ORAL | 2 refills | Status: DC

## 2020-04-17 NOTE — Progress Notes (Signed)
   Office Visit Note   Patient: Daniel Hurley           Date of Birth: 1956-08-11           MRN: [id number] Visit Date: 04/17/2020 Requested by: Lavada Mesi, MD [address] [place],  [place] [address] PCP: Lavada Mesi, MD  Subjective: Chief Complaint  Patient presents with  . Shortness of Breath    getting worse over 1.5 weeks, trouble with walking from house to car now.     HPI: He is here with worsening shortness of breath.  He has chronic troubles, but in the past couple weeks he has taken a turn for the worse.  He has been congested in the head every day, clear drainage.  No fevers or chills.  No chest pain.  He has been wheezing quite a bit.  He is using his inhaler twice daily.               ROS:   All other systems were reviewed and are negative.  Objective: Vital Signs: Pulse 82   SpO2 94%   Physical Exam:  General:  Alert and oriented, in no acute distress. Pulm:  Breathing unlabored. Psy:  Normal mood, congruent affect  Lungs: He has good air movement throughout but he has expiratory wheezing diffusely, no areas of consolidation. Heart: Regular rate and rhythm without murmurs, rubs, or gallops.  Imaging: No results found.  Assessment & Plan: 1.  Probable COPD exacerbation -He did well with oral prednisone last time so we will repeat that today.  Continue with inhalers. -If he develops symptoms of infection we will treat with antibiotics but I do not think that is indicated right now. -At some point I think he would benefit from allergy testing because these exacerbations seem to be related.     Procedures: No procedures performed  No notes on file     PMFS History: Patient Active Problem List   Diagnosis Date Noted  . Hypertension 02/12/2020  . Acute respiratory failure with hypoxia (HCC) 01/20/2016  . Chest pain 01/19/2016  . COPD exacerbation (HCC) 01/19/2016  . Tobacco abuse 01/19/2016   Past Medical History:  Diagnosis Date  . COPD  (chronic obstructive pulmonary disease) (HCC)   . Hypertension 02/12/2020    History reviewed. No pertinent family history.  Past Surgical History:  Procedure Laterality Date  . ANKLE FUSION  2006  . BACK SURGERY  2008   L5-5 fusion   . left wrist surgery     Social History   Occupational History  . Not on file  Tobacco Use  . Smoking status: Current Every Day Smoker    Packs/day: 1.50    Types: Cigarettes  . Smokeless tobacco: Never Used  Substance and Sexual Activity  . Alcohol use: No  . Drug use: No  . Sexual activity: Not on file

## 2020-04-17 NOTE — Telephone Encounter (Signed)
Pharmacy called.   They need a call back to establish the patient's diagnosis  Call back: [phone]

## 2020-04-17 NOTE — Telephone Encounter (Signed)
Called and advised.

## 2020-05-15 ENCOUNTER — Encounter: Payer: Self-pay | Admitting: Family Medicine

## 2020-05-15 MED ORDER — HYDROCODONE-ACETAMINOPHEN 7.5-325 MG PO TABS: 1.0000 | ORAL_TABLET | Freq: Four times a day (QID) | ORAL | 0 refills | Status: DC | PRN

## 2020-06-18 ENCOUNTER — Encounter: Payer: Self-pay | Admitting: Family Medicine

## 2020-06-19 ENCOUNTER — Other Ambulatory Visit: Payer: Self-pay | Admitting: Family Medicine

## 2020-06-19 MED ORDER — HYDROCODONE-ACETAMINOPHEN 7.5-325 MG PO TABS: 1.0000 | ORAL_TABLET | Freq: Four times a day (QID) | ORAL | 0 refills | Status: DC | PRN

## 2020-07-04 ENCOUNTER — Telehealth: Payer: Self-pay | Admitting: Family Medicine

## 2020-07-04 NOTE — Telephone Encounter (Signed)
Patient's wife Rosey Bath called asked if patient can be worked in Dr. Prince Rome schedule tomorrow. Patient is having problems with his lungs and want to have an x-ray done. Rosey Bath said also has trigger finger in both of his thumbs. The number to contact Rosey Bath is [phone]

## 2020-07-04 NOTE — Telephone Encounter (Signed)
Spoke with wife - patient will come in tomorrow at 2:20 to see Dr. Prince Rome.

## 2020-07-05 ENCOUNTER — Encounter: Payer: Self-pay | Admitting: Family Medicine

## 2020-07-05 ENCOUNTER — Ambulatory Visit: Payer: Medicare Other | Admitting: Family Medicine

## 2020-07-05 ENCOUNTER — Ambulatory Visit: Payer: Self-pay

## 2020-07-05 ENCOUNTER — Other Ambulatory Visit: Payer: Self-pay

## 2020-07-05 VITALS — BP 109/63 | HR 78 | Temp 98.8°F

## 2020-07-05 DIAGNOSIS — R0989 Other specified symptoms and signs involving the circulatory and respiratory systems: Secondary | ICD-10-CM | POA: Diagnosis not present

## 2020-07-05 DIAGNOSIS — R059 Cough, unspecified: Secondary | ICD-10-CM

## 2020-07-05 DIAGNOSIS — R0600 Dyspnea, unspecified: Secondary | ICD-10-CM

## 2020-07-05 DIAGNOSIS — R05 Cough: Secondary | ICD-10-CM

## 2020-07-05 DIAGNOSIS — M542 Cervicalgia: Secondary | ICD-10-CM | POA: Diagnosis not present

## 2020-07-05 DIAGNOSIS — M65342 Trigger finger, left ring finger: Secondary | ICD-10-CM

## 2020-07-05 DIAGNOSIS — R0609 Other forms of dyspnea: Secondary | ICD-10-CM

## 2020-07-05 MED ORDER — CHLORZOXAZONE 500 MG PO TABS: 500.0000 mg | ORAL_TABLET | Freq: Four times a day (QID) | ORAL | 3 refills | Status: DC | PRN

## 2020-07-05 MED ORDER — FLUTICASONE-SALMETEROL 500-50 MCG/DOSE IN AEPB
1.0000 | INHALATION_SPRAY | Freq: Two times a day (BID) | RESPIRATORY_TRACT | 6 refills | Status: DC
Start: 2020-07-05 — End: 2021-07-16

## 2020-07-05 NOTE — Progress Notes (Signed)
   Office Visit Note   Patient: Daniel Hurley           Date of Birth: [birth year]           MRN: [id number] Visit Date: 07/05/2020 Requested by: Lavada Mesi, MD [address] [place],  [place] [address] PCP: Lavada Mesi, MD  Subjective: Chief Complaint  Patient presents with  . SOB  . Cough    productive in the mornings  . triggers fingers, multiple    HPI: He is here with multiple concerns.  Main concern is progressively worsening shortness of breath.  Prednisone helped temporarily, but he has been wheezing a lot lately.  He is using his Flovent inhaler as well.  He has been sleeping on the couch due to his breathing, he woke up with neck pain this morning.  Mostly left-sided pain.  He is having trigger fingers in both hands intermittently.  Not always the same finger.               ROS:   All other systems were reviewed and are negative.  Objective: Vital Signs: BP 109/63   Pulse 78   Temp 98.8 F (37.1 C)   SpO2 94%   Physical Exam:  General:  Alert and oriented, in no acute distress. Pulm:  Breathing unlabored. Psy:  Normal mood, congruent affect.  Neck: Stiffness with rotation.  Tender in the left cervical paraspinous muscles. Lungs: Breath sounds are distant throughout.  He has expiratory wheezing bilaterally.  No areas of consolidation. Fingers: No active triggering today.   Imaging: XR Chest 2 View  Result Date: 07/05/2020 X-rays reveal hyperinflation consistent with COPD, no definite pneumonia or nodules.   Assessment & Plan: 1.  COPD with exacerbation -We will treat with Advair.  Pulmonary consult if symptoms worsen. -Emphasized the importance of smoking cessation.  2.  Neck pain -Parafon forte as needed.  3.  Trigger fingers -Not bothersome enough yet for injection.     Procedures: No procedures performed  No notes on file     PMFS History: Patient Active Problem List   Diagnosis Date Noted  . Hypertension 02/12/2020  .  Acute respiratory failure with hypoxia (HCC) 01/20/2016  . Chest pain 01/19/2016  . COPD exacerbation (HCC) 01/19/2016  . Tobacco abuse 01/19/2016   Past Medical History:  Diagnosis Date  . COPD (chronic obstructive pulmonary disease) (HCC)   . Hypertension 02/12/2020    History reviewed. No pertinent family history.  Past Surgical History:  Procedure Laterality Date  . ANKLE FUSION  2006  . BACK SURGERY  2008   L5-5 fusion   . left wrist surgery     Social History   Occupational History  . Not on file  Tobacco Use  . Smoking status: Current Every Day Smoker    Packs/day: 1.50    Types: Cigarettes  . Smokeless tobacco: Never Used  Substance and Sexual Activity  . Alcohol use: No  . Drug use: No  . Sexual activity: Not on file

## 2020-07-08 ENCOUNTER — Encounter: Payer: Self-pay | Admitting: Family Medicine

## 2020-07-08 DIAGNOSIS — M542 Cervicalgia: Secondary | ICD-10-CM

## 2020-07-08 MED ORDER — HYDROCODONE-ACETAMINOPHEN 7.5-325 MG PO TABS: 1.0000 | ORAL_TABLET | Freq: Four times a day (QID) | ORAL | 0 refills | Status: DC | PRN

## 2020-07-08 NOTE — Addendum Note (Signed)
Addended by: Lillia Carmel on: 07/08/2020 09:16 AM   Modules accepted: Orders

## 2020-07-29 ENCOUNTER — Encounter: Payer: Self-pay | Admitting: Family Medicine

## 2020-07-30 MED ORDER — LISINOPRIL 10 MG PO TABS
10.0000 mg | ORAL_TABLET | Freq: Every day | ORAL | 1 refills | Status: DC
Start: 2020-07-30 — End: 2021-02-24

## 2020-08-07 ENCOUNTER — Encounter: Payer: Self-pay | Admitting: Family Medicine

## 2020-08-07 MED ORDER — HYDROCODONE-ACETAMINOPHEN 7.5-325 MG PO TABS: 1.0000 | ORAL_TABLET | Freq: Four times a day (QID) | ORAL | 0 refills | Status: DC | PRN

## 2020-09-12 ENCOUNTER — Other Ambulatory Visit: Payer: Self-pay | Admitting: Family Medicine

## 2020-09-12 MED ORDER — HYDROCODONE-ACETAMINOPHEN 7.5-325 MG PO TABS: 1.0000 | ORAL_TABLET | Freq: Four times a day (QID) | ORAL | 0 refills | Status: DC | PRN

## 2020-10-07 ENCOUNTER — Encounter: Payer: Self-pay | Admitting: Family Medicine

## 2020-10-07 MED ORDER — AMLODIPINE BESYLATE 5 MG PO TABS
5.0000 mg | ORAL_TABLET | Freq: Every day | ORAL | 1 refills | Status: DC
Start: 2020-10-07 — End: 2021-05-16

## 2020-10-17 ENCOUNTER — Encounter: Payer: Self-pay | Admitting: Family Medicine

## 2020-10-18 MED ORDER — HYDROCODONE-ACETAMINOPHEN 7.5-325 MG PO TABS
1.0000 | ORAL_TABLET | Freq: Four times a day (QID) | ORAL | 0 refills | Status: DC | PRN
Start: 2020-10-18 — End: 2020-11-21

## 2020-10-22 ENCOUNTER — Encounter: Payer: Self-pay | Admitting: Family Medicine

## 2020-10-22 ENCOUNTER — Ambulatory Visit: Payer: Medicare Other | Admitting: Family Medicine

## 2020-10-22 ENCOUNTER — Other Ambulatory Visit: Payer: Self-pay

## 2020-10-22 VITALS — BP 143/85 | HR 72 | Ht 68.0 in | Wt 190.4 lb

## 2020-10-22 DIAGNOSIS — R351 Nocturia: Secondary | ICD-10-CM | POA: Diagnosis not present

## 2020-10-22 DIAGNOSIS — R5383 Other fatigue: Secondary | ICD-10-CM

## 2020-10-22 DIAGNOSIS — R531 Weakness: Secondary | ICD-10-CM

## 2020-10-22 NOTE — Progress Notes (Signed)
Office Visit Note   Patient: Daniel Hurley           Date of Birth: 1955/12/30           MRN: [id number] Visit Date: 10/22/2020 Requested by: Lavada Mesi, MD [address] [place],  [place] [address] PCP: Lavada Mesi, MD  Subjective: Chief Complaint  Patient presents with  . tired, no energy    Weakness in lower body when he stands up.  . nocturia x 3 months    HPI: He is here with fatigue.  For the past 3 or 4 months, he has been getting progressively more tired.  He is not sleeping well at all, he has been having at least 3 or 4 episodes of nocturia.  He urinates frequently throughout the day as well.  He denies any burning or hematuria, denies any fevers or chills or flank pain.  He does not feel like he empties incompletely.  He has previously had blood sugars elevated in diabetes range and hemoglobin A1c in prediabetes range.  He is not eating healthfully.  He also feels like his legs are weak when he first stands up to walk.  He is not sure if it is related to pain.  He does struggle with chronic back pain.                ROS:   All other systems were reviewed and are negative.  Objective: Vital Signs: BP (!) 143/85   Pulse 72   Ht 5\' 8"  (1.727 m)   Wt 190 lb 6.4 oz (86.4 kg)   BMI 28.95 kg/m   Physical Exam:  General:  Alert and oriented, in no acute distress. Pulm:  Breathing unlabored. Psy:  Normal mood, congruent affect.  Neck: No thyromegaly or nodules.  No lymphadenopathy. CV: Regular rate and rhythm without murmurs, rubs, or gallops.  No peripheral edema.  2+ radial and posterior tibial pulses. Lungs: Clear to auscultation throughout with no wheezing or areas of consolidation. Extremities: 2+ upper and lower DTRs.  No nail deformities.    Imaging: No results found.  Assessment & Plan: 1.  Chronic fatigue, probably related to poor sleep related to nocturia. -We will draw labs to look for causes of fatigue, particularly to screen for  diabetes.  2.  Nocturia, question diabetes versus BPH -Labs to evaluate including urine culture.  3.  Chronic back pain     Procedures: No procedures performed        PMFS History: Patient Active Problem List   Diagnosis Date Noted  . Hypertension 02/12/2020  . Acute respiratory failure with hypoxia (HCC) 01/20/2016  . Chest pain 01/19/2016  . COPD exacerbation (HCC) 01/19/2016  . Tobacco abuse 01/19/2016   Past Medical History:  Diagnosis Date  . COPD (chronic obstructive pulmonary disease) (HCC)   . Hypertension 02/12/2020    History reviewed. No pertinent family history.  Past Surgical History:  Procedure Laterality Date  . ANKLE FUSION  2006  . BACK SURGERY  2008   L5-5 fusion   . left wrist surgery     Social History   Occupational History  . Not on file  Tobacco Use  . Smoking status: Current Every Day Smoker    Packs/day: 1.50    Types: Cigarettes  . Smokeless tobacco: Never Used  Substance and Sexual Activity  . Alcohol use: No  . Drug use: No  . Sexual activity: Not on file

## 2020-10-23 LAB — COMPREHENSIVE METABOLIC PANEL
AG Ratio: 1.9 (calc) (ref 1.0–2.5)
ALT: 16 U/L (ref 9–46)
AST: 13 U/L (ref 10–35)
Albumin: 4.7 g/dL (ref 3.6–5.1)
Alkaline phosphatase (APISO): 80 U/L (ref 35–144)
BUN: 15 mg/dL (ref 7–25)
CO2: 30 mmol/L (ref 20–32)
Calcium: 9.6 mg/dL (ref 8.6–10.3)
Chloride: 102 mmol/L (ref 98–110)
Creat: 0.99 mg/dL (ref 0.70–1.25)
Globulin: 2.5 g/dL (calc) (ref 1.9–3.7)
Glucose, Bld: 95 mg/dL (ref 65–99)
Potassium: 4.5 mmol/L (ref 3.5–5.3)
Sodium: 140 mmol/L (ref 135–146)
Total Bilirubin: 0.8 mg/dL (ref 0.2–1.2)
Total Protein: 7.2 g/dL (ref 6.1–8.1)

## 2020-10-23 LAB — CBC WITH DIFFERENTIAL/PLATELET
Absolute Monocytes: 1573 cells/uL — ABNORMAL HIGH (ref 200–950)
Basophils Absolute: 85 cells/uL (ref 0–200)
Basophils Relative: 0.7 %
Eosinophils Absolute: 133 cells/uL (ref 15–500)
Eosinophils Relative: 1.1 %
HCT: 48.8 % (ref 38.5–50.0)
Hemoglobin: 16.4 g/dL (ref 13.2–17.1)
Lymphs Abs: 2928 cells/uL (ref 850–3900)
MCH: 30.8 pg (ref 27.0–33.0)
MCHC: 33.6 g/dL (ref 32.0–36.0)
MCV: 91.6 fL (ref 80.0–100.0)
MPV: 12.4 fL (ref 7.5–12.5)
Monocytes Relative: 13 %
Neutro Abs: 7381 cells/uL (ref 1500–7800)
Neutrophils Relative %: 61 %
Platelets: 283 10*3/uL (ref 140–400)
RBC: 5.33 10*6/uL (ref 4.20–5.80)
RDW: 13 % (ref 11.0–15.0)
Total Lymphocyte: 24.2 %
WBC: 12.1 10*3/uL — ABNORMAL HIGH (ref 3.8–10.8)

## 2020-10-23 LAB — URINE CULTURE
MICRO NUMBER:: 11315347
Result:: NO GROWTH
SPECIMEN QUALITY:: ADEQUATE

## 2020-10-23 LAB — VITAMIN B12: Vitamin B-12: 422 pg/mL (ref 200–1100)

## 2020-10-23 LAB — IRON,TIBC AND FERRITIN PANEL
%SAT: 18 % (calc) — ABNORMAL LOW (ref 20–48)
Ferritin: 182 ng/mL (ref 24–380)
Iron: 68 ug/dL (ref 50–180)
TIBC: 377 mcg/dL (calc) (ref 250–425)

## 2020-10-23 LAB — HEMOGLOBIN A1C
Hgb A1c MFr Bld: 6 % of total Hgb — ABNORMAL HIGH (ref <5.7)
Mean Plasma Glucose: 126 mg/dL
eAG (mmol/L): 7 mmol/L

## 2020-10-23 LAB — PSA: PSA: 0.58 ng/mL (ref <=4.0)

## 2020-10-24 ENCOUNTER — Telehealth: Payer: Self-pay | Admitting: Family Medicine

## 2020-10-24 NOTE — Telephone Encounter (Signed)
Labs are notable for the following:  Urine looks normal, no sign of infection.  Prostate PSA is normal.  Hemoglobin A1c has risen to 6.0 indicating prediabetes.  This could be the cause of urinary symptoms as well as fatigue.  It is very important to minimize intake of processed carbohydrates including breads, pastas, cereals, sugars and sweets.  We should recheck labs in 3 to 4 months.  If worsening, we will consider medications at that point.  If desired, I can make a referral to a nutritionist for help and proper diet.  All else looks good.

## 2020-11-19 ENCOUNTER — Telehealth: Payer: Self-pay | Admitting: Family Medicine

## 2020-11-19 NOTE — Telephone Encounter (Signed)
PT needs you to call him about his mother.

## 2020-11-19 NOTE — Telephone Encounter (Signed)
Please send me a new message on her chart.

## 2020-11-21 ENCOUNTER — Encounter: Payer: Self-pay | Admitting: Family Medicine

## 2020-11-21 ENCOUNTER — Telehealth: Payer: Self-pay

## 2020-11-21 MED ORDER — PREDNISONE 10 MG PO TABS: ORAL_TABLET | ORAL | 0 refills | Status: DC

## 2020-11-21 MED ORDER — HYDROCODONE-ACETAMINOPHEN 7.5-325 MG PO TABS
1.0000 | ORAL_TABLET | Freq: Four times a day (QID) | ORAL | 0 refills | Status: DC | PRN
Start: 2020-11-21 — End: 2020-12-23

## 2020-11-21 NOTE — Telephone Encounter (Signed)
[facility] pharmacy called and would like to know why is patient being prescribed Norco. What is his diagnosis?   [phone] [phone]

## 2020-11-22 ENCOUNTER — Encounter: Payer: Self-pay | Admitting: Family Medicine

## 2020-11-22 DIAGNOSIS — G8929 Other chronic pain: Secondary | ICD-10-CM | POA: Insufficient documentation

## 2020-11-22 NOTE — Telephone Encounter (Signed)
Left this info on [facility]'s voice mail.

## 2020-11-22 NOTE — Telephone Encounter (Signed)
Chronic neck pain.

## 2020-11-22 NOTE — Telephone Encounter (Signed)
Please advise 

## 2020-12-18 ENCOUNTER — Encounter: Payer: Self-pay | Admitting: Family Medicine

## 2020-12-18 MED ORDER — CHLORZOXAZONE 500 MG PO TABS: 500.0000 mg | ORAL_TABLET | Freq: Four times a day (QID) | ORAL | 3 refills | Status: DC | PRN

## 2020-12-20 MED ORDER — CHLORZOXAZONE 500 MG PO TABS: 500.0000 mg | ORAL_TABLET | Freq: Four times a day (QID) | ORAL | 3 refills | Status: DC | PRN

## 2020-12-20 NOTE — Addendum Note (Signed)
Addended by: Lillia Carmel on: 12/20/2020 12:51 PM   Modules accepted: Orders

## 2020-12-23 ENCOUNTER — Encounter: Payer: Self-pay | Admitting: Family Medicine

## 2020-12-23 MED ORDER — SILDENAFIL CITRATE 20 MG PO TABS
20.0000 mg | ORAL_TABLET | Freq: Two times a day (BID) | ORAL | 11 refills | Status: AC
Start: 2020-12-23 — End: ?

## 2020-12-23 MED ORDER — HYDROCODONE-ACETAMINOPHEN 7.5-325 MG PO TABS: 1.0000 | ORAL_TABLET | Freq: Four times a day (QID) | ORAL | 0 refills | Status: DC | PRN

## 2021-01-15 ENCOUNTER — Other Ambulatory Visit: Payer: Self-pay

## 2021-01-15 ENCOUNTER — Encounter: Payer: Self-pay | Admitting: Family Medicine

## 2021-01-15 ENCOUNTER — Ambulatory Visit (INDEPENDENT_AMBULATORY_CARE_PROVIDER_SITE_OTHER): Payer: Medicare Other | Admitting: Family Medicine

## 2021-01-15 VITALS — BP 119/80 | HR 68 | Temp 97.8°F | Ht 68.0 in | Wt 196.4 lb

## 2021-01-15 DIAGNOSIS — I10 Essential (primary) hypertension: Secondary | ICD-10-CM

## 2021-01-15 DIAGNOSIS — M25561 Pain in right knee: Secondary | ICD-10-CM

## 2021-01-15 DIAGNOSIS — R059 Cough, unspecified: Secondary | ICD-10-CM

## 2021-01-15 DIAGNOSIS — M25562 Pain in left knee: Secondary | ICD-10-CM

## 2021-01-15 DIAGNOSIS — G8929 Other chronic pain: Secondary | ICD-10-CM

## 2021-01-15 DIAGNOSIS — R0600 Dyspnea, unspecified: Secondary | ICD-10-CM | POA: Diagnosis not present

## 2021-01-15 DIAGNOSIS — R0609 Other forms of dyspnea: Secondary | ICD-10-CM

## 2021-01-15 MED ORDER — PREDNISONE 10 MG PO TABS: 40.0000 mg | ORAL_TABLET | Freq: Every day | ORAL | 0 refills | Status: DC

## 2021-01-15 NOTE — Progress Notes (Signed)
Office Visit Note   Patient: Daniel Hurley           Date of Birth: Oct 01, 1956           MRN: [id number] Visit Date: 01/15/2021 Requested by: Lavada Mesi, MD [address] [place],  [place] [address] PCP: Lavada Mesi, MD  Subjective: Chief Complaint  Patient presents with  . Right Knee - Pain  . Left Knee - Pain  . shortness of breath    Cough - productive of yellow sputum at times, clear at others Had an episode 4 days ago, of pain going from the neck and down the left arm    HPI: He is here with multiple complaints.  His primary concern is shortness of breath.  This has gotten progressively worse over the past several months.  2 years ago he was hospitalized, cardiac source was ruled out.  He had a stress echocardiogram which was normal.  He has had ongoing intermittent dyspnea and has mild emphysema.  Lately for the past 2 or 3 weeks he has trouble walking to the mailbox, and trouble sleeping on his back at night when he puts his hand on his chest.  His neck started hurting 2 or 3 days ago with pain radiating into the left arm.  It is feeling mostly better today.  His knees are bothering him as well.  He thinks it is from inactivity.  He has not been working lately due to the weather.              ROS:   All other systems were reviewed and are negative.  Objective: Vital Signs: BP 119/80   Pulse 68   Temp 97.8 F (36.6 C)   Ht 5\' 8"  (1.727 m)   Wt 196 lb 6.4 oz (89.1 kg)   SpO2 97%   BMI 29.86 kg/m   Physical Exam:  General:  Alert and oriented, in no acute distress. Pulm:  Breathing unlabored. Psy:  Normal mood, congruent affect.  Neck: 2+ carotid pulses with no bruits.  No lymphadenopathy. Lungs: He has diminished breath sounds throughout.  He has expiratory wheezing bilaterally.  No areas of consolidation. Heart: Regular rate and rhythm without murmurs, rubs, or gallops. Extremities: No peripheral edema. Knees: No effusions.  No joint line  tenderness.  Imaging: No results found.  Assessment & Plan: 1.  Worsening dyspnea, suspect due to COPD. -Short course of prednisone.  Referral to pulmonology.  2.  Bilateral knee pain - Prednisone, inject if persists.  3.  Neck and left arm pain, improving.     Procedures: No procedures performed        PMFS History: Patient Active Problem List   Diagnosis Date Noted  . Chronic neck pain 11/22/2020  . Hypertension 02/12/2020  . Acute respiratory failure with hypoxia (HCC) 01/20/2016  . Chest pain 01/19/2016  . COPD exacerbation (HCC) 01/19/2016  . Tobacco abuse 01/19/2016   Past Medical History:  Diagnosis Date  . COPD (chronic obstructive pulmonary disease) (HCC)   . Hypertension 02/12/2020    History reviewed. No pertinent family history.  Past Surgical History:  Procedure Laterality Date  . ANKLE FUSION  2006  . BACK SURGERY  2008   L5-5 fusion   . left wrist surgery     Social History   Occupational History  . Not on file  Tobacco Use  . Smoking status: Current Every Day Smoker    Packs/day: 1.50    Types: Cigarettes  .  Smokeless tobacco: Never Used  Substance and Sexual Activity  . Alcohol use: No  . Drug use: No  . Sexual activity: Not on file

## 2021-01-29 ENCOUNTER — Encounter: Payer: Self-pay | Admitting: Family Medicine

## 2021-01-29 MED ORDER — HYDROCODONE-ACETAMINOPHEN 7.5-325 MG PO TABS: 1.0000 | ORAL_TABLET | Freq: Four times a day (QID) | ORAL | 0 refills | Status: DC | PRN

## 2021-02-22 ENCOUNTER — Other Ambulatory Visit: Payer: Self-pay | Admitting: Family Medicine

## 2021-02-28 ENCOUNTER — Other Ambulatory Visit: Payer: Self-pay | Admitting: Family Medicine

## 2021-02-28 MED ORDER — PREDNISONE 10 MG PO TABS: 40.0000 mg | ORAL_TABLET | Freq: Every day | ORAL | 0 refills | Status: DC

## 2021-03-14 ENCOUNTER — Encounter: Payer: Self-pay | Admitting: Family Medicine

## 2021-03-17 MED ORDER — HYDROCODONE-ACETAMINOPHEN 7.5-325 MG PO TABS: 1.0000 | ORAL_TABLET | Freq: Four times a day (QID) | ORAL | 0 refills | Status: DC | PRN

## 2021-04-17 ENCOUNTER — Encounter: Payer: Self-pay | Admitting: Family Medicine

## 2021-04-18 MED ORDER — PREDNISONE 10 MG PO TABS: ORAL_TABLET | ORAL | 0 refills | Status: DC

## 2021-04-18 MED ORDER — HYDROCODONE-ACETAMINOPHEN 7.5-325 MG PO TABS: 1.0000 | ORAL_TABLET | Freq: Four times a day (QID) | ORAL | 0 refills | Status: DC | PRN

## 2021-04-18 NOTE — Telephone Encounter (Signed)
Please advise 

## 2021-05-06 ENCOUNTER — Institutional Professional Consult (permissible substitution): Payer: Medicare Other | Admitting: Pulmonary Disease

## 2021-05-16 ENCOUNTER — Ambulatory Visit (INDEPENDENT_AMBULATORY_CARE_PROVIDER_SITE_OTHER): Payer: Medicare Other | Admitting: Family Medicine

## 2021-05-16 ENCOUNTER — Encounter: Payer: Self-pay | Admitting: Family Medicine

## 2021-05-16 ENCOUNTER — Other Ambulatory Visit: Payer: Self-pay

## 2021-05-16 VITALS — BP 122/83 | HR 77 | Resp 24 | Ht 68.0 in | Wt 191.4 lb

## 2021-05-16 DIAGNOSIS — R5383 Other fatigue: Secondary | ICD-10-CM

## 2021-05-16 DIAGNOSIS — R0600 Dyspnea, unspecified: Secondary | ICD-10-CM | POA: Diagnosis not present

## 2021-05-16 DIAGNOSIS — R0609 Other forms of dyspnea: Secondary | ICD-10-CM

## 2021-05-16 MED ORDER — AMLODIPINE BESYLATE 5 MG PO TABS: 5.0000 mg | ORAL_TABLET | Freq: Every day | ORAL | 3 refills | Status: AC

## 2021-05-16 MED ORDER — LISINOPRIL 10 MG PO TABS: 10.0000 mg | ORAL_TABLET | Freq: Every day | ORAL | 3 refills | Status: DC

## 2021-05-16 NOTE — Progress Notes (Signed)
   Office Visit Note   Patient: Daniel Hurley           Date of Birth: 11-24-1955           MRN: [id number] Visit Date: 05/16/2021 Requested by: Lavada Mesi, MD [address] [place],  [place] [address] PCP: Lavada Mesi, MD  Subjective: Chief Complaint  Patient presents with   Other    Fatigue and check a skin lesion on the back    HPI: He is here for follow-up shortness of breath with exertion.  Symptoms have not improved, in fact he is a little bit worse with the hot weather.  Because of his brother's recent death, he was unable to go to the pulmonologist.  He plans to reschedule that visit.  He does note that using Advair twice a day recently seemed to help, and he still gets relief when he takes some prednisone intermittently.  He has a skin lesion on his left upper back that his wife was concerned about.                ROS:   All other systems were reviewed and are negative.  Objective: Vital Signs: BP 122/83 (BP Location: Right Arm, Patient Position: Sitting, Cuff Size: Normal)   Pulse 77   Resp (!) 24   Ht 5\' 8"  (1.727 m)   Wt 191 lb 6.4 oz (86.8 kg)   SpO2 93%   BMI 29.10 kg/m   Physical Exam:  General:  Alert and oriented, in no acute distress. Pulm:  Breathing unlabored. Psy:  Normal mood, congruent affect. Skin: He has what appears to be a seborrheic keratosis left upper back.  A photo was taken for his chart today. Lungs: Breath sounds are diminished throughout, no wheezing heard.  Imaging: No results found.  Assessment & Plan: Ongoing dyspnea and fatigue, suspect due to COPD -Proceed with pulmonary consult.  Start using Advair twice daily.  2.  Skin lesion of left upper back, most likely seborrheic keratosis -We will monitor periodically.     Procedures: No procedures performed        PMFS History: Patient Active Problem List   Diagnosis Date Noted   Chronic neck pain 11/22/2020   Hypertension 02/12/2020   Acute respiratory failure  with hypoxia (HCC) 01/20/2016   Chest pain 01/19/2016   COPD exacerbation (HCC) 01/19/2016   Tobacco abuse 01/19/2016   Past Medical History:  Diagnosis Date   COPD (chronic obstructive pulmonary disease) (HCC)    Hypertension 02/12/2020    History reviewed. No pertinent family history.  Past Surgical History:  Procedure Laterality Date   ANKLE FUSION  2006   BACK SURGERY  2008   L5-5 fusion    left wrist surgery     Social History   Occupational History   Not on file  Tobacco Use   Smoking status: Every Day    Packs/day: 1.50    Pack years: 0.00    Types: Cigarettes   Smokeless tobacco: Never  Substance and Sexual Activity   Alcohol use: No   Drug use: No   Sexual activity: Not on file

## 2021-06-10 ENCOUNTER — Encounter: Payer: Self-pay | Admitting: Family Medicine

## 2021-06-10 MED ORDER — HYDROCODONE-ACETAMINOPHEN 7.5-325 MG PO TABS: 1.0000 | ORAL_TABLET | Freq: Four times a day (QID) | ORAL | 0 refills | Status: DC | PRN

## 2021-06-24 ENCOUNTER — Encounter: Payer: Self-pay | Admitting: Family Medicine

## 2021-06-24 MED ORDER — PREDNISONE 10 MG PO TABS: 40.0000 mg | ORAL_TABLET | Freq: Every day | ORAL | 0 refills | Status: DC

## 2021-06-25 ENCOUNTER — Other Ambulatory Visit: Payer: Self-pay

## 2021-06-25 ENCOUNTER — Ambulatory Visit (INDEPENDENT_AMBULATORY_CARE_PROVIDER_SITE_OTHER): Payer: Medicare Other | Admitting: Family Medicine

## 2021-06-25 ENCOUNTER — Encounter: Payer: Self-pay | Admitting: Family Medicine

## 2021-06-25 DIAGNOSIS — F418 Other specified anxiety disorders: Secondary | ICD-10-CM

## 2021-06-25 DIAGNOSIS — M25532 Pain in left wrist: Secondary | ICD-10-CM

## 2021-06-25 MED ORDER — CLONAZEPAM 0.5 MG PO TABS: 0.2500 mg | ORAL_TABLET | Freq: Two times a day (BID) | ORAL | 0 refills | Status: DC | PRN

## 2021-06-25 NOTE — Progress Notes (Signed)
   Office Visit Note   Patient: Daniel Hurley           Date of Birth: [birth year]           MRN: [id number] Visit Date: 06/25/2021 Requested by: Lavada Mesi, MD [address] [place],  [place] [address] PCP: Lavada Mesi, MD  Subjective: Chief Complaint  Patient presents with   Left Wrist - Pain    Numbness in the hand - the fingers of the hand lock up sometimes, like when holding a cigarette and his phone. Right-hand dominant.     HPI: He is here with left hand numbness.  He is status post injury to his wrist years ago, breakthrough glass and sustained injury to the arteries in his wrist.  He underwent 2 separate surgeries and eventually did okay.  Lately he has been having numbness and tingling in his fingers and a cramping sensation.  He is right-hand dominant.  He is currently taking prednisone for other reasons but it is not helping his wrist symptoms.  He is also been under a great deal of stress with things going on in his family.  He would like something temporary to calm down his nerves.                ROS:   All other systems were reviewed and are negative.  Objective: Vital Signs: There were no vitals taken for this visit.  Physical Exam:  General:  Alert and oriented, in no acute distress. Pulm:  Breathing unlabored. Psy:  Normal mood, congruent affect. Skin: There is extensive scarring on the volar aspect of his left wrist. Left hand: No thenar atrophy.  Intrinsic hand strength is normal.  Equivocal Tinel's at the carpal tunnel, positive Phalen's test.    Imaging: No results found.  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  Injection if symptoms worsen.  2.  Situational anxiety - Klonopin as needed to use sparingly.     Procedures: No procedures performed        PMFS History: Patient Active Problem List   Diagnosis Date Noted   Chronic neck pain 11/22/2020   Hypertension  02/12/2020   Acute respiratory failure with hypoxia (HCC) 01/20/2016   Chest pain 01/19/2016   COPD exacerbation (HCC) 01/19/2016   Tobacco abuse 01/19/2016   Past Medical History:  Diagnosis Date   COPD (chronic obstructive pulmonary disease) (HCC)    Hypertension 02/12/2020    History reviewed. No pertinent family history.  Past Surgical History:  Procedure Laterality Date   ANKLE FUSION  2006   BACK SURGERY  2008   L5-5 fusion    left wrist surgery     Social History   Occupational History   Not on file  Tobacco Use   Smoking status: Every Day    Packs/day: 1.50    Types: Cigarettes   Smokeless tobacco: Never  Substance and Sexual Activity   Alcohol use: No   Drug use: No   Sexual activity: Not on file

## 2021-07-16 ENCOUNTER — Other Ambulatory Visit: Payer: Self-pay | Admitting: Orthopaedic Surgery

## 2021-07-16 ENCOUNTER — Telehealth: Payer: Self-pay

## 2021-07-16 MED ORDER — FLUTICASONE-SALMETEROL 500-50 MCG/ACT IN AEPB: 1.0000 | INHALATION_SPRAY | Freq: Two times a day (BID) | RESPIRATORY_TRACT | 6 refills | Status: DC

## 2021-07-16 NOTE — Telephone Encounter (Signed)
Refill request for Advair (Daniel Hurley) Erick Alley

## 2021-07-21 ENCOUNTER — Other Ambulatory Visit: Payer: Self-pay

## 2021-07-21 MED ORDER — FLUTICASONE-SALMETEROL 500-50 MCG/ACT IN AEPB: 1.0000 | INHALATION_SPRAY | Freq: Two times a day (BID) | RESPIRATORY_TRACT | 6 refills | Status: DC

## 2022-07-01 ENCOUNTER — Telehealth: Payer: Self-pay | Admitting: Cardiovascular Disease

## 2022-07-01 NOTE — Telephone Encounter (Signed)
Pt c/o of Chest Pain: STAT if CP now or developed within 24 hours  1. Are you having CP right now?   No  2. Are you experiencing any other symptoms (ex. SOB, nausea, vomiting, sweating)?  SOB  3. How long have you been experiencing CP?  Intermittent since this past weekend  4. Is your CP continuous or coming and going? Coming and going  5. Have you taken Nitroglycerin?   None  Wife called stating patient had an EKG in doctor's office which showed an abnormal reading which could indicate a blockage.  Wife is requesting a sooner appointment.

## 2022-07-01 NOTE — Telephone Encounter (Signed)
Called patient back. Dr. Oleta Mouse called [name] earlier this morning. [name] had recommended patient to go to the ED, that we would contact our team at the hospital and let them know he was coming. Patient never went. Patient is refusing to go at this time. Patient stated he will go to the ED if he feels like he needs to go. Will make patient the first available appointment with DOD, on Monday. Patient could not see [name] or DOD on Friday, due to other plans. Reminded patient that there is nothing more important than taking care of his heart. Patient stated he understood, but if he starts to feel worse he will go to the ED.

## 2022-07-03 ENCOUNTER — Ambulatory Visit: Payer: Medicare Other | Admitting: Cardiovascular Disease

## 2022-07-06 ENCOUNTER — Ambulatory Visit: Payer: Medicare Other | Attending: Cardiovascular Disease | Admitting: Cardiovascular Disease

## 2022-07-06 ENCOUNTER — Encounter: Payer: Self-pay | Admitting: Cardiovascular Disease

## 2022-07-06 VITALS — BP 120/86 | HR 71 | Ht 68.0 in | Wt 196.6 lb

## 2022-07-06 DIAGNOSIS — I10 Essential (primary) hypertension: Secondary | ICD-10-CM | POA: Diagnosis not present

## 2022-07-06 DIAGNOSIS — Z01810 Encounter for preprocedural cardiovascular examination: Secondary | ICD-10-CM | POA: Diagnosis not present

## 2022-07-06 DIAGNOSIS — E782 Mixed hyperlipidemia: Secondary | ICD-10-CM

## 2022-07-06 DIAGNOSIS — I209 Angina pectoris, unspecified: Secondary | ICD-10-CM

## 2022-07-06 LAB — CBC

## 2022-07-06 MED ORDER — ASPIRIN 81 MG PO TBEC: 81.0000 mg | DELAYED_RELEASE_TABLET | Freq: Every day | ORAL | 3 refills | Status: DC

## 2022-07-06 MED ORDER — NITROGLYCERIN 0.4 MG SL SUBL: SUBLINGUAL_TABLET | SUBLINGUAL | 3 refills | Status: DC

## 2022-07-06 NOTE — Patient Instructions (Addendum)
Medication Instructions:  START Nitroglycerin as needed for chest pain START Aspirin 81mg  daily *If you need a refill on your cardiac medications before your next appointment, please call your pharmacy*   Lab Work: CBC, BMET, LIPIDS Today If you have labs (blood work) drawn today and your tests are completely normal, you will receive your results only by: MyChart Message (if you have MyChart) OR A paper copy in the mail If you have any lab test that is abnormal or we need to change your treatment, we will call you to review the results.   Testing/Procedures: L heart Catheterization Your physician has requested that you have a cardiac catheterization. Cardiac catheterization is used to diagnose and/or treat various heart conditions. Doctors may recommend this procedure for a number of different reasons. The most common reason is to evaluate chest pain. Chest pain can be a symptom of coronary artery disease (CAD), and cardiac catheterization can show whether plaque is narrowing or blocking your heart's arteries. This procedure is also used to evaluate the valves, as well as measure the blood flow and oxygen levels in different parts of your heart. For further information please visit . Please follow instruction sheet, as given.  Follow-Up: At [facility], you and your health needs are our priority.  As part of our continuing mission to provide you with exceptional heart care, we have created designated Provider Care Teams.  These Care Teams include your primary Cardiologist (physician) and Advanced Practice Providers (APPs -  Physician Assistants and Nurse Practitioners) who all work together to provide you with the care you need, when you need it.  We recommend signing up for the patient portal called "MyChart".  Sign up information is provided on this After Visit Summary.  MyChart is used to connect with patients for Virtual Visits (Telemedicine).  Patients are able  to view lab/test results, encounter notes, upcoming appointments, etc.  Non-urgent messages can be sent to your provider as well.   To learn more about what you can do with MyChart, go to [facility].    Your next appointment:   1 year  The format for your next appointment:   In Person  Provider:   [email or web address], MD         [facility] A DEPT OF [place]. [facility] ST A DEPT OF [facility] [address] [place], [place] [address] [place] [facility] [place] Dept: [phone] Loc: [phone]  Daniel Hurley  07/06/2022  You are scheduled for a Cardiac Catheterization on Wednesday, August 30 with Dr. September 01.  1. Please arrive at the [facility] (Main Entrance A) at [facility]: [address] [place], [place] at 8:00 AM (This time is two hours before your procedure to ensure your preparation). Free valet parking service is available.   Special note: Every effort is made to have your procedure done on time. Please understand that emergencies sometimes delay scheduled procedures.  2. Diet: Do not eat solid foods after midnight.  The patient may have clear liquids until 5am upon the day of the procedure.  3. Labs: TODAY  4. Medication instructions in preparation for your procedure:   Contrast Allergy: No  On the morning of your procedure, take your Aspirin 81mg  and any morning medicines NOT listed above.  You may use sips of water.  5. Plan for one night stay--bring personal belongings. 6. Bring a current list of your medications and  current insurance cards. 7. You MUST have a responsible person to drive you home. 8. Someone MUST be with you the first 24 hours after you arrive home or your discharge will be delayed. 9. Please wear clothes that are easy to get on and off and wear slip-on shoes.  Thank you for allowing [place] to care for you!   -- [place] [facility]  Important Information About Sugar

## 2022-07-06 NOTE — Progress Notes (Signed)
Cardiology Office Note:    Date:  07/06/2022   ID:  Daniel Hurley, DOB 12-16-1955, MRN [id number]  PCP:  Lavada Mesi, MD   [facility] Providers Cardiologist:  Charlton Haws, MD     Referring MD: Lavada Mesi, MD   Chief Complaint  Patient presents with   Chest Pain    History of Present Illness:    Daniel Hurley is a 66 y.o. male followed by [name], last seen in November 2019 for evaluation of exertional dyspnea and chest pain.  He had a normal stress echocardiogram at that time with no evidence of wall motion abnormality or pulmonary hypertension.  Over the past week he has had 'stabbing pains' in the chest and has felt like there is 'brick' on his chest. He has chronic shortness of breath that has worsened over the past few weeks. He is here with his wife today and she reports that he has been under a lot of stress related to family issues. He felt a soreness in his chest overnight as well and continues to feel that today. He has a chronic cough and still smokes at least 1 ppd. No recent fever, chills, orthopnea, PND, or edema.  The patient reports that he would not be able to walk on a treadmill for stress test due to marked dyspnea.  He has not taken nitroglycerin to date.  Past Medical History:  Diagnosis Date   COPD (chronic obstructive pulmonary disease) (HCC)    Hypertension 02/12/2020    Past Surgical History:  Procedure Laterality Date   ANKLE FUSION  2006   BACK SURGERY  2008   L5-5 fusion    left wrist surgery      Current Medications: Current Meds  Medication Sig   albuterol (VENTOLIN HFA) 108 (90 Base) MCG/ACT inhaler Inhale 2 puffs into the lungs every 6 (six) hours as needed for wheezing or shortness of breath.   amLODipine (NORVASC) 5 MG tablet Take 1 tablet (5 mg total) by mouth daily.   aspirin EC 81 MG tablet Take 1 tablet (81 mg total) by mouth daily. Swallow whole.   chlorzoxazone (PARAFON FORTE DSC) 500 MG tablet Take 1 tablet  (500 mg total) by mouth 4 (four) times daily as needed for muscle spasms.   clonazePAM (KLONOPIN) 0.5 MG tablet Take 0.5-1 tablets (0.25-0.5 mg total) by mouth 2 (two) times daily as needed for anxiety.   fluticasone-salmeterol (ADVAIR DISKUS) 500-50 MCG/ACT AEPB Inhale 1 puff into the lungs in the morning and at bedtime.   HYDROcodone-acetaminophen (NORCO) 7.5-325 MG tablet Take 1 tablet by mouth every 6 (six) hours as needed for moderate pain.   lisinopril (ZESTRIL) 10 MG tablet Take 1 tablet (10 mg total) by mouth daily.   nitroGLYCERIN (NITROSTAT) 0.4 MG SL tablet Dissolve 1 tablet under the tongue every 5 minutes as needed for chest pain. Max of 3 doses, then 911.   predniSONE (DELTASONE) 10 MG tablet Take as directed for 12 days.  Daily dose 6,6,5,5,4,4,3,3,2,2,1,1.   sildenafil (REVATIO) 20 MG tablet Take 1 tablet (20 mg total) by mouth 2 (two) times daily.     Allergies:   Patient has no known allergies.   Social History   Socioeconomic History   Marital status: Married    Spouse name: Not on file   Number of children: Not on file   Years of education: Not on file   Highest education level: Not on file  Occupational History   Not on  file  Tobacco Use   Smoking status: Every Day    Packs/day: 1.50    Types: Cigarettes   Smokeless tobacco: Never  Substance and Sexual Activity   Alcohol use: No   Drug use: No   Sexual activity: Not on file  Other Topics Concern   Not on file  Social History Narrative   Not on file   Social Determinants of Health   Financial Resource Strain: Not on file  Food Insecurity: Not on file  Transportation Needs: Not on file  Physical Activity: Not on file  Stress: Not on file  Social Connections: Not on file     Family History: The patient's family history is not on file.  ROS:   Please see the history of present illness.    All other systems reviewed and are negative.  EKGs/Labs/Other Studies Reviewed:    The following studies  were reviewed today: Stress Echo 09/19/2018: Impressions:   - Normal exercise stress echo.    Normal hemodynamic response    No arrhythmias   CT Chest 2019: IMPRESSION: 1. There is soft tissue asymmetry in the low neck in the region the right piriform sinus of uncertain significance. Either MRI of the neck or ENT assessment may be warranted. Correlate clinically. 2. No pneumonia or pleural effusion. 3. Mild centrilobular emphysema. 4. Mild thoracic aortic atherosclerosis with coronary artery calcifications present as noted above. 5. Single nonobstructing 7 mm left renal calculus.  EKG:  EKG is ordered today.  The ekg ordered today demonstrates NSR 71 bpm, within normal limits  Recent Labs: No results found for requested labs within last 365 days.  Recent Lipid Panel    Component Value Date/Time   CHOL 170 02/12/2020 1541   TRIG 102 02/12/2020 1541   HDL 48 02/12/2020 1541   CHOLHDL 3.5 02/12/2020 1541   VLDL 17 01/20/2016 0620   LDLCALC 102 (H) 02/12/2020 1541     Risk Assessment/Calculations:                Physical Exam:    VS:  BP 120/86   Pulse 71   Ht 5\' 8"  (1.727 m)   Wt 196 lb 9.6 oz (89.2 kg)   SpO2 94%   BMI 29.89 kg/m     Wt Readings from Last 3 Encounters:  07/06/22 196 lb 9.6 oz (89.2 kg)  05/16/21 191 lb 6.4 oz (86.8 kg)  01/15/21 196 lb 6.4 oz (89.1 kg)     GEN:  Well nourished, well developed in no acute distress HEENT: Normal NECK: No JVD; No carotid bruits LYMPHATICS: No lymphadenopathy CARDIAC: RRR, no murmurs, rubs, gallops RESPIRATORY:  Clear to auscultation without rales, wheezing or rhonchi  ABDOMEN: Soft, non-tender, non-distended MUSCULOSKELETAL:  No edema; No deformity  SKIN: Warm and dry NEUROLOGIC:  Alert and oriented x 3 PSYCHIATRIC:  Normal affect   ASSESSMENT:    1. Angina pectoris (HCC)   2. Mixed hyperlipidemia   3. Essential hypertension   4. Pre-procedural cardiovascular examination    PLAN:    In order  of problems listed above:  The patient has chest pain and left arm pain with some typical and atypical features.  He is at high risk of coronary artery disease with extensive smoking history, prediabetes, and hypertension.  We discussed potential diagnostic options with either noninvasive or invasive testing.  After a shared decision-making conversation, the patient will be referred for cardiac catheterization and possible angioplasty/stenting.  I think there is a high pretest probability  of obstructive CAD based on his risk profile.  His EKG today is reassuring that he is not having acute ischemia. I have reviewed the risks, indications, and alternatives to cardiac catheterization, possible angioplasty, and stenting with the patient. Risks include but are not limited to bleeding, infection, vascular injury, stroke, myocardial infection, arrhythmia, kidney injury, radiation-related injury in the case of prolonged fluoroscopy use, emergency cardiac surgery, and death. The patient understands the risks of serious complication is 1-2 in 1000 with diagnostic cardiac cath and 1-2% or less with angioplasty/stenting.  The patient will be started on aspirin 81 mg daily.  I will add a lipid panel to his pre-catheterization labs.  He will likely need to be started on a statin drug if tolerated. Add lipid panel to labs.  Goal LDL cholesterol will be less than 70 mg/dL in the context of his aortic atherosclerosis. Blood pressure controlled on amlodipine and lisinopril.  Continue same. Risk reduction measures as above with treatment of his hypertension, likely addition of a statin based on his lipid panel results, and addition of aspirin 81 mg daily.      Shared Decision Making/Informed Consent The risks [stroke (1 in 1000), death (1 in 1000), kidney failure [usually temporary] (1 in 500), bleeding (1 in 200), allergic reaction [possibly serious] (1 in 200)], benefits (diagnostic support and management of coronary  artery disease) and alternatives of a cardiac catheterization were discussed in detail with Mr. Dubuc and he is willing to proceed.    Medication Adjustments/Labs and Tests Ordered: Current medicines are reviewed at length with the patient today.  Concerns regarding medicines are outlined above.  Orders Placed This Encounter  Procedures   CBC   Basic metabolic panel   Lipid panel   EKG 12-Lead   Meds ordered this encounter  Medications   nitroGLYCERIN (NITROSTAT) 0.4 MG SL tablet    Sig: Dissolve 1 tablet under the tongue every 5 minutes as needed for chest pain. Max of 3 doses, then 911.    Dispense:  90 tablet    Refill:  3   aspirin EC 81 MG tablet    Sig: Take 1 tablet (81 mg total) by mouth daily. Swallow whole.    Dispense:  90 tablet    Refill:  3    Patient Instructions  Medication Instructions:  START Nitroglycerin as needed for chest pain START Aspirin 81mg  daily *If you need a refill on your cardiac medications before your next appointment, please call your pharmacy*   Lab Work: CBC, BMET, LIPIDS Today If you have labs (blood work) drawn today and your tests are completely normal, you will receive your results only by: MyChart Message (if you have MyChart) OR A paper copy in the mail If you have any lab test that is abnormal or we need to change your treatment, we will call you to review the results.   Testing/Procedures: L heart Catheterization Your physician has requested that you have a cardiac catheterization. Cardiac catheterization is used to diagnose and/or treat various heart conditions. Doctors may recommend this procedure for a number of different reasons. The most common reason is to evaluate chest pain. Chest pain can be a symptom of coronary artery disease (CAD), and cardiac catheterization can show whether plaque is narrowing or blocking your heart's arteries. This procedure is also used to evaluate the valves, as well as measure the blood flow and  oxygen levels in different parts of your heart. For further information please visit . Please follow instruction  sheet, as given.  Follow-Up: At [facility], you and your health needs are our priority.  As part of our continuing mission to provide you with exceptional heart care, we have created designated Provider Care Teams.  These Care Teams include your primary Cardiologist (physician) and Advanced Practice Providers (APPs -  Physician Assistants and Nurse Practitioners) who all work together to provide you with the care you need, when you need it.  We recommend signing up for the patient portal called "MyChart".  Sign up information is provided on this After Visit Summary.  MyChart is used to connect with patients for Virtual Visits (Telemedicine).  Patients are able to view lab/test results, encounter notes, upcoming appointments, etc.  Non-urgent messages can be sent to your provider as well.   To learn more about what you can do with MyChart, go to [email or web address].    Your next appointment:   1 year  The format for your next appointment:   In Person  Provider:   Charlton Haws, MD         [place] [facility] A DEPT OF [place]. [facility] ST A DEPT OF [facility] [address] [place], [place] [address] [id number] [facility] [place] [address] Dept: [phone] Loc: [phone]  RAYMAN PETROSIAN  07/06/2022  You are scheduled for a Cardiac Catheterization on Wednesday, August 30 with Dr. Verne Carrow.  1. Please arrive at the [facility] (Main Entrance A) at [facility]: [address] [place], [place] [address] at 8:00 AM (This time is two hours before your procedure to ensure your preparation). Free valet parking service is available.   Special note: Every effort is made to have your procedure done on time. Please understand that emergencies sometimes delay scheduled  procedures.  2. Diet: Do not eat solid foods after midnight.  The patient may have clear liquids until 5am upon the day of the procedure.  3. Labs: TODAY  4. Medication instructions in preparation for your procedure:   Contrast Allergy: No  On the morning of your procedure, take your Aspirin 81mg  and any morning medicines NOT listed above.  You may use sips of water.  5. Plan for one night stay--bring personal belongings. 6. Bring a current list of your medications and current insurance cards. 7. You MUST have a responsible person to drive you home. 8. Someone MUST be with you the first 24 hours after you arrive home or your discharge will be delayed. 9. Please wear clothes that are easy to get on and off and wear slip-on shoes.  Thank you for allowing to care for you!   -- [place] [facility]  Important Information About Sugar        Signed, [place], MD  07/06/2022 9:27 AM    [facility]

## 2022-07-06 NOTE — H&P (View-Only) (Signed)
Cardiology Office Note:    Date:  07/06/2022   ID:  Daniel Hurley, DOB 12-16-1955, MRN [id number]  PCP:  Lavada Mesi, MD   [facility] Providers Cardiologist:  Charlton Haws, MD     Referring MD: Lavada Mesi, MD   Chief Complaint  Patient presents with   Chest Pain    History of Present Illness:    Daniel Hurley is a 66 y.o. male followed by [name], last seen in November 2019 for evaluation of exertional dyspnea and chest pain.  He had a normal stress echocardiogram at that time with no evidence of wall motion abnormality or pulmonary hypertension.  Over the past week he has had 'stabbing pains' in the chest and has felt like there is 'brick' on his chest. He has chronic shortness of breath that has worsened over the past few weeks. He is here with his wife today and she reports that he has been under a lot of stress related to family issues. He felt a soreness in his chest overnight as well and continues to feel that today. He has a chronic cough and still smokes at least 1 ppd. No recent fever, chills, orthopnea, PND, or edema.  The patient reports that he would not be able to walk on a treadmill for stress test due to marked dyspnea.  He has not taken nitroglycerin to date.  Past Medical History:  Diagnosis Date   COPD (chronic obstructive pulmonary disease) (HCC)    Hypertension 02/12/2020    Past Surgical History:  Procedure Laterality Date   ANKLE FUSION  2006   BACK SURGERY  2008   L5-5 fusion    left wrist surgery      Current Medications: Current Meds  Medication Sig   albuterol (VENTOLIN HFA) 108 (90 Base) MCG/ACT inhaler Inhale 2 puffs into the lungs every 6 (six) hours as needed for wheezing or shortness of breath.   amLODipine (NORVASC) 5 MG tablet Take 1 tablet (5 mg total) by mouth daily.   aspirin EC 81 MG tablet Take 1 tablet (81 mg total) by mouth daily. Swallow whole.   chlorzoxazone (PARAFON FORTE DSC) 500 MG tablet Take 1 tablet  (500 mg total) by mouth 4 (four) times daily as needed for muscle spasms.   clonazePAM (KLONOPIN) 0.5 MG tablet Take 0.5-1 tablets (0.25-0.5 mg total) by mouth 2 (two) times daily as needed for anxiety.   fluticasone-salmeterol (ADVAIR DISKUS) 500-50 MCG/ACT AEPB Inhale 1 puff into the lungs in the morning and at bedtime.   HYDROcodone-acetaminophen (NORCO) 7.5-325 MG tablet Take 1 tablet by mouth every 6 (six) hours as needed for moderate pain.   lisinopril (ZESTRIL) 10 MG tablet Take 1 tablet (10 mg total) by mouth daily.   nitroGLYCERIN (NITROSTAT) 0.4 MG SL tablet Dissolve 1 tablet under the tongue every 5 minutes as needed for chest pain. Max of 3 doses, then 911.   predniSONE (DELTASONE) 10 MG tablet Take as directed for 12 days.  Daily dose 6,6,5,5,4,4,3,3,2,2,1,1.   sildenafil (REVATIO) 20 MG tablet Take 1 tablet (20 mg total) by mouth 2 (two) times daily.     Allergies:   Patient has no known allergies.   Social History   Socioeconomic History   Marital status: Married    Spouse name: Not on file   Number of children: Not on file   Years of education: Not on file   Highest education level: Not on file  Occupational History   Not on  file  Tobacco Use   Smoking status: Every Day    Packs/day: 1.50    Types: Cigarettes   Smokeless tobacco: Never  Substance and Sexual Activity   Alcohol use: No   Drug use: No   Sexual activity: Not on file  Other Topics Concern   Not on file  Social History Narrative   Not on file   Social Determinants of Health   Financial Resource Strain: Not on file  Food Insecurity: Not on file  Transportation Needs: Not on file  Physical Activity: Not on file  Stress: Not on file  Social Connections: Not on file     Family History: The patient's family history is not on file.  ROS:   Please see the history of present illness.    All other systems reviewed and are negative.  EKGs/Labs/Other Studies Reviewed:    The following studies  were reviewed today: Stress Echo 09/19/2018: Impressions:   - Normal exercise stress echo.    Normal hemodynamic response    No arrhythmias   CT Chest 2019: IMPRESSION: 1. There is soft tissue asymmetry in the low neck in the region the right piriform sinus of uncertain significance. Either MRI of the neck or ENT assessment may be warranted. Correlate clinically. 2. No pneumonia or pleural effusion. 3. Mild centrilobular emphysema. 4. Mild thoracic aortic atherosclerosis with coronary artery calcifications present as noted above. 5. Single nonobstructing 7 mm left renal calculus.  EKG:  EKG is ordered today.  The ekg ordered today demonstrates NSR 71 bpm, within normal limits  Recent Labs: No results found for requested labs within last 365 days.  Recent Lipid Panel    Component Value Date/Time   CHOL 170 02/12/2020 1541   TRIG 102 02/12/2020 1541   HDL 48 02/12/2020 1541   CHOLHDL 3.5 02/12/2020 1541   VLDL 17 01/20/2016 0620   LDLCALC 102 (H) 02/12/2020 1541     Risk Assessment/Calculations:                Physical Exam:    VS:  BP 120/86   Pulse 71   Ht 5\' 8"  (1.727 m)   Wt 196 lb 9.6 oz (89.2 kg)   SpO2 94%   BMI 29.89 kg/m     Wt Readings from Last 3 Encounters:  07/06/22 196 lb 9.6 oz (89.2 kg)  05/16/21 191 lb 6.4 oz (86.8 kg)  01/15/21 196 lb 6.4 oz (89.1 kg)     GEN:  Well nourished, well developed in no acute distress HEENT: Normal NECK: No JVD; No carotid bruits LYMPHATICS: No lymphadenopathy CARDIAC: RRR, no murmurs, rubs, gallops RESPIRATORY:  Clear to auscultation without rales, wheezing or rhonchi  ABDOMEN: Soft, non-tender, non-distended MUSCULOSKELETAL:  No edema; No deformity  SKIN: Warm and dry NEUROLOGIC:  Alert and oriented x 3 PSYCHIATRIC:  Normal affect   ASSESSMENT:    1. Angina pectoris (HCC)   2. Mixed hyperlipidemia   3. Essential hypertension   4. Pre-procedural cardiovascular examination    PLAN:    In order  of problems listed above:  The patient has chest pain and left arm pain with some typical and atypical features.  He is at high risk of coronary artery disease with extensive smoking history, prediabetes, and hypertension.  We discussed potential diagnostic options with either noninvasive or invasive testing.  After a shared decision-making conversation, the patient will be referred for cardiac catheterization and possible angioplasty/stenting.  I think there is a high pretest probability  of obstructive CAD based on his risk profile.  His EKG today is reassuring that he is not having acute ischemia. I have reviewed the risks, indications, and alternatives to cardiac catheterization, possible angioplasty, and stenting with the patient. Risks include but are not limited to bleeding, infection, vascular injury, stroke, myocardial infection, arrhythmia, kidney injury, radiation-related injury in the case of prolonged fluoroscopy use, emergency cardiac surgery, and death. The patient understands the risks of serious complication is 1-2 in 1000 with diagnostic cardiac cath and 1-2% or less with angioplasty/stenting.  The patient will be started on aspirin 81 mg daily.  I will add a lipid panel to his pre-catheterization labs.  He will likely need to be started on a statin drug if tolerated. Add lipid panel to labs.  Goal LDL cholesterol will be less than 70 mg/dL in the context of his aortic atherosclerosis. Blood pressure controlled on amlodipine and lisinopril.  Continue same. Risk reduction measures as above with treatment of his hypertension, likely addition of a statin based on his lipid panel results, and addition of aspirin 81 mg daily.      Shared Decision Making/Informed Consent The risks [stroke (1 in 1000), death (1 in 1000), kidney failure [usually temporary] (1 in 500), bleeding (1 in 200), allergic reaction [possibly serious] (1 in 200)], benefits (diagnostic support and management of coronary  artery disease) and alternatives of a cardiac catheterization were discussed in detail with Mr. Dubuc and he is willing to proceed.    Medication Adjustments/Labs and Tests Ordered: Current medicines are reviewed at length with the patient today.  Concerns regarding medicines are outlined above.  Orders Placed This Encounter  Procedures   CBC   Basic metabolic panel   Lipid panel   EKG 12-Lead   Meds ordered this encounter  Medications   nitroGLYCERIN (NITROSTAT) 0.4 MG SL tablet    Sig: Dissolve 1 tablet under the tongue every 5 minutes as needed for chest pain. Max of 3 doses, then 911.    Dispense:  90 tablet    Refill:  3   aspirin EC 81 MG tablet    Sig: Take 1 tablet (81 mg total) by mouth daily. Swallow whole.    Dispense:  90 tablet    Refill:  3    Patient Instructions  Medication Instructions:  START Nitroglycerin as needed for chest pain START Aspirin 81mg  daily *If you need a refill on your cardiac medications before your next appointment, please call your pharmacy*   Lab Work: CBC, BMET, LIPIDS Today If you have labs (blood work) drawn today and your tests are completely normal, you will receive your results only by: MyChart Message (if you have MyChart) OR A paper copy in the mail If you have any lab test that is abnormal or we need to change your treatment, we will call you to review the results.   Testing/Procedures: L heart Catheterization Your physician has requested that you have a cardiac catheterization. Cardiac catheterization is used to diagnose and/or treat various heart conditions. Doctors may recommend this procedure for a number of different reasons. The most common reason is to evaluate chest pain. Chest pain can be a symptom of coronary artery disease (CAD), and cardiac catheterization can show whether plaque is narrowing or blocking your heart's arteries. This procedure is also used to evaluate the valves, as well as measure the blood flow and  oxygen levels in different parts of your heart. For further information please visit . Please follow instruction  sheet, as given.  Follow-Up: At [facility], you and your health needs are our priority.  As part of our continuing mission to provide you with exceptional heart care, we have created designated Provider Care Teams.  These Care Teams include your primary Cardiologist (physician) and Advanced Practice Providers (APPs -  Physician Assistants and Nurse Practitioners) who all work together to provide you with the care you need, when you need it.  We recommend signing up for the patient portal called "MyChart".  Sign up information is provided on this After Visit Summary.  MyChart is used to connect with patients for Virtual Visits (Telemedicine).  Patients are able to view lab/test results, encounter notes, upcoming appointments, etc.  Non-urgent messages can be sent to your provider as well.   To learn more about what you can do with MyChart, go to [email or web address].    Your next appointment:   1 year  The format for your next appointment:   In Person  Provider:   Charlton Haws, MD         [facility] A DEPT OF [place] A DEPT OF [facility] [address] [place], [place] [address] [id number] [facility] [place] [address] Dept: [phone] Loc: [phone]  RAYMAN PETROSIAN  07/06/2022  You are scheduled for a Cardiac Catheterization on Wednesday, August 30 with Dr. Verne Carrow.  1. Please arrive at the [facility] (Main Entrance A) at [facility]: [address] [place], [place] [address] at 8:00 AM (This time is two hours before your procedure to ensure your preparation). Free valet parking service is available.   Special note: Every effort is made to have your procedure done on time. Please understand that emergencies sometimes delay scheduled  procedures.  2. Diet: Do not eat solid foods after midnight.  The patient may have clear liquids until 5am upon the day of the procedure.  3. Labs: TODAY  4. Medication instructions in preparation for your procedure:   Contrast Allergy: No  On the morning of your procedure, take your Aspirin 81mg  and any morning medicines NOT listed above.  You may use sips of water.  5. Plan for one night stay--bring personal belongings. 6. Bring a current list of your medications and current insurance cards. 7. You MUST have a responsible person to drive you home. 8. Someone MUST be with you the first 24 hours after you arrive home or your discharge will be delayed. 9. Please wear clothes that are easy to get on and off and wear slip-on shoes.  Thank you for allowing to care for you!   -- [facility]  Important Information About Sugar        Signed, [place], MD  07/06/2022 9:27 AM    [facility]

## 2022-07-07 LAB — BASIC METABOLIC PANEL
BUN/Creatinine Ratio: 7 — ABNORMAL LOW (ref 10–24)
BUN: 8 mg/dL (ref 8–27)
CO2: 25 mmol/L (ref 20–29)
Calcium: 9.7 mg/dL (ref 8.6–10.2)
Chloride: 101 mmol/L (ref 96–106)
Creatinine, Ser: 1.23 mg/dL (ref 0.76–1.27)
Glucose: 101 mg/dL — ABNORMAL HIGH (ref 70–99)
Potassium: 4.4 mmol/L (ref 3.5–5.2)
Sodium: 139 mmol/L (ref 134–144)
eGFR: 64 mL/min/{1.73_m2} (ref >59)

## 2022-07-07 LAB — CBC
Hematocrit: 52.3 % — ABNORMAL HIGH (ref 37.5–51.0)
Hemoglobin: 17.7 g/dL (ref 13.0–17.7)
MCH: 30.6 pg (ref 26.6–33.0)
MCHC: 33.8 g/dL (ref 31.5–35.7)
MCV: 91 fL (ref 79–97)
Platelets: 282 10*3/uL (ref 150–450)
RBC: 5.78 x10E6/uL (ref 4.14–5.80)
RDW: 12.9 % (ref 11.6–15.4)
WBC: 11.7 10*3/uL — ABNORMAL HIGH (ref 3.4–10.8)

## 2022-07-07 LAB — LIPID PANEL
Chol/HDL Ratio: 3.5 ratio (ref 0.0–5.0)
Cholesterol, Total: 140 mg/dL (ref 100–199)
HDL: 40 mg/dL (ref >39)
LDL Chol Calc (NIH): 84 mg/dL (ref 0–99)
Triglycerides: 80 mg/dL (ref 0–149)
VLDL Cholesterol Cal: 16 mg/dL (ref 5–40)

## 2022-07-08 ENCOUNTER — Ambulatory Visit (HOSPITAL_COMMUNITY)
Admission: RE | Admit: 2022-07-08 | Discharge: 2022-07-08 | Disposition: A | Discharge status: Home / Self Care | Payer: Medicare Other | Attending: Cardiovascular Disease | Admitting: Cardiovascular Disease

## 2022-07-08 ENCOUNTER — Other Ambulatory Visit (HOSPITAL_COMMUNITY): Payer: Self-pay

## 2022-07-08 ENCOUNTER — Encounter (HOSPITAL_COMMUNITY)
Admission: RE | Disposition: A | Discharge status: Home / Self Care | Payer: Self-pay | Source: Home / Self Care | Attending: Cardiovascular Disease

## 2022-07-08 ENCOUNTER — Other Ambulatory Visit: Payer: Self-pay

## 2022-07-08 DIAGNOSIS — F17210 Nicotine dependence, cigarettes, uncomplicated: Secondary | ICD-10-CM | POA: Insufficient documentation

## 2022-07-08 DIAGNOSIS — I200 Unstable angina: Secondary | ICD-10-CM

## 2022-07-08 DIAGNOSIS — Z955 Presence of coronary angioplasty implant and graft: Secondary | ICD-10-CM | POA: Diagnosis not present

## 2022-07-08 DIAGNOSIS — R7303 Prediabetes: Secondary | ICD-10-CM | POA: Insufficient documentation

## 2022-07-08 DIAGNOSIS — E782 Mixed hyperlipidemia: Secondary | ICD-10-CM | POA: Diagnosis not present

## 2022-07-08 DIAGNOSIS — I10 Essential (primary) hypertension: Secondary | ICD-10-CM | POA: Insufficient documentation

## 2022-07-08 DIAGNOSIS — Z79899 Other long term (current) drug therapy: Secondary | ICD-10-CM | POA: Diagnosis not present

## 2022-07-08 DIAGNOSIS — I25110 Atherosclerotic heart disease of native coronary artery with unstable angina pectoris: Secondary | ICD-10-CM | POA: Diagnosis present

## 2022-07-08 DIAGNOSIS — I700 Atherosclerosis of aorta: Secondary | ICD-10-CM | POA: Diagnosis not present

## 2022-07-08 HISTORY — PX: LEFT HEART CATH AND CORONARY ANGIOGRAPHY: CATH118249

## 2022-07-08 HISTORY — PX: CORONARY PRESSURE/FFR STUDY: CATH118243

## 2022-07-08 HISTORY — PX: CORONARY STENT INTERVENTION: CATH118234

## 2022-07-08 HISTORY — PX: INTRAVASCULAR PRESSURE WIRE/FFR STUDY: CATH118243

## 2022-07-08 LAB — POCT ACTIVATED CLOTTING TIME: Activated Clotting Time: 317 seconds

## 2022-07-08 SURGERY — LEFT HEART CATH AND CORONARY ANGIOGRAPHY
Anesthesia: LOCAL

## 2022-07-08 MED ORDER — HEPARIN SODIUM (PORCINE) 1000 UNIT/ML IJ SOLN
INTRAMUSCULAR | Status: DC | PRN
  Administered 2022-07-08: 4500 [IU] via INTRAVENOUS
  Administered 2022-07-08: 5500 [IU] via INTRAVENOUS

## 2022-07-08 MED ORDER — CLOPIDOGREL BISULFATE 300 MG PO TABS
ORAL_TABLET | ORAL | Status: AC
  Filled 2022-07-08: qty 2

## 2022-07-08 MED ORDER — CLOPIDOGREL BISULFATE 300 MG PO TABS
ORAL_TABLET | ORAL | Status: DC | PRN
  Administered 2022-07-08: 600 mg via ORAL

## 2022-07-08 MED ORDER — FENTANYL CITRATE (PF) 100 MCG/2ML IJ SOLN
INTRAMUSCULAR | Status: DC | PRN
  Administered 2022-07-08: 50 ug via INTRAVENOUS

## 2022-07-08 MED ORDER — FENTANYL CITRATE (PF) 100 MCG/2ML IJ SOLN
INTRAMUSCULAR | Status: AC
  Filled 2022-07-08: qty 2

## 2022-07-08 MED ORDER — LABETALOL HCL 5 MG/ML IV SOLN: 10.0000 mg | INTRAVENOUS | Status: DC | PRN

## 2022-07-08 MED ORDER — HYDRALAZINE HCL 20 MG/ML IJ SOLN: 10.0000 mg | INTRAMUSCULAR | Status: DC | PRN

## 2022-07-08 MED ORDER — SODIUM CHLORIDE 0.9% FLUSH
3.0000 mL | INTRAVENOUS | Status: DC | PRN
Start: 2022-07-08 — End: 2022-07-08

## 2022-07-08 MED ORDER — HEPARIN SODIUM (PORCINE) 1000 UNIT/ML IJ SOLN
INTRAMUSCULAR | Status: AC
  Filled 2022-07-08: qty 10

## 2022-07-08 MED ORDER — NITROGLYCERIN 1 MG/10 ML FOR IR/CATH LAB
INTRA_ARTERIAL | Status: AC
  Filled 2022-07-08: qty 10

## 2022-07-08 MED ORDER — ASPIRIN 81 MG PO CHEW: 81.0000 mg | CHEWABLE_TABLET | ORAL | Status: DC

## 2022-07-08 MED ORDER — SODIUM CHLORIDE 0.9% FLUSH: 3.0000 mL | Freq: Two times a day (BID) | INTRAVENOUS | Status: DC

## 2022-07-08 MED ORDER — FAMOTIDINE IN NACL 20-0.9 MG/50ML-% IV SOLN
INTRAVENOUS | Status: DC | PRN
  Administered 2022-07-08: 20 mg via INTRAVENOUS

## 2022-07-08 MED ORDER — SODIUM CHLORIDE 0.9 % IV SOLN: INTRAVENOUS | Status: DC

## 2022-07-08 MED ORDER — FAMOTIDINE IN NACL 20-0.9 MG/50ML-% IV SOLN
INTRAVENOUS | Status: AC
Start: 2022-07-08 — End: ?
  Filled 2022-07-08: qty 50

## 2022-07-08 MED ORDER — MIDAZOLAM HCL 2 MG/2ML IJ SOLN
INTRAMUSCULAR | Status: AC
  Filled 2022-07-08: qty 2

## 2022-07-08 MED ORDER — SODIUM CHLORIDE 0.9% FLUSH: 3.0000 mL | INTRAVENOUS | Status: DC | PRN

## 2022-07-08 MED ORDER — MIDAZOLAM HCL 2 MG/2ML IJ SOLN
INTRAMUSCULAR | Status: DC | PRN
  Administered 2022-07-08: 2 mg via INTRAVENOUS

## 2022-07-08 MED ORDER — LIDOCAINE HCL (PF) 1 % IJ SOLN
INTRAMUSCULAR | Status: AC
  Filled 2022-07-08: qty 30

## 2022-07-08 MED ORDER — SODIUM CHLORIDE 0.9 % IV SOLN: 250.0000 mL | INTRAVENOUS | Status: DC | PRN

## 2022-07-08 MED ORDER — ACETAMINOPHEN 325 MG PO TABS: 650.0000 mg | ORAL_TABLET | ORAL | Status: DC | PRN

## 2022-07-08 MED ORDER — ROSUVASTATIN CALCIUM 40 MG PO TABS
40.0000 mg | ORAL_TABLET | Freq: Every day | ORAL | 1 refills | Status: AC
  Filled 2022-07-08: qty 90, 90d supply, fill #0

## 2022-07-08 MED ORDER — SODIUM CHLORIDE 0.9 % WEIGHT BASED INFUSION: 1.0000 mL/kg/h | INTRAVENOUS | Status: DC

## 2022-07-08 MED ORDER — VERAPAMIL HCL 2.5 MG/ML IV SOLN
INTRAVENOUS | Status: DC | PRN
  Administered 2022-07-08: 10 mL via INTRA_ARTERIAL

## 2022-07-08 MED ORDER — HEPARIN (PORCINE) IN NACL 1000-0.9 UT/500ML-% IV SOLN
INTRAVENOUS | Status: AC
  Filled 2022-07-08: qty 500

## 2022-07-08 MED ORDER — CLOPIDOGREL BISULFATE 75 MG PO TABS
75.0000 mg | ORAL_TABLET | Freq: Every day | ORAL | 2 refills | Status: DC
  Filled 2022-07-08: qty 90, 90d supply, fill #0

## 2022-07-08 MED ORDER — VERAPAMIL HCL 2.5 MG/ML IV SOLN
INTRAVENOUS | Status: AC
Start: 2022-07-08 — End: ?
  Filled 2022-07-08: qty 2

## 2022-07-08 MED ORDER — SODIUM CHLORIDE 0.9 % WEIGHT BASED INFUSION
3.0000 mL/kg/h | INTRAVENOUS | Status: AC
  Administered 2022-07-08: 3 mL/kg/h via INTRAVENOUS

## 2022-07-08 MED ORDER — IOHEXOL 350 MG/ML SOLN
INTRAVENOUS | Status: DC | PRN
  Administered 2022-07-08: 125 mL

## 2022-07-08 MED ORDER — HEPARIN (PORCINE) IN NACL 1000-0.9 UT/500ML-% IV SOLN
INTRAVENOUS | Status: DC | PRN
  Administered 2022-07-08 (×2): 500 mL

## 2022-07-08 MED ORDER — CLOPIDOGREL BISULFATE 75 MG PO TABS: 75.0000 mg | ORAL_TABLET | Freq: Every day | ORAL | Status: DC

## 2022-07-08 MED ORDER — LIDOCAINE HCL (PF) 1 % IJ SOLN
INTRAMUSCULAR | Status: DC | PRN
  Administered 2022-07-08: 5 mL

## 2022-07-08 MED ORDER — HEPARIN (PORCINE) IN NACL 1000-0.9 UT/500ML-% IV SOLN
INTRAVENOUS | Status: AC
Start: 2022-07-08 — End: ?
  Filled 2022-07-08: qty 500

## 2022-07-08 MED ORDER — ONDANSETRON HCL 4 MG/2ML IJ SOLN: 4.0000 mg | Freq: Four times a day (QID) | INTRAMUSCULAR | Status: DC | PRN

## 2022-07-08 SURGICAL SUPPLY — 20 items
BALL SAPPHIRE NC24 3.0X22 (BALLOONS) ×1
BALLN SAPPHIRE 2.0X15 (BALLOONS) ×1
BALLOON SAPPHIRE 2.0X15 (BALLOONS) IMPLANT
BALLOON SAPPHIRE NC24 3.0X22 (BALLOONS) IMPLANT
BAND CMPR LRG ZPHR (HEMOSTASIS) ×1
BAND ZEPHYR COMPRESS 30 LONG (HEMOSTASIS) IMPLANT
CATH 5FR JL3.5 JR4 ANG PIG MP (CATHETERS) IMPLANT
CATH VISTA GUIDE 6FR XBLAD3.5 (CATHETERS) IMPLANT
GLIDESHEATH SLEND SS 6F .021 (SHEATH) IMPLANT
GUIDEWIRE INQWIRE 1.5J.035X260 (WIRE) IMPLANT
GUIDEWIRE PRESSURE X 175 (WIRE) IMPLANT
INQWIRE 1.5J .035X260CM (WIRE) ×1
KIT ENCORE 26 ADVANTAGE (KITS) IMPLANT
KIT ESSENTIALS PG (KITS) IMPLANT
KIT HEART LEFT (KITS) ×2 IMPLANT
PACK CARDIAC CATHETERIZATION (CUSTOM PROCEDURE TRAY) ×2 IMPLANT
STENT SYNERGY XD 2.75X28 (Permanent Stent) IMPLANT
SYNERGY XD 2.75X28 (Permanent Stent) ×1 IMPLANT
TRANSDUCER W/STOPCOCK (MISCELLANEOUS) ×2 IMPLANT
TUBING CIL FLEX 10 FLL-RA (TUBING) ×2 IMPLANT

## 2022-07-08 NOTE — Discharge Summary (Signed)
Discharge Summary for Same Day PCI   Patient ID: Daniel Hurley MRN: [id number]; DOB: 09-29-1956  Admit date: 07/08/2022 Discharge date: 07/08/2022  Primary Care Provider: Eunice Blase, MD  Primary Cardiologist: Jenkins Rouge, MD  Primary Electrophysiologist:  None   Discharge Diagnoses    Active Problems:   Unstable angina [facility])  Diagnostic Studies/Procedures    Cardiac Catheterization 07/08/2022:    Prox RCA lesion is 20% stenosed.   Ost Cx to Prox Cx lesion is 40% stenosed.   Ramus lesion is 40% stenosed.   Prox LAD to Mid LAD lesion is 70% stenosed.   A drug-eluting stent was successfully placed using a SYNERGY XD 2.75X28.   Post intervention, there is a 0% residual stenosis.   Severe mid LAD stenosis. RFR 0.84.  Mild non-obstructive disease in the ostial Circumflex and intermediate branch.  Mild non-obstructive disease in the mid RCA Successful PTCA/DES x 1 mid LAD   Recommendations: Continue DAPT with ASA and Plavix for at least six months. Continue beta blocker and statin.   Diagnostic Dominance: Right  Intervention   _____________   History of Present Illness     Daniel Hurley is a 66 y.o. male with past medical history of hypertension.  He was seen back in November 2019 for evaluation of exertional dyspnea and chest pain.  Had a normal stress echocardiogram with no evidence of wall motion abnormality.  Presented back to the office on 8/28 with complaints of chest pain and the sensation of a "brick on his chest".  Reported chronic shortness of breath that had worsened over the past several weeks prior to his visit.  Given his symptoms he was set up for outpatient cardiac catheterization.  Hospital Course     The patient underwent cardiac cath as noted above with severe mid LAD stenosis of 70%, RFR of 0.86 with PCI/DES x1. Mild nonobstructive disease in the circumflex and intermediate branch, mid RCA.Marland Kitchen Plan for DAPT with ASA/plavix for at least 6 months.  The patient was seen by cardiac rehab while in short stay. There were no observed complications post cath. Radial cath site was re-evaluated prior to discharge and found to be stable without any complications. Instructions/precautions regarding cath site care were given prior to discharge.  [name] was seen by Dr. Angelena Form and determined stable for discharge home. Follow up with our office has been arranged. Medications are listed below. Pertinent changes include addition of plavix and crestor 15m daily. _____________  Cath/PCI Registry Performance & Quality Measures: Aspirin prescribed? - Yes ADP Receptor Inhibitor (Plavix/Clopidogrel, Brilinta/Ticagrelor or Effient/Prasugrel) prescribed (includes medically managed patients)? - Yes High Intensity Statin (Lipitor 40-826mor Crestor 20-4050mprescribed? - Yes For EF <40%, was ACEI/ARB prescribed? - Not Applicable (EF >/= 40%17%or EF <40%, Aldosterone Antagonist (Spironolactone or Eplerenone) prescribed? - Not Applicable (EF >/= 40%61%ardiac Rehab Phase II ordered (Included Medically managed Patients)? - Yes _____________   Discharge Vitals Blood pressure (!) 152/95, pulse 74, resp. rate (!) 25, SpO2 95 %.  There were no vitals filed for this visit.  Last Labs & Radiologic Studies    CBC Recent Labs    07/06/22 0930  WBC 11.7*  HGB 17.7  HCT 52.3*  MCV 91  PLT [id number] Metabolic Panel Recent Labs    07/06/22 0930  NA 139  K 4.4  CL 101  CO2 25  GLUCOSE 101*  BUN 8  CREATININE 1.23  CALCIUM 9.7   Liver Function Tests  No results for input(s): "AST", "ALT", "ALKPHOS", "BILITOT", "PROT", "ALBUMIN" in the last 72 hours. No results for input(s): "LIPASE", "AMYLASE" in the last 72 hours. High Sensitivity Troponin:   No results for input(s): "TROPONINIHS" in the last 720 hours.  BNP Invalid input(s): "POCBNP" D-Dimer No results for input(s): "DDIMER" in the last 72 hours. Hemoglobin A1C No results for input(s):  "HGBA1C" in the last 72 hours. Fasting Lipid Panel Recent Labs    07/06/22 0930  CHOL 140  HDL 40  LDLCALC 84  TRIG 80  CHOLHDL 3.5   Thyroid Function Tests No results for input(s): "TSH", "T4TOTAL", "T3FREE", "THYROIDAB" in the last 72 hours.  Invalid input(s): "FREET3" _____________  CARDIAC CATHETERIZATION  Result Date: 07/08/2022   Prox RCA lesion is 20% stenosed.   Ost Cx to Prox Cx lesion is 40% stenosed.   Ramus lesion is 40% stenosed.   Prox LAD to Mid LAD lesion is 70% stenosed.   A drug-eluting stent was successfully placed using a SYNERGY XD 2.75X28.   Post intervention, there is a 0% residual stenosis. Severe mid LAD stenosis. RFR 0.84. Mild non-obstructive disease in the ostial Circumflex and intermediate branch. Mild non-obstructive disease in the mid RCA Successful PTCA/DES x 1 mid LAD Recommendations: Continue DAPT with ASA and Plavix for at least six months. Continue beta blocker and statin.    Disposition   Pt is being discharged home today in good condition.  Follow-up Plans & Appointments     Follow-up Information     Imogene Burn, PA-C Follow up on 07/22/2022.   Specialty: Cardiology Why: at 11:15am for your follow up appt Contact information: [address] [place] [address] [phone]                Discharge Instructions     Amb Referral to Cardiac Rehabilitation   Complete by: As directed    Diagnosis: Coronary Stents   After initial evaluation and assessments completed: Virtual Based Care may be provided alone or in conjunction with Phase 2 Cardiac Rehab based on patient barriers.: Yes   Intensive Cardiac Rehabilitation (ICR) [place] location only OR Traditional Cardiac Rehabilitation (TCR) If criteria for ICR are not met will enroll in TCR [facility] only): Yes        Discharge Medications   Allergies as of 07/08/2022   No Known Allergies      Medication List     TAKE these medications    albuterol 108 (90  Base) MCG/ACT inhaler Commonly known as: VENTOLIN HFA Inhale 2 puffs into the lungs every 6 (six) hours as needed for wheezing or shortness of breath.   amLODipine 5 MG tablet Commonly known as: NORVASC Take 1 tablet (5 mg total) by mouth daily.   aspirin EC 81 MG tablet Take 1 tablet (81 mg total) by mouth daily. Swallow whole.   chlorzoxazone 500 MG tablet Commonly known as: Parafon Forte DSC Take 1 tablet (500 mg total) by mouth 4 (four) times daily as needed for muscle spasms.   clonazePAM 0.5 MG tablet Commonly known as: KlonoPIN Take 0.5-1 tablets (0.25-0.5 mg total) by mouth 2 (two) times daily as needed for anxiety.   clopidogrel 75 MG tablet Commonly known as: PLAVIX Take 1 tablet (75 mg total) by mouth daily with breakfast. Start taking on: July 09, 2022   fluticasone-salmeterol 500-50 MCG/ACT Aepb Commonly known as: Advair Diskus Inhale 1 puff into the lungs in the morning and at bedtime.   HYDROcodone-acetaminophen 7.5-325  MG tablet Commonly known as: NORCO Take 1 tablet by mouth every 6 (six) hours as needed for moderate pain.   lisinopril 10 MG tablet Commonly known as: ZESTRIL Take 1 tablet (10 mg total) by mouth daily.   nitroGLYCERIN 0.4 MG SL tablet Commonly known as: NITROSTAT Dissolve 1 tablet under the tongue every 5 minutes as needed for chest pain. Max of 3 doses, then 911.   rosuvastatin 40 MG tablet Commonly known as: Crestor Take 1 tablet (40 mg total) by mouth daily.   sildenafil 20 MG tablet Commonly known as: REVATIO Take 1 tablet (20 mg total) by mouth 2 (two) times daily. What changed:  when to take this reasons to take this   testosterone cypionate 200 MG/ML injection Commonly known as: DEPOTESTOSTERONE CYPIONATE Inject 100 mg into the skin every 7 (seven) days.        Allergies No Known Allergies  Outstanding Labs/Studies   FLP/LFTS in 8 weeks   Duration of Discharge Encounter   Greater than 30 minutes including  physician time.  Signed, Reino Bellis, NP 07/08/2022, 2:27 PM

## 2022-07-08 NOTE — Interval H&P Note (Signed)
History and Physical Interval Note:  07/08/2022 8:15 AM  Daniel Hurley  has presented today for surgery, with the diagnosis of angina.  The various methods of treatment have been discussed with the patient and family. After consideration of risks, benefits and other options for treatment, the patient has consented to  Procedure(s): LEFT HEART CATH AND CORONARY ANGIOGRAPHY (N/A) as a surgical intervention.  The patient's history has been reviewed, patient examined, no change in status, stable for surgery.  I have reviewed the patient's chart and labs.  Questions were answered to the patient's satisfaction.   Cath Lab Visit (complete for each Cath Lab visit)  Clinical Evaluation Leading to the Procedure:   ACS: No.  Non-ACS:    Anginal Classification: CCS III  Anti-ischemic medical therapy: Minimal Therapy (1 class of medications)  Non-Invasive Test Results: No non-invasive testing performed  Prior CABG: No previous CABG        Verne Carrow

## 2022-07-08 NOTE — Progress Notes (Signed)
CARDIAC REHAB PHASE I   Stent education completed with pt. Pt educated on importance of ASA and Plavix. Pt given stent card along with heart healthy diet and smoking cessation tip sheet. Reviewed site care, restrictions, and exercise guidelines. Will refer to CRP II [place].  [phone] Reynold Bowen, RN BSN 07/08/2022 1:07 PM

## 2022-07-09 ENCOUNTER — Telehealth: Payer: Self-pay | Admitting: Cardiovascular Disease

## 2022-07-09 ENCOUNTER — Encounter (HOSPITAL_COMMUNITY): Payer: Self-pay | Admitting: Cardiovascular Disease

## 2022-07-09 NOTE — Telephone Encounter (Signed)
  Pt is calling back. He wanted to let the nurse know that he will not go to the ED until he hears back from Dr. Excell Seltzer. He said, if it did get so bad he will call EMS

## 2022-07-09 NOTE — Telephone Encounter (Signed)
Pt c/o of Chest Pain: STAT if CP now or developed within 24 hours  1. Are you having CP right now? Yes on the right side   2. Are you experiencing any other symptoms (ex. SOB, nausea, vomiting, sweating)?   3. How long have you been experiencing CP?  All day   4. Is your CP continuous or coming and going? Everytime he breaths   5. Have you taken Nitroglycerin? No  Pt had a stent put in, he is having some chest pain, wants to know if this is normal.  ?

## 2022-07-09 NOTE — Telephone Encounter (Signed)
Call came straight to triage. Patient complaining of chest pain on the right side every time he takes a breath. Patient stated other than that he feels fine. Advised patient to go to the ED. Patient is not wanting to go to the ED. Informed patient that Dr. Excell Seltzer would be informed of this, but he should still go to the ED to get checked out. Patient verbalized understanding.

## 2022-07-14 NOTE — Telephone Encounter (Signed)
With right-sided, pleuritic pain, it's unlikely that this is heart or stent-related pain. However, difficult to assess without seeing him. He has an appt in one week with Herma Carson. If symptoms worsen or change in character, he should call EMS/go to ER. If no change and he is otherwise feeling ok, he can wait until his office visit. thx

## 2022-07-15 NOTE — Telephone Encounter (Signed)
Patient returned call to office to review Dr Earmon Phoenix comments. He states he feels perfectly fine now, no complaints. Expressed that he didn't see a reason to keep upcoming appt with Marcelino Duster, but says that he will since it's standard procedure after stent.

## 2022-07-15 NOTE — Telephone Encounter (Signed)
Attempted both numbers on file, left messages on both.

## 2022-07-17 NOTE — Progress Notes (Deleted)
Cardiology Office Note:    Date:  07/17/2022   ID:  Daniel Hurley, DOB Oct 26, 1956, MRN [id number]  PCP:  Lavada Mesi, MD  [facility] Providers Cardiologist:  Charlton Haws, MD { Click to update primary MD,subspecialty MD or APP then REFRESH:1}    Referring MD: Lavada Mesi, MD   Chief Complaint:  No chief complaint on file. {Click here for Visit Info    :1}    History of Present Illness:   Daniel Hurley is a 66 y.o. male with history of HLD, COPD with recent chest pain. He underwent  cath anf found to have CAD DES mLAD 07/08/22 . Mild nonobstructive disease in the circumflex and intermediate branch, mid RCA.Marland Kitchen Plan for DAPT with ASA/plavix for at least 6 months.       Past Medical History:  Diagnosis Date   COPD (chronic obstructive pulmonary disease) (HCC)    Hypertension 02/12/2020   Current Medications: No outpatient medications have been marked as taking for the 07/22/22 encounter (Appointment) with Dyann Kief, PA-C.    Allergies:   Patient has no known allergies.   Social History   Tobacco Use   Smoking status: Every Day    Packs/day: 1.50    Types: Cigarettes   Smokeless tobacco: Never  Substance Use Topics   Alcohol use: No   Drug use: No    Family Hx: The patient's family history is not on file.  ROS   EKGs/Labs/Other Test Reviewed:    EKG:  EKG is *** ordered today.  The ekg ordered today demonstrates ***  Recent Labs: 07/06/2022: BUN 8; Creatinine, Ser 1.23; Hemoglobin 17.7; Platelets 282; Potassium 4.4; Sodium 139   Recent Lipid Panel Recent Labs    07/06/22 0930  CHOL 140  TRIG 80  HDL 40  LDLCALC 84     Prior CV Studies: {Select studies to display:26339}  Cardiac Catheterization 07/08/2022:     Prox RCA lesion is 20% stenosed.   Ost Cx to Prox Cx lesion is 40% stenosed.   Ramus lesion is 40% stenosed.   Prox LAD to Mid LAD lesion is 70% stenosed.   A drug-eluting stent was successfully placed using a SYNERGY XD  2.75X28.   Post intervention, there is a 0% residual stenosis.   Severe mid LAD stenosis. RFR 0.84.  Mild non-obstructive disease in the ostial Circumflex and intermediate branch.  Mild non-obstructive disease in the mid RCA Successful PTCA/DES x 1 mid LAD   Recommendations: Continue DAPT with ASA and Plavix for at least six months. Continue beta blocker and statin.    Diagnostic Dominance: Right  Intervention    _____________    Risk Assessment/Calculations/Metrics:   {Does this patient have ATRIAL FIBRILLATION?:[phone]}     No BP recorded.  {Refresh Note OR Click here to enter BP  :1}***    Physical Exam:    VS:  There were no vitals taken for this visit.    Wt Readings from Last 3 Encounters:  07/06/22 196 lb 9.6 oz (89.2 kg)  05/16/21 191 lb 6.4 oz (86.8 kg)  01/15/21 196 lb 6.4 oz (89.1 kg)    Physical Exam  GEN: Well nourished, well developed, in no acute distress  HEENT: normal  Neck: no JVD, carotid bruits, or masses Cardiac:RRR; no murmurs, rubs, or gallops  Respiratory:  clear to auscultation bilaterally, normal work of breathing GI: soft, nontender, nondistended, + BS Ext: without cyanosis, clubbing, or edema, Good distal pulses bilaterally MS: no deformity or  atrophy  Skin: warm and dry, no rash Neuro:  Alert and Oriented x 3, Strength and sensation are intact Psych: euthymic mood, full affect       ASSESSMENT & PLAN:   No problem-specific Assessment & Plan notes found for this encounter.   CAD DES mLAD 07/08/22 . Mild nonobstructive disease in the circumflex and intermediate branch, mid RCA.Marland Kitchen Plan for DAPT with ASA/plavix for at least 6 months.   HTN  COPD   {The patient has an active order for outpatient cardiac rehabilitation.   Please indicate if the patient is ready to start. Do NOT delete this.  It will auto delete.  Refresh note, then sign.              Click here to document readiness and see contraindications.  :1}  Cardiac  Rehabilitation Eligibility Assessment        {Are you ordering a CV Procedure (e.g. stress test, cath, DCCV, TEE, etc)?   Press F2        :[id number]}   Dispo:  No follow-ups on file.   Medication Adjustments/Labs and Tests Ordered: Current medicines are reviewed at length with the patient today.  Concerns regarding medicines are outlined above.  Tests Ordered: No orders of the defined types were placed in this encounter.  Medication Changes: No orders of the defined types were placed in this encounter.  Signed, Jacolyn Reedy, PA-C  07/17/2022 7:50 AM    [facility] [address] [place], [place], [place]  [address] Phone: [phone]; Fax: [phone]

## 2022-07-22 ENCOUNTER — Ambulatory Visit: Payer: Medicare Other | Admitting: Physician Assistant

## 2022-09-01 ENCOUNTER — Ambulatory Visit: Payer: Medicare Other | Admitting: Cardiovascular Disease

## 2022-12-16 ENCOUNTER — Telehealth: Payer: Self-pay | Admitting: *Deleted

## 2022-12-16 NOTE — Telephone Encounter (Signed)
Received message from PCP that patient reported an episode of chest tightness, SOB and radiating pain down his left arm lasting Sat/Sun.  He is better today but requesting appointment.  Office was to fax EKG.  No received as of this time.   Called the patient and scheduled him with Dr. Johnsie Cancel on Friday.  Pt appreciative for the appointment/assistance.

## 2022-12-17 NOTE — Progress Notes (Signed)
Cardiology Office Note:    Date:  12/18/2022   ID:  Daniel Hurley, DOB [birth year], MRN ID:[id number]  PCP:  Eunice Blase, MD   [place] Providers Cardiologist:  Jenkins Rouge, MD     Referring MD: Eunice Blase, MD   No chief complaint on file.   History of Present Illness:    Daniel Hurley is a 68 y.o. male I have not seen since November 2019 Last seen by Dr Burt Knack 07/06/22 .  He had a normal stress echocardiogram 09/19/18 with no evidence of wall motion abnormality or pulmonary hypertension. Cath 07/08/22 resulted in stent to proximal LAD with mild non obstructive dx in ostial RCA/LCX and IM branches Still smoking a ppd No recent chest CT or CXR in Epic CRFls also include HTN PCP called 12/16/22 indicating patient was having chest tightness with dyspnea and radiation down left arm this past weekend Better the remainder of this week   Pain is atypical muscular left arm Has not returned this week Under a lot of stress looking after his 91 yo mom "who is crazy"  Two boys Thedore Mins and Will Former played baseball with my son when younger.   Shared decision making favor stress testing to evaluate rather than repeat caht  Discussed smoking cessation and need for f/u Lung cancer CT    Past Medical History:  Diagnosis Date   COPD (chronic obstructive pulmonary disease) ([place])    Hypertension 02/12/2020    Past Surgical History:  Procedure Laterality Date   ANKLE FUSION  2006   BACK SURGERY  2008   L5-5 fusion    CORONARY STENT INTERVENTION N/A 07/08/2022   Procedure: CORONARY STENT INTERVENTION;  Surgeon: Burnell Blanks, MD;  Location: [place] [facility];  Service: Cardiovascular;  Laterality: N/A;   INTRAVASCULAR PRESSURE WIRE/FFR STUDY N/A 07/08/2022   Procedure: INTRAVASCULAR PRESSURE WIRE/FFR STUDY;  Surgeon: Burnell Blanks, MD;  Location: [place] [facility];  Service: Cardiovascular;  Laterality: N/A;   LEFT HEART CATH AND CORONARY ANGIOGRAPHY N/A  07/08/2022   Procedure: LEFT HEART CATH AND CORONARY ANGIOGRAPHY;  Surgeon: Burnell Blanks, MD;  Location: [place] [facility];  Service: Cardiovascular;  Laterality: N/A;   left wrist surgery      Current Medications: Current Meds  Medication Sig   albuterol (VENTOLIN HFA) 108 (90 Base) MCG/ACT inhaler Inhale 2 puffs into the lungs every 6 (six) hours as needed for wheezing or shortness of breath.   amLODipine (NORVASC) 5 MG tablet Take 1 tablet (5 mg total) by mouth daily.   aspirin EC 81 MG tablet Take 1 tablet (81 mg total) by mouth daily. Swallow whole.   chlorzoxazone (PARAFON FORTE DSC) 500 MG tablet Take 1 tablet (500 mg total) by mouth 4 (four) times daily as needed for muscle spasms.   clopidogrel (PLAVIX) 75 MG tablet Take 1 tablet (75 mg total) by mouth daily with breakfast.   fluticasone-salmeterol (ADVAIR DISKUS) 500-50 MCG/ACT AEPB Inhale 1 puff into the lungs in the morning and at bedtime.   HYDROcodone-acetaminophen (NORCO) 7.5-325 MG tablet Take 1 tablet by mouth every 6 (six) hours as needed for moderate pain.   lisinopril (ZESTRIL) 10 MG tablet Take 1 tablet (10 mg total) by mouth daily.   nitroGLYCERIN (NITROSTAT) 0.4 MG SL tablet Dissolve 1 tablet under the tongue every 5 minutes as needed for chest pain. Max of 3 doses, then 911.   rosuvastatin (CRESTOR) 40 MG tablet Take 1 tablet (40 mg total)  by mouth daily.   sildenafil (REVATIO) 20 MG tablet Take 1 tablet (20 mg total) by mouth 2 (two) times daily. (Patient taking differently: Take 20 mg by mouth daily as needed (ED).)   testosterone cypionate (DEPOTESTOSTERONE CYPIONATE) 200 MG/ML injection Inject 100 mg into the skin every 7 (seven) days.     Allergies:   Patient has no known allergies.   Social History   Socioeconomic History   Marital status: Married    Spouse name: Not on file   Number of children: Not on file   Years of education: Not on file   Highest education level: Not on file  Occupational  History   Not on file  Tobacco Use   Smoking status: Every Day    Packs/day: 1.50    Types: Cigarettes   Smokeless tobacco: Never  Substance and Sexual Activity   Alcohol use: No   Drug use: No   Sexual activity: Not on file  Other Topics Concern   Not on file  Social History Narrative   Not on file   Social Determinants of Health   Financial Resource Strain: Not on file  Food Insecurity: Not on file  Transportation Needs: Not on file  Physical Activity: Not on file  Stress: Not on file  Social Connections: Not on file     Family History: The patient's family history is not on file.  ROS:   Please see the history of present illness.    All other systems reviewed and are negative.  EKGs/Labs/Other Studies Reviewed:    Cath 07/08/22 Conclusion      Prox RCA lesion is 20% stenosed.   Ost Cx to Prox Cx lesion is 40% stenosed.   Ramus lesion is 40% stenosed.   Prox LAD to Mid LAD lesion is 70% stenosed.   A drug-eluting stent was successfully placed using a SYNERGY XD 2.75X28.   Post intervention, there is a 0% residual stenosis.   Severe mid LAD stenosis. RFR 0.84.  Mild non-obstructive disease in the ostial Circumflex and intermediate branch.  Mild non-obstructive disease in the mid RCA Successful PTCA/DES x 1 mid LAD   Recommendations: Continue DAPT with ASA and Plavix for at least six months. Continue beta blocker and statin.     The following studies were reviewed today: Stress Echo 09/19/2018: Impressions:   - Normal exercise stress echo.    Normal hemodynamic response    No arrhythmias   CT Chest 2019: IMPRESSION: 1. There is soft tissue asymmetry in the low neck in the region the right piriform sinus of uncertain significance. Either MRI of the neck or ENT assessment may be warranted. Correlate clinically. 2. No pneumonia or pleural effusion. 3. Mild centrilobular emphysema. 4. Mild thoracic aortic atherosclerosis with coronary  artery calcifications present as noted above. 5. Single nonobstructing 7 mm left renal calculus.  EKG: 12/18/2022 NSR rate 83 normal ECG   Recent Labs: 07/06/2022: BUN 8; Creatinine, Ser 1.23; Hemoglobin 17.7; Platelets 282; Potassium 4.4; Sodium 139  Recent Lipid Panel    Component Value Date/Time   CHOL 140 07/06/2022 0930   TRIG 80 07/06/2022 0930   HDL 40 07/06/2022 0930   CHOLHDL 3.5 07/06/2022 0930   CHOLHDL 3.5 02/12/2020 1541   VLDL 17 01/20/2016 0620   LDLCALC 84 07/06/2022 0930   LDLCALC 102 (H) 02/12/2020 1541     Physical Exam:    VS:  BP 106/70   Pulse 83   Ht 5' 8"$  (1.727 m)  Wt 185 lb (83.9 kg)   SpO2 97%   BMI 28.13 kg/m     Wt Readings from Last 3 Encounters:  12/18/22 185 lb (83.9 kg)  07/06/22 196 lb 9.6 oz (89.2 kg)  05/16/21 191 lb 6.4 oz (86.8 kg)    Affect appropriate COPD HEENT: normal Neck supple with no adenopathy JVP normal no bruits no thyromegaly Lungs mild exp wheezing  Heart:  S1/S2 no murmur, no rub, gallop or click PMI normal Abdomen: benighn, BS positve, no tenderness, no AAA no bruit.  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 and ongoing smoking Shared decision making lexiscan myovue ordered Should have high sensitivity to pick up proximal LAD stent restenosis  HTN:  continue amlodipine/ACE Smoking: counseled on cessation < 10 minutes lung cancer CT scan ordered  HLD:  on crestor last LDL 84 will update       Shared Decision Making/Informed Consent The risks [stroke (1 in 1000), death (1 in 1000), kidney failure [usually temporary] (1 in 500), bleeding (1 in 200), allergic reaction [possibly serious] (1 in 200)], benefits (diagnostic support and management of coronary artery disease) and alternatives of a cardiac  catheterization were discussed in detail with Daniel Hurley and he is willing to proceed.    Lung cancer CT Lexiscan myovue  F/U 6 months   Signed, Jenkins Rouge, MD  12/18/2022 11:10 AM    [place]

## 2022-12-18 ENCOUNTER — Ambulatory Visit: Payer: Medicare Other | Attending: Cardiovascular Disease | Admitting: Cardiovascular Disease

## 2022-12-18 ENCOUNTER — Telehealth (HOSPITAL_COMMUNITY): Payer: Self-pay | Admitting: *Deleted

## 2022-12-18 ENCOUNTER — Encounter (HOSPITAL_COMMUNITY): Payer: Self-pay | Admitting: *Deleted

## 2022-12-18 ENCOUNTER — Encounter: Payer: Self-pay | Admitting: Cardiovascular Disease

## 2022-12-18 VITALS — BP 106/70 | HR 83 | Ht 68.0 in | Wt 185.0 lb

## 2022-12-18 DIAGNOSIS — I209 Angina pectoris, unspecified: Secondary | ICD-10-CM

## 2022-12-18 DIAGNOSIS — E782 Mixed hyperlipidemia: Secondary | ICD-10-CM

## 2022-12-18 DIAGNOSIS — I10 Essential (primary) hypertension: Secondary | ICD-10-CM | POA: Diagnosis not present

## 2022-12-18 DIAGNOSIS — R072 Precordial pain: Secondary | ICD-10-CM

## 2022-12-18 MED ORDER — NITROGLYCERIN 0.4 MG SL SUBL: SUBLINGUAL_TABLET | SUBLINGUAL | 3 refills | Status: AC

## 2022-12-18 NOTE — Telephone Encounter (Signed)
My Chart letter sent outlining instructions for upcoming stress test on 12/23/22.

## 2022-12-18 NOTE — Patient Instructions (Signed)
Medication Instructions:  Your physician recommends that you continue on your current medications as directed. Please refer to the Current Medication list given to you today.  *If you need a refill on your cardiac medications before your next appointment, please call your pharmacy*  Lab Work: If you have labs (blood work) drawn today and your tests are completely normal, you will receive your results only by: [place] (if you have MyChart) OR A paper copy in the mail If you have any lab test that is abnormal or we need to change your treatment, we will call you to review the results.  Testing/Procedures: Non-Cardiac CT scanning for lung cancer screening (CAT scanning), is a noninvasive, special x-ray that produces cross-sectional images of the body using x-rays and a computer. CT scans help physicians diagnose and treat medical conditions. For some CT exams, a contrast material is used to enhance visibility in the area of the body being studied. CT scans provide greater clarity and reveal more details than regular x-ray exams.  Your physician has requested that you have a lexiscan myoview. For further information please visit [email or web address]. Please follow instruction sheet, as given.  Follow-Up: At [facility], you and your health needs are our priority.  As part of our continuing mission to provide you with exceptional heart care, we have created designated Provider Care Teams.  These Care Teams include your primary Cardiologist (physician) and Advanced Practice Providers (APPs -  Physician Assistants and Nurse Practitioners) who all work together to provide you with the care you need, when you need it.  We recommend signing up for the patient portal called "MyChart".  Sign up information is provided on this After Visit Summary.  MyChart is used to connect with patients for Virtual Visits (Telemedicine).  Patients are able to view lab/test results, encounter notes, upcoming  appointments, etc.  Non-urgent messages can be sent to your provider as well.   To learn more about what you can do with MyChart, go to [email or web address].    Your next appointment:   6 month(s)  Provider:   Jenkins Rouge, MD

## 2022-12-23 ENCOUNTER — Ambulatory Visit (HOSPITAL_COMMUNITY): Payer: Medicare Other | Attending: Cardiology

## 2022-12-23 DIAGNOSIS — R072 Precordial pain: Secondary | ICD-10-CM | POA: Insufficient documentation

## 2022-12-23 LAB — MYOCARDIAL PERFUSION IMAGING
LV dias vol: 91 mL (ref 62–150)
LV sys vol: 44 mL
Nuc Stress EF: 52 %
Peak HR: 95 {beats}/min
Rest HR: 67 {beats}/min
Rest Nuclear Isotope Dose: 10.6 mCi
SDS: 3
SRS: 0
SSS: 3
ST Depression (mm): 0 mm
Stress Nuclear Isotope Dose: 31.6 mCi
TID: 1.08

## 2022-12-23 MED ORDER — REGADENOSON 0.4 MG/5ML IV SOLN
0.4000 mg | Freq: Once | INTRAVENOUS | Status: AC
  Administered 2022-12-23: 0.4 mg via INTRAVENOUS

## 2022-12-23 MED ORDER — TECHNETIUM TC 99M TETROFOSMIN IV KIT
31.6000 | PACK | Freq: Once | INTRAVENOUS | Status: AC | PRN
  Administered 2022-12-23: 31.6 via INTRAVENOUS

## 2022-12-23 MED ORDER — TECHNETIUM TC 99M TETROFOSMIN IV KIT
10.6000 | PACK | Freq: Once | INTRAVENOUS | Status: AC | PRN
  Administered 2022-12-23: 10.6 via INTRAVENOUS

## 2022-12-30 ENCOUNTER — Telehealth: Payer: Self-pay | Admitting: Cardiovascular Disease

## 2022-12-30 DIAGNOSIS — Z87891 Personal history of nicotine dependence: Secondary | ICD-10-CM

## 2022-12-30 NOTE — Telephone Encounter (Signed)
Pt requesting that location of CT on 2/26 be changed to [facility], he would also like a callback regarding this matter. Please advise

## 2022-12-30 NOTE — Telephone Encounter (Signed)
Left message for patient to call back. Will see if patient's CT can be moved to [facility] location.

## 2022-12-31 NOTE — Telephone Encounter (Signed)
Patient canceled his CT and told scheduler that he would be getting CT ordered with his PCP.

## 2023-01-04 ENCOUNTER — Ambulatory Visit (HOSPITAL_COMMUNITY): Payer: Medicare Other

## 2023-01-04 NOTE — Telephone Encounter (Signed)
Had to reorder CT so patient can get scheduled at his preferred location.

## 2023-01-04 NOTE — Addendum Note (Signed)
Addended by: Aris Georgia, Daquann Merriott L on: 01/04/2023 03:30 PM   Modules accepted: Orders

## 2023-03-11 ENCOUNTER — Inpatient Hospital Stay: Admission: RE | Admit: 2023-03-11 | Payer: Medicare Other | Source: Ambulatory Visit

## 2023-03-17 NOTE — Progress Notes (Deleted)
Cardiology Office Note:    Date:  03/17/2023   ID:  Daniel Hurley, DOB 01-02-1956, MRN [id number]  PCP:  Lavada Mesi, MD   [facility] Providers Cardiologist:  Charlton Haws, MD     Referring MD: Lavada Mesi, MD   No chief complaint on file.   History of Present Illness:    Daniel Hurley is a 67 y.o. male I have not seen since November 2019 Last seen by Dr Excell Seltzer 07/06/22 .  He had a normal stress echocardiogram 09/19/18 with no evidence of wall motion abnormality or pulmonary hypertension. Cath 07/08/22 resulted in stent to proximal LAD with mild non obstructive dx in ostial RCA/LCX and IM branches Still smoking a ppd No recent chest CT or CXR in Epic CRFls also include HTN PCP called 12/16/22 indicating patient was having chest tightness with dyspnea and radiation down left arm this past weekend Better the remainder of this week   Pain is atypical muscular left arm Has not returned this week Under a lot of stress looking after his 73 yo mom "who is crazy"  Two boys Daniel Hurley and Daniel Hurley Former played baseball with my son when younger.   Myovue done 12/23/22 normal no ischemia normal EF   Discussed smoking cessation and need for f/u Lung cancer CT  ***    Past Medical History:  Diagnosis Date   COPD (chronic obstructive pulmonary disease) (HCC)    Hypertension 02/12/2020    Past Surgical History:  Procedure Laterality Date   ANKLE FUSION  2006   BACK SURGERY  2008   L5-5 fusion    CORONARY PRESSURE/FFR STUDY N/A 07/08/2022   Procedure: INTRAVASCULAR PRESSURE WIRE/FFR STUDY;  Surgeon: Kathleene Hazel, MD;  Location: [facility];  Service: Cardiovascular;  Laterality: N/A;   CORONARY STENT INTERVENTION N/A 07/08/2022   Procedure: CORONARY STENT INTERVENTION;  Surgeon: Kathleene Hazel, MD;  Location: [facility];  Service: Cardiovascular;  Laterality: N/A;   LEFT HEART CATH AND CORONARY ANGIOGRAPHY N/A 07/08/2022   Procedure: LEFT HEART CATH  AND CORONARY ANGIOGRAPHY;  Surgeon: Kathleene Hazel, MD;  Location: [facility];  Service: Cardiovascular;  Laterality: N/A;   left wrist surgery      Current Medications: No outpatient medications have been marked as taking for the 03/24/23 encounter (Appointment) with Wendall Stade, MD.     Allergies:   Patient has no known allergies.   Social History   Socioeconomic History   Marital status: Married    Spouse name: Not on file   Number of children: Not on file   Years of education: Not on file   Highest education level: Not on file  Occupational History   Not on file  Tobacco Use   Smoking status: Every Day    Packs/day: 1.5    Types: Cigarettes   Smokeless tobacco: Never  Substance and Sexual Activity   Alcohol use: No   Drug use: No   Sexual activity: Not on file  Other Topics Concern   Not on file  Social History Narrative   Not on file   Social Determinants of Health   Financial Resource Strain: Not on file  Food Insecurity: Not on file  Transportation Needs: Not on file  Physical Activity: Not on file  Stress: Not on file  Social Connections: Not on file     Family History: The patient's family history is not on file.  ROS:   Please see the  history of present illness.    All other systems reviewed and are negative.  EKGs/Labs/Other Studies Reviewed:    Cath 07/08/22 Conclusion      Prox RCA lesion is 20% stenosed.   Ost Cx to Prox Cx lesion is 40% stenosed.   Ramus lesion is 40% stenosed.   Prox LAD to Mid LAD lesion is 70% stenosed.   A drug-eluting stent was successfully placed using a SYNERGY XD 2.75X28.   Post intervention, there is a 0% residual stenosis.   Severe mid LAD stenosis. RFR 0.84.  Mild non-obstructive disease in the ostial Circumflex and intermediate branch.  Mild non-obstructive disease in the mid RCA Successful PTCA/DES x 1 mid LAD   Recommendations: Continue DAPT with ASA and Plavix for at least six  months. Continue beta blocker and statin.     The following studies were reviewed today: Stress Echo 09/19/2018: Impressions:   - Normal exercise stress echo.    Normal hemodynamic response    No arrhythmias   CT Chest 2019: IMPRESSION: 1. There is soft tissue asymmetry in the low neck in the region the right piriform sinus of uncertain significance. Either MRI of the neck or ENT assessment may be warranted. Correlate clinically. 2. No pneumonia or pleural effusion. 3. Mild centrilobular emphysema. 4. Mild thoracic aortic atherosclerosis with coronary artery calcifications present as noted above. 5. Single nonobstructing 7 mm left renal calculus.  EKG: 03/17/2023 NSR rate 83 normal ECG   Recent Labs: 07/06/2022: BUN 8; Creatinine, Ser 1.23; Hemoglobin 17.7; Platelets 282; Potassium 4.4; Sodium 139  Recent Lipid Panel    Component Value Date/Time   CHOL 140 07/06/2022 0930   TRIG 80 07/06/2022 0930   HDL 40 07/06/2022 0930   CHOLHDL 3.5 07/06/2022 0930   CHOLHDL 3.5 02/12/2020 1541   VLDL 17 01/20/2016 0620   LDLCALC 84 07/06/2022 0930   LDLCALC 102 (H) 02/12/2020 1541     Physical Exam:    VS:  There were no vitals taken for this visit.    Wt Readings from Last 3 Encounters:  12/23/22 185 lb (83.9 kg)  12/18/22 185 lb (83.9 kg)  07/06/22 196 lb 9.6 oz (89.2 kg)    Affect appropriate COPD HEENT: normal Neck supple with no adenopathy JVP normal no bruits no thyromegaly Lungs mild exp wheezing  Heart:  S1/S2 no murmur, no rub, gallop or click PMI normal Abdomen: benighn, BS positve, no tenderness, no AAA no bruit.  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

## 2023-03-24 ENCOUNTER — Ambulatory Visit: Payer: Medicare Other | Admitting: Cardiovascular Disease

## 2023-03-30 ENCOUNTER — Other Ambulatory Visit: Payer: Self-pay | Admitting: Family Medicine

## 2023-03-30 DIAGNOSIS — F17200 Nicotine dependence, unspecified, uncomplicated: Secondary | ICD-10-CM

## 2023-04-02 ENCOUNTER — Other Ambulatory Visit: Payer: Self-pay | Admitting: Family Medicine

## 2023-04-02 DIAGNOSIS — F17200 Nicotine dependence, unspecified, uncomplicated: Secondary | ICD-10-CM

## 2023-05-04 ENCOUNTER — Ambulatory Visit
Admission: RE | Admit: 2023-05-04 | Discharge: 2023-05-04 | Disposition: A | Discharge status: Home / Self Care | Payer: Medicare Other | Source: Ambulatory Visit | Attending: Family Medicine | Admitting: Family Medicine

## 2023-05-04 DIAGNOSIS — F17200 Nicotine dependence, unspecified, uncomplicated: Secondary | ICD-10-CM

## 2023-06-18 ENCOUNTER — Ambulatory Visit: Payer: Medicare Other | Admitting: Cardiovascular Disease

## 2023-10-28 ENCOUNTER — Other Ambulatory Visit: Payer: Self-pay | Admitting: Family Medicine

## 2023-10-28 DIAGNOSIS — M25561 Pain in right knee: Secondary | ICD-10-CM

## 2023-11-19 ENCOUNTER — Ambulatory Visit
Admission: RE | Admit: 2023-11-19 | Discharge: 2023-11-19 | Disposition: A | Discharge status: Home / Self Care | Payer: Medicare Other | Source: Ambulatory Visit | Attending: Family Medicine | Admitting: Family Medicine

## 2023-11-19 DIAGNOSIS — M25561 Pain in right knee: Secondary | ICD-10-CM

## 2023-11-29 ENCOUNTER — Other Ambulatory Visit: Payer: Self-pay

## 2023-11-29 ENCOUNTER — Ambulatory Visit: Payer: Medicare Other | Admitting: Orthopedic Surgery

## 2023-11-29 ENCOUNTER — Encounter: Payer: Self-pay | Admitting: Orthopedic Surgery

## 2023-11-29 DIAGNOSIS — M25561 Pain in right knee: Secondary | ICD-10-CM | POA: Diagnosis not present

## 2023-11-29 DIAGNOSIS — G8929 Other chronic pain: Secondary | ICD-10-CM | POA: Diagnosis not present

## 2023-11-30 ENCOUNTER — Encounter: Payer: Self-pay | Admitting: Orthopedic Surgery

## 2023-11-30 NOTE — Progress Notes (Unsigned)
Office Visit Note   Patient: Daniel Hurley           Date of Birth: Jul 23, 1956           MRN: [id number] Visit Date: 11/29/2023 Requested by: Lavada Mesi, MD [address] [place],  [place] [address] PCP: Lavada Mesi, MD  Subjective: Chief Complaint  Patient presents with   Right Knee - Follow-up    MRI review    HPI: Daniel Hurley is a 68 y.o. male who presents to the office reporting right knee pain.  Patient describes 71-month history of right knee pain with atraumatic onset but he noticed it when he was playing golf.  He enjoys playing golf at least twice a week.  The knee has been drained and injected twice.  Reports primarily medial sided pain with some patellofemoral symptoms as well.  Worse going up and down stairs and he also has difficulty sleeping.  Takes hydrocodone as needed.  Does have some radiation down the leg.  Hurts especially getting out of a vehicle.  He did have a cardiac catheterization 8/23 and a recent stress test is described as "good"..                ROS: All systems reviewed are negative as they relate to the chief complaint within the history of present illness.  Patient denies fevers or chills.  Assessment & Plan: Visit Diagnoses:  1. Chronic pain of right knee     Plan: Impression is end-stage right knee medial compartment arthritis.  He has high-grade cartilage loss of the weightbearing medial femoral-tibial compartment with complex tear of the meniscus.  There is also high-grade patellofemoral cartilage loss and arthritis.  I talked with Chrissie Noa at length today about operative and nonoperative treatment for this problem.  In general arthroscopic treatment would not predictably be effective at addressing his main pain generators.  He has failed a long course of nonoperative treatment including activity modification injections as well as strengthening exercises.  He does have mild varus alignment which is putting additional stress on  that medial compartment.  The risk and benefits of knee replacement are discussed with the patient including not limited to infection or vessel damage incomplete pain relief as well as incomplete restoration of function.  Cardiac restratification indicated based on his history.  Patient understands risk and benefits as well as the expected rehabilitation time and effort required.  All questions answered  Follow-Up Instructions: No follow-ups on file.   Orders:  Orders Placed This Encounter  Procedures   XR KNEE 3 VIEW RIGHT   No orders of the defined types were placed in this encounter.     Procedures: No procedures performed   Clinical Data: No additional findings.  Objective: Vital Signs: There were no vitals taken for this visit.  Physical Exam:  Constitutional: Patient appears well-developed HEENT:  Head: Normocephalic Eyes:EOM are normal Neck: Normal range of motion Cardiovascular: Normal rate Pulmonary/chest: Effort normal Neurologic: Patient is alert Skin: Skin is warm Psychiatric: Patient has normal mood and affect  Ortho Exam: Examination demonstrates mild right knee effusion with intact extensor mechanism.  Patient lacks about 3 degrees of full extension but has full flexion.  Has patellofemoral crepitus bilaterally as well as medial greater than lateral joint line tenderness on the right.  No other masses lymphadenopathy or skin changes noted in that right knee region.  No groin pain with internal/external rotation of the leg.  Pedal pulses palpable.  Foot  dorsiflexion intact  Specialty Comments:  No specialty comments available.  Imaging: No results found.   PMFS History: Patient Active Problem List   Diagnosis Date Noted   Unstable angina (HCC)    Chronic neck pain 11/22/2020   Hypertension 02/12/2020   Acute respiratory failure with hypoxia (HCC) 01/20/2016   Chest pain 01/19/2016   COPD exacerbation (HCC) 01/19/2016   Tobacco abuse 01/19/2016    Past Medical History:  Diagnosis Date   COPD (chronic obstructive pulmonary disease) (HCC)    Hypertension 02/12/2020    No family history on file.  Past Surgical History:  Procedure Laterality Date   ANKLE FUSION  2006   BACK SURGERY  2008   L5-5 fusion    CORONARY PRESSURE/FFR STUDY N/A 07/08/2022   Procedure: INTRAVASCULAR PRESSURE WIRE/FFR STUDY;  Surgeon: Kathleene Hazel, MD;  [facility];  Service: Cardiovascular;  Laterality: N/A;   CORONARY STENT INTERVENTION N/A 07/08/2022   Procedure: CORONARY STENT INTERVENTION;  Surgeon: Kathleene Hazel, MD;  Location: [facility];  Service: Cardiovascular;  Laterality: N/A;   LEFT HEART CATH AND CORONARY ANGIOGRAPHY N/A 07/08/2022   Procedure: LEFT HEART CATH AND CORONARY ANGIOGRAPHY;  Surgeon: Kathleene Hazel, MD;  Location: [facility];  Service: Cardiovascular;  Laterality: N/A;   left wrist surgery     Social History   Occupational History   Not on file  Tobacco Use   Smoking status: Every Day    Current packs/day: 1.50    Types: Cigarettes   Smokeless tobacco: Never  Substance and Sexual Activity   Alcohol use: No   Drug use: No   Sexual activity: Not on file

## 2023-12-01 ENCOUNTER — Telehealth: Payer: Self-pay | Admitting: *Deleted

## 2023-12-01 ENCOUNTER — Telehealth (HOSPITAL_BASED_OUTPATIENT_CLINIC_OR_DEPARTMENT_OTHER): Payer: Self-pay | Admitting: *Deleted

## 2023-12-01 NOTE — Telephone Encounter (Signed)
Pt has been scheduled tele preop appt 12/14/22. Med rec and consent are done.     Patient Consent for Virtual Visit        [name] has provided verbal consent on 12/01/2023 for a virtual visit (video or telephone).   CONSENT FOR VIRTUAL VISIT FOR:  Daniel Hurley  By participating in this virtual visit I agree to the following:  I hereby voluntarily request, consent and authorize [facility] and its employed or contracted physicians, physician assistants, nurse practitioners or other licensed health care professionals (the Practitioner), to provide me with telemedicine health care services (the "Services") as deemed necessary by the treating Practitioner. I acknowledge and consent to receive the Services by the Practitioner via telemedicine. I understand that the telemedicine visit will involve communicating with the Practitioner through live audiovisual communication technology and the disclosure of certain medical information by electronic transmission. I acknowledge that I have been given the opportunity to request an in-person assessment or other available alternative prior to the telemedicine visit and am voluntarily participating in the telemedicine visit.  I understand that I have the right to withhold or withdraw my consent to the use of telemedicine in the course of my care at any time, without affecting my right to future care or treatment, and that the Practitioner or I may terminate the telemedicine visit at any time. I understand that I have the right to inspect all information obtained and/or recorded in the course of the telemedicine visit and may receive copies of available information for a reasonable fee.  I understand that some of the potential risks of receiving the Services via telemedicine include:  Delay or interruption in medical evaluation due to technological equipment failure or disruption; Information transmitted may not be sufficient (e.g. poor  resolution of images) to allow for appropriate medical decision making by the Practitioner; and/or  In rare instances, security protocols could fail, causing a breach of personal health information.  Furthermore, I acknowledge that it is my responsibility to provide information about my medical history, conditions and care that is complete and accurate to the best of my ability. I acknowledge that Practitioner's advice, recommendations, and/or decision may be based on factors not within their control, such as incomplete or inaccurate data provided by me or distortions of diagnostic images or specimens that may result from electronic transmissions. I understand that the practice of medicine is not an exact science and that Practitioner makes no warranties or guarantees regarding treatment outcomes. I acknowledge that a copy of this consent can be made available to me via my patient portal [facility] MyChart), or I can request a printed copy by calling the office of [facility].    I understand that my insurance will be billed for this visit.   I have read or had this consent read to me. I understand the contents of this consent, which adequately explains the benefits and risks of the Services being provided via telemedicine.  I have been provided ample opportunity to ask questions regarding this consent and the Services and have had my questions answered to my satisfaction. I give my informed consent for the services to be provided through the use of telemedicine in my medical care

## 2023-12-01 NOTE — Telephone Encounter (Signed)
   Pre-operative Risk Assessment    Patient Name: Daniel Hurley  DOB: 14-Apr-1956 MRN: [id number]   Date of last office visit: 12/18/2022 Date of next office visit: None   Request for Surgical Clearance    Procedure:   Right Inguinal Hernia Repair w/mesh  Date of Surgery:  Clearance TBD                                 Surgeon:  Dr. Emelia Loron Surgeon's Group or Practice Name:  [facility] Phone number:  [phone] Fax number:  [phone]   Type of Clearance Requested:   - Medical  - Pharmacy:  Hold Aspirin and Clopidogrel (Plavix) Not Indicated.   Type of Anesthesia:  General    Additional requests/questions:    Signed, Emmit Pomfret   12/01/2023, 12:42 PM

## 2023-12-01 NOTE — Telephone Encounter (Signed)
   Name: Daniel Hurley  DOB: 1956-03-03  MRN: [id number]  Primary Cardiologist: Charlton Haws, MD   Preoperative team, please contact this patient and set up a phone call appointment for further preoperative risk assessment. Please obtain consent and complete medication review. Thank you for your help.  I confirm that guidance regarding antiplatelet and oral anticoagulation therapy has been completed and, if necessary, noted below.  Per [name] patient can hold Plavix 5 days prior to procedure and should continue aspirin through the perioperative period.  I also confirmed the patient resides in the state of [place]. As per [facility] Medical Board telemedicine laws, the patient must reside in the state in which the provider is licensed.   [name], Leodis Rains, NP 12/01/2023, 3:20 PM [facility]

## 2023-12-01 NOTE — Telephone Encounter (Signed)
Pt has been scheduled tele preop appt 12/14/22. Med rec and consent are done.

## 2023-12-01 NOTE — Telephone Encounter (Signed)
[name]  We have received a surgical clearance request for Mr. Mckechnie for upcoming right inguinal hernia repair with mesh.  He has a PMH of CAD with proximal LAD stent placed on 07/08/2022.  He completed a Lexiscan on 12/23/2022 that was normal.  Due to the proximity of his stent guidance for holding Plavix has to come from his primary cardiologist.  Can you please comment on guidance for holding Plavix and aspirin for his upcoming hernia procedure. Please forward you guidance and recommendations to P CV DIV PREOP   Thanks, Robin Searing, NP

## 2023-12-02 ENCOUNTER — Telehealth: Payer: Self-pay | Admitting: Cardiovascular Disease

## 2023-12-02 NOTE — Telephone Encounter (Signed)
I s/w the pt and did tell him that we just got an opening 12/09/23. I will update the surgeon office as well.

## 2023-12-02 NOTE — Telephone Encounter (Signed)
Pt wants a call back. He is upset his Pre-Op Tele Visit is two weeks out. He would like a call back

## 2023-12-02 NOTE — Telephone Encounter (Addendum)
Daniel Hurley  BG   12/02/23  9:51 AM Note Pt wants a call back. He is upset his Pre-Op Tele Visit is two weeks out. He would like a call back      I s/w the pt and did tell him that we just got an opening 12/09/23. I will update the surgeon office as well.      I explained to the pt that the schedules are very full and we do all we can to delay any surgery/procedure.

## 2023-12-09 ENCOUNTER — Ambulatory Visit: Payer: Medicare Other | Attending: Cardiology

## 2023-12-09 DIAGNOSIS — Z01810 Encounter for preprocedural cardiovascular examination: Secondary | ICD-10-CM | POA: Diagnosis not present

## 2023-12-09 NOTE — Progress Notes (Signed)
Virtual Visit via Telephone Note   Because of Daniel Hurley's co-morbid illnesses, he is at least at moderate risk for complications without adequate follow up.  This format is felt to be most appropriate for this patient at this time.  The patient did not have access to video technology/had technical difficulties with video requiring transitioning to audio format only (telephone).  All issues noted in this document were discussed and addressed.  No physical exam could be performed with this format.  Please refer to the patient's chart for his consent to telehealth for [facility].  Evaluation Performed:  Preoperative cardiovascular risk assessment _____________   Date:  12/09/2023   Patient ID:  Daniel Hurley, DOB 12-May-1956, MRN [id number] Patient Location:  Home Provider location:   Office  Primary Care Provider:  Lavada Mesi, MD Primary Cardiologist:  Charlton Haws, MD  Chief Complaint / Patient Profile   68 y.o. y/o male with a h/o COPD, hypertension, coronary artery disease who is pending right inguinal hernia repair with mesh and presents today for telephonic preoperative cardiovascular risk assessment.  History of Present Illness    Daniel Hurley is a 68 y.o. male who presents via audio/video conferencing for a telehealth visit today.  Pt was last seen in cardiology clinic on 12/18/2022 by [name].  At that time Daniel Hurley had a nuclear stress test ordered.  Stress testing showed low risk and normal EF.  No ischemia was noted.  The patient is now pending procedure as outlined above. Since his last visit, he remains stable from a cardiac standpoint.  Today he denies chest pain, shortness of breath, lower extremity edema, fatigue, palpitations, melena, hematuria, hemoptysis, diaphoresis, weakness, presyncope, syncope, orthopnea, and PND.   Past Medical History    Past Medical History:  Diagnosis Date   COPD (chronic obstructive pulmonary disease)  (HCC)    Hypertension 02/12/2020   Past Surgical History:  Procedure Laterality Date   ANKLE FUSION  2006   BACK SURGERY  2008   L5-5 fusion    CORONARY PRESSURE/FFR STUDY N/A 07/08/2022   Procedure: INTRAVASCULAR PRESSURE WIRE/FFR STUDY;  Surgeon: Kathleene Hazel, MD;  Location: [facility];  Service: Cardiovascular;  Laterality: N/A;   CORONARY STENT INTERVENTION N/A 07/08/2022   Procedure: CORONARY STENT INTERVENTION;  Surgeon: Kathleene Hazel, MD;  Location: [facility];  Service: Cardiovascular;  Laterality: N/A;   LEFT HEART CATH AND CORONARY ANGIOGRAPHY N/A 07/08/2022   Procedure: LEFT HEART CATH AND CORONARY ANGIOGRAPHY;  Surgeon: Kathleene Hazel, MD;  Location: [facility];  Service: Cardiovascular;  Laterality: N/A;   left wrist surgery      Allergies  No Known Allergies  Home Medications    Prior to Admission medications   Medication Sig Start Date End Date Taking? Authorizing Provider  albuterol (VENTOLIN HFA) 108 (90 Base) MCG/ACT inhaler Inhale 2 puffs into the lungs every 6 (six) hours as needed for wheezing or shortness of breath. 03/08/20   Hilts, Casimiro Needle, MD  amLODipine (NORVASC) 5 MG tablet Take 1 tablet (5 mg total) by mouth daily. 05/16/21   Hilts, Michael, MD  aspirin EC 81 MG tablet Take 1 tablet (81 mg total) by mouth daily. Swallow whole. 07/06/22   Tonny Bollman, MD  chlorzoxazone (PARAFON FORTE DSC) 500 MG tablet Take 1 tablet (500 mg total) by mouth 4 (four) times daily as needed for muscle spasms. 12/20/20   Hilts, Casimiro Needle, MD  clopidogrel (PLAVIX)  75 MG tablet Take 1 tablet (75 mg total) by mouth daily with breakfast. 07/09/22   Laverda Page B, NP  fluticasone-salmeterol (ADVAIR DISKUS) 500-50 MCG/ACT AEPB Inhale 1 puff into the lungs in the morning and at bedtime. 07/21/21   Cammy Copa, MD  HYDROcodone-acetaminophen (NORCO) 7.5-325 MG tablet Take 1 tablet by mouth every 6 (six) hours as needed for moderate  pain. 06/10/21   Hilts, Casimiro Needle, MD  lisinopril (ZESTRIL) 10 MG tablet Take 1 tablet (10 mg total) by mouth daily. 05/16/21   Hilts, Casimiro Needle, MD  nitroGLYCERIN (NITROSTAT) 0.4 MG SL tablet Dissolve 1 tablet under the tongue every 5 minutes as needed for chest pain. Max of 3 doses, then 911. 12/18/22   Wendall Stade, MD  rosuvastatin (CRESTOR) 40 MG tablet Take 1 tablet (40 mg total) by mouth daily. 07/08/22 01/04/23  Arty Baumgartner, NP  sildenafil (REVATIO) 20 MG tablet Take 1 tablet (20 mg total) by mouth 2 (two) times daily. Patient taking differently: Take 20 mg by mouth daily as needed (ED). 12/23/20   Hilts, Casimiro Needle, MD  testosterone cypionate (DEPOTESTOSTERONE CYPIONATE) 200 MG/ML injection Inject 100 mg into the skin every 7 (seven) days. 04/28/22   [provider]    Physical Exam    Vital Signs:  Daniel Hurley does not have vital signs available for review today.  Given telephonic nature of communication, physical exam is limited. AAOx3. NAD. Normal affect.  Speech and respirations are unlabored.  Accessory Clinical Findings    None  Assessment & Plan    1.  Preoperative Cardiovascular Risk Assessment: Right Inguinal Hernia Repair w/mesh , Dr. Emelia Loron Surgeon's Group or Practice Name:  [facility] Fax number:  [phone]      Primary Cardiologist: Charlton Haws, MD  Chart reviewed as part of pre-operative protocol coverage. Given past medical history and time since last visit, based on ACC/AHA guidelines, [name] would be at acceptable risk for the planned procedure without further cardiovascular testing.   His RCRI is low risk, 0.9% risk of major cardiac event.  He is able to complete greater than 4 METS of physical activity.  Per [name] patient can hold Plavix 5 days prior to procedure and should continue aspirin through the perioperative period.   Patient was advised that if he develops new symptoms prior to surgery to  contact our office to arrange a follow-up appointment.  He verbalized understanding.  I will route this recommendation to the requesting party via Epic fax function and remove from pre-op pool.       Time:   Today, I have spent 7 minutes with the patient with telehealth technology discussing medical history, symptoms, and management plan.     Ronney Asters, NP  12/09/2023, 6:58 AM

## 2023-12-15 ENCOUNTER — Ambulatory Visit: Payer: Medicare Other

## 2023-12-21 NOTE — Progress Notes (Signed)
Surgical Instructions   Your procedure is scheduled on December 27, 2023. Report to [facility] Main Entrance "A" at 1:30 P.M., then check in with the Admitting office. Any questions or running late day of surgery: call [phone]  Questions prior to your surgery date: call [phone], Monday-Friday, 8am-4pm. If you experience any cold or flu symptoms such as cough, fever, chills, shortness of breath, etc. between now and your scheduled surgery, please notify us at the above number.     Remember:  Do not eat after midnight the night before your surgery   You may drink clear liquids until 12:30 the day of your surgery.   Clear liquids allowed are: Water, Non-Citrus Juices (without pulp), Carbonated Beverages, Clear Tea (no milk, honey, etc.), Black Coffee Only (NO MILK, CREAM OR POWDERED CREAMER of any kind), and Gatorade.    Take these medicines the morning of surgery with A SIP OF WATER  amLODipine (NORVASC)  TRELEGY ELLIPTA  rosuvastatin (CRESTOR)   May take these medicines IF NEEDED: baclofen (LIORESAL)  HYDROcodone-acetaminophen (NORCO)  nitroGLYCERIN (NITROSTAT)    One week prior to surgery, STOP taking any Aspirin (unless otherwise instructed by your surgeon) Aleve, Naproxen, Ibuprofen, Motrin, Advil, Goody's, BC's, all herbal medications, fish oil, and non-prescription vitamins.                     Do NOT Smoke (Tobacco/Vaping) for 24 hours prior to your procedure.  If you use a CPAP at night, you may bring your mask/headgear for your overnight stay.   You will be asked to remove any contacts, glasses, piercing's, hearing aid's, dentures/partials prior to surgery. Please bring cases for these items if needed.    Patients discharged the day of surgery will not be allowed to drive home, and someone needs to stay with them for 24 hours.  SURGICAL WAITING ROOM VISITATION Patients may have no more than 2 support people in the waiting area - these visitors may rotate.    Pre-op nurse will coordinate an appropriate time for 1 ADULT support person, who may not rotate, to accompany patient in pre-op.  Children under the age of 14 must have an adult with them who is not the patient and must remain in the main waiting area with an adult.  If the patient needs to stay at the hospital during part of their recovery, the visitor guidelines for inpatient rooms apply.  Please refer to the [facility] website for the visitor guidelines for any additional information.   If you received a COVID test during your pre-op visit  it is requested that you wear a mask when out in public, stay away from anyone that may not be feeling well and notify your surgeon if you develop symptoms. If you have been in contact with anyone that has tested positive in the last 10 days please notify you surgeon.      Pre-operative CHG Bathing Instructions   You can play a key role in reducing the risk of infection after surgery. Your skin needs to be as free of germs as possible. You can reduce the number of germs on your skin by washing with CHG (chlorhexidine gluconate) soap before surgery. CHG is an antiseptic soap that kills germs and continues to kill germs even after washing.   DO NOT use if you have an allergy to chlorhexidine/CHG or antibacterial soaps. If your skin becomes reddened or irritated, stop using the CHG and notify one of our RNs at [phone].  TAKE A SHOWER THE NIGHT BEFORE SURGERY AND THE DAY OF SURGERY    Please keep in mind the following:  DO NOT shave, including legs and underarms, 48 hours prior to surgery.   You may shave your face before/day of surgery.  Place clean sheets on your bed the night before surgery Use a clean washcloth (not used since being washed) for each shower. DO NOT sleep with pet's night before surgery.  CHG Shower Instructions:  Wash your face and private area with normal soap. If you choose to wash your hair, wash first with  your normal shampoo.  After you use shampoo/soap, rinse your hair and body thoroughly to remove shampoo/soap residue.  Turn the water OFF and apply half the bottle of CHG soap to a CLEAN washcloth.  Apply CHG soap ONLY FROM YOUR NECK DOWN TO YOUR TOES (washing for 3-5 minutes)  DO NOT use CHG soap on face, private areas, open wounds, or sores.  Pay special attention to the area where your surgery is being performed.  If you are having back surgery, having someone wash your back for you may be helpful. Wait 2 minutes after CHG soap is applied, then you may rinse off the CHG soap.  Pat dry with a clean towel  Put on clean pajamas    Additional instructions for the day of surgery: DO NOT APPLY any lotions, deodorants, cologne, or perfumes.   Do not wear jewelry or makeup Do not wear nail polish, gel polish, artificial nails, or any other type of covering on natural nails (fingers and toes) Do not bring valuables to the hospital. [facility] is not responsible for valuables/personal belongings. Put on clean/comfortable clothes.  Please brush your teeth.  Ask your nurse before applying any prescription medications to the skin.

## 2023-12-22 ENCOUNTER — Encounter (HOSPITAL_COMMUNITY): Payer: Self-pay

## 2023-12-22 ENCOUNTER — Encounter (HOSPITAL_COMMUNITY)
Admission: RE | Admit: 2023-12-22 | Discharge: 2023-12-22 | Disposition: A | Discharge status: Home / Self Care | Payer: Medicare Other | Source: Ambulatory Visit | Attending: General Surgery

## 2023-12-22 ENCOUNTER — Other Ambulatory Visit: Payer: Self-pay

## 2023-12-22 ENCOUNTER — Other Ambulatory Visit: Payer: Self-pay | Admitting: General Surgery

## 2023-12-22 VITALS — BP 141/92 | HR 76 | Temp 98.7°F | Resp 18 | Ht 68.0 in | Wt 180.0 lb

## 2023-12-22 DIAGNOSIS — J449 Chronic obstructive pulmonary disease, unspecified: Secondary | ICD-10-CM | POA: Diagnosis not present

## 2023-12-22 DIAGNOSIS — Z01810 Encounter for preprocedural cardiovascular examination: Secondary | ICD-10-CM | POA: Diagnosis present

## 2023-12-22 DIAGNOSIS — Z01818 Encounter for other preprocedural examination: Secondary | ICD-10-CM

## 2023-12-22 DIAGNOSIS — I2510 Atherosclerotic heart disease of native coronary artery without angina pectoris: Secondary | ICD-10-CM | POA: Insufficient documentation

## 2023-12-22 DIAGNOSIS — Z981 Arthrodesis status: Secondary | ICD-10-CM | POA: Insufficient documentation

## 2023-12-22 DIAGNOSIS — Z955 Presence of coronary angioplasty implant and graft: Secondary | ICD-10-CM | POA: Diagnosis not present

## 2023-12-22 DIAGNOSIS — Z7902 Long term (current) use of antithrombotics/antiplatelets: Secondary | ICD-10-CM | POA: Insufficient documentation

## 2023-12-22 DIAGNOSIS — F17200 Nicotine dependence, unspecified, uncomplicated: Secondary | ICD-10-CM | POA: Insufficient documentation

## 2023-12-22 DIAGNOSIS — I10 Essential (primary) hypertension: Secondary | ICD-10-CM | POA: Diagnosis not present

## 2023-12-22 DIAGNOSIS — Z01812 Encounter for preprocedural laboratory examination: Secondary | ICD-10-CM | POA: Diagnosis present

## 2023-12-22 DIAGNOSIS — K4090 Unilateral inguinal hernia, without obstruction or gangrene, not specified as recurrent: Secondary | ICD-10-CM | POA: Diagnosis not present

## 2023-12-22 HISTORY — DX: Atherosclerotic heart disease of native coronary artery without angina pectoris: I25.10

## 2023-12-22 LAB — BASIC METABOLIC PANEL
Anion gap: 9 (ref 5–15)
BUN: 11 mg/dL (ref 8–23)
CO2: 27 mmol/L (ref 22–32)
Calcium: 9.2 mg/dL (ref 8.9–10.3)
Chloride: 103 mmol/L (ref 98–111)
Creatinine, Ser: 1.11 mg/dL (ref 0.61–1.24)
GFR, Estimated: >60 mL/min (ref >60)
Glucose, Bld: 100 mg/dL — ABNORMAL HIGH (ref 70–99)
Potassium: 3.9 mmol/L (ref 3.5–5.1)
Sodium: 139 mmol/L (ref 135–145)

## 2023-12-22 LAB — CBC
HCT: 49.8 % (ref 39.0–52.0)
Hemoglobin: 16.5 g/dL (ref 13.0–17.0)
MCH: 30.7 pg (ref 26.0–34.0)
MCHC: 33.1 g/dL (ref 30.0–36.0)
MCV: 92.7 fL (ref 80.0–100.0)
Platelets: 207 10*3/uL (ref 150–400)
RBC: 5.37 MIL/uL (ref 4.22–5.81)
RDW: 13.5 % (ref 11.5–15.5)
WBC: 8.3 10*3/uL (ref 4.0–10.5)
nRBC: 0 % (ref 0.0–0.2)

## 2023-12-22 NOTE — Progress Notes (Signed)
PCP -  Dr. Lavada Mesi Cardiologist - Dr. Charlton Haws - clearance on 12/01/23  PPM/ICD - Denies Device Orders - n/a Rep Notified - n/a  Chest x-ray - n/a EKG - 12-22-23 Stress Test - 12-23-22 ECHO - 09-19-18 Cardiac Cath - 07-08-22  Sleep Study - Denies CPAP - n/a  Fasting Blood Sugar - Denies Checks Blood Sugar _____ times a day - N/A  Last dose of GLP1 agonist-  Denies GLP1 instructions: n/a  Blood Thinner Instructions: Plavix - patient stopped taking on 12/01/23 when he was told he was cleared for surgery. According to cardiology he only needed to stop for 5 days prior to surgery. Aspirin Instructions: was instructed to continue aspirin per cardiology.   ERAS Protcol - ERAS Clears until 12:30 PRE-SURGERY Ensure or G2- Ensure  COVID TEST- No   Anesthesia review: Yes, - HTN w/new EKG, COPD, cardiac clearance on 12/01/23 by [name]   Patient denies shortness of breath, fever, cough and chest pain at PAT appointment. Patient denies any respiratory illnesses at this time .   All instructions explained to the patient, with a verbal understanding of the material. Patient agrees to go over the instructions while at home for a better understanding. Patient also instructed to self quarantine after being tested for COVID-19. The opportunity to ask questions was provided.

## 2023-12-23 ENCOUNTER — Encounter (HOSPITAL_COMMUNITY): Payer: Self-pay

## 2023-12-23 NOTE — Anesthesia Preprocedure Evaluation (Addendum)
 Anesthesia Evaluation  Patient identified by MRN, date of birth, ID band Patient awake    Reviewed: Allergy & Precautions, NPO status , Patient's Chart, lab work & pertinent test results  Airway Mallampati: I  TM Distance: >3 FB Neck ROM: Full    Dental  (+) Dental Advisory Given, Missing, Chipped, Poor Dentition   Pulmonary COPD,  COPD inhaler, Current SmokerPatient did not abstain from smoking.   Pulmonary exam normal breath sounds clear to auscultation       Cardiovascular hypertension, Pt. on medications + angina  + CAD and + Cardiac Stents  Normal cardiovascular exam Rhythm:Regular Rate:Normal  TTE 2024   Findings are consistent with no ischemia. The study is low risk.   No ST deviation was noted.   LV perfusion is abnormal. Defect 1: There is a medium defect with mild reduction in uptake present in the apical to basal inferior location(s) that is fixed. There is normal wall motion in the defect area. Consistent with artifact.   Left ventricular function is abnormal. Global function is mildly reduced. Nuclear stress EF: 52 %. The left ventricular ejection fraction is mildly decreased (45-54%). End diastolic cavity size is normal. End systolic cavity size is normal.   Prior study not available for comparison.    Neuro/Psych negative neurological ROS  negative psych ROS   GI/Hepatic negative GI ROS,,,(+)     substance abuse    Endo/Other  negative endocrine ROS    Renal/GU negative Renal ROS  negative genitourinary   Musculoskeletal negative musculoskeletal ROS (+)  narcotic dependent  Abdominal   Peds  Hematology  (+) Blood dyscrasia (plavix)   Anesthesia Other Findings   Reproductive/Obstetrics                             Anesthesia Physical Anesthesia Plan  ASA: 3  Anesthesia Plan: General and Regional   Post-op Pain Management: Regional block* and Tylenol PO (pre-op)*    Induction: Intravenous  PONV Risk Score and Plan: 1 and Ondansetron, Dexamethasone and Midazolam  Airway Management Planned: LMA  Additional Equipment:   Intra-op Plan:   Post-operative Plan: Extubation in OR  Informed Consent: I have reviewed the patients History and Physical, chart, labs and discussed the procedure including the risks, benefits and alternatives for the proposed anesthesia with the patient or authorized representative who has indicated his/her understanding and acceptance.     Dental advisory given  Plan Discussed with: CRNA  Anesthesia Plan Comments: (PAT note written 12/23/2023 by Shonna Chock, PA-C.  )       Anesthesia Quick Evaluation

## 2023-12-23 NOTE — Progress Notes (Signed)
 Anesthesia Chart Review:  Case: [id number] Date/Time: 12/27/23 1500   Procedure: OPEN RIGHT INGUINAL HERNIA REPAIR WITH MESH (Right)   Anesthesia type: General   Pre-op diagnosis: RIGHT INGUINAL HERNIA   [facility] / [facility]   Surgeons: Emelia Loron, MD       DISCUSSION: Patient is a 68 year old male scheduled for the above procedure.  History includes smoking, COPD, hypertension, CAD (DES mid LAD 07/08/22 for unstable angina), spinal surgery (L4-5 PLIF 04/29/07), hernia (left IHR 03/21/01).  Preoperative cardiology input outlined by Gwendolyn Lima, NP on 12/09/23.  His last stress test was on 12/23/22 after he reported chest tightness. Results showed inferior wall perfusion abnormality that improved slightly with stress and felt most consistent with artifact, no ischemic, EF 45-54%. He denied chest pain and SOB at pre-operative evaluation. APP wrote, "Given past medical history and time since last visit, based on ACC/AHA guidelines, Daniel Hurley would be at acceptable risk for the planned procedure without further cardiovascular testing.    His RCRI is low risk, 0.9% risk of major cardiac event.  He is able to complete greater than 4 METS of physical activity.   Per [name] patient can hold Plavix 5 days prior to procedure and should continue aspirin through the perioperative period....": Although Plavix instructions to hold for only 5 days he reported last dose 12/01/2023.  He reported instructions to continue aspirin per cardiology  Preoperative EKG and labs acceptable for OR. Anesthesia team to evaluate on the day of surgery.    VS: BP (!) 141/92   Pulse 76   Temp 37.1 C   Resp 18   Ht 5\' 8"  (1.727 m)   Wt 81.6 kg   SpO2 97%   BMI 27.37 kg/m    PROVIDERS: Hilts, Casimiro Needle, MD is PCP  Charlton Haws, MD is cardiologist   LABS: Labs reviewed: Acceptable for surgery. (all labs ordered are listed, but only abnormal results are displayed)  Labs Reviewed  BASIC  METABOLIC PANEL - Abnormal; Notable for the following components:      Result Value   Glucose, Bld 100 (*)    All other components within normal limits  CBC     IMAGES: MRI Right knee 11/19/23: IMPRESSION: 1. Complex tear of the posterior horn medial meniscus and radial tear of the body of the medial meniscus. 2. High-grade partial-thickness cartilage loss of the medial patellar facet with mild subchondral marrow edema. Mild partial-thickness cartilage loss of the medial trochlea. 3. High-grade partial-thickness cartilage loss of the weight-bearing medial femorotibial compartment. 4. Large joint effusion.  CT Chest LCS 05/04/23: IMPRESSION: 1. Lung-RADS 2, benign appearance or behavior. Continue annual screening with low-dose chest CT without contrast in 12 months. 2. Coronary artery calcifications. 3. Aortic Atherosclerosis (ICD10-I70.0) and Emphysema (ICD10-J43.9).   EKG: 12/22/23: NSR   CV: Nuclear stress test 12/23/22:   Findings are consistent with no ischemia. The study is low risk.   No ST deviation was noted.   LV perfusion is abnormal. Defect 1: There is a medium defect with mild reduction in uptake present in the apical to basal inferior location(s) that is fixed. There is normal wall motion in the defect area. Consistent with artifact.   Left ventricular function is abnormal. Global function is mildly reduced. Nuclear stress EF: 52 %. The left ventricular ejection fraction is mildly decreased (45-54%). End diastolic cavity size is normal. End systolic cavity size is normal.   Prior study not available for comparison.  Inferior wall with decreased uptake at rest that improves slightly with stress, most consistent with artifact. No ischemia noted. Mildly reduced LVEF without focal wall motion abnormalities.   Cardiac cath 07/08/22:   Prox RCA lesion is 20% stenosed.   Ost Cx to Prox Cx lesion is 40% stenosed.   Ramus lesion is 40% stenosed.   Prox LAD to Mid LAD  lesion is 70% stenosed.   A drug-eluting stent was successfully placed using a SYNERGY XD [id number].   Post intervention, there is a 0% residual stenosis.   Severe mid LAD stenosis. RFR 0.84.  Mild non-obstructive disease in the ostial Circumflex and intermediate branch.  Mild non-obstructive disease in the mid RCA Successful PTCA/DES x 1 mid LAD   Recommendations: Continue DAPT with ASA and Plavix for at least six months. Continue beta blocker and statin.    Past Medical History:  Diagnosis Date   COPD (chronic obstructive pulmonary disease) (HCC)    Coronary artery disease    Hypertension 02/12/2020    Past Surgical History:  Procedure Laterality Date   ANKLE FUSION  2006   BACK SURGERY  2008   L5-5 fusion    CORONARY PRESSURE/FFR STUDY N/A 07/08/2022   Procedure: INTRAVASCULAR PRESSURE WIRE/FFR STUDY;  Surgeon: Kathleene Hazel, MD;  [facility];  Service: Cardiovascular;  Laterality: N/A;   CORONARY STENT INTERVENTION N/A 07/08/2022   Procedure: CORONARY STENT INTERVENTION;  Surgeon: Kathleene Hazel, MD;  Location: [facility];  Service: Cardiovascular;  Laterality: N/A;   LEFT HEART CATH AND CORONARY ANGIOGRAPHY N/A 07/08/2022   Procedure: LEFT HEART CATH AND CORONARY ANGIOGRAPHY;  Surgeon: Kathleene Hazel, MD;  Location: [facility];  Service: Cardiovascular;  Laterality: N/A;   left wrist surgery      MEDICATIONS:  amLODipine (NORVASC) 5 MG tablet   aspirin EC 81 MG tablet   baclofen (LIORESAL) 10 MG tablet   clopidogrel (PLAVIX) 75 MG tablet   Ferrous Sulfate Dried (FERROUS SULFATE CR PO)   Fluticasone-Umeclidin-Vilant (TRELEGY ELLIPTA) 200-62.5-25 MCG/ACT AEPB   HYDROcodone-acetaminophen (NORCO) 7.5-325 MG tablet   nitroGLYCERIN (NITROSTAT) 0.4 MG SL tablet   rosuvastatin (CRESTOR) 40 MG tablet   sildenafil (REVATIO) 20 MG tablet   No current facility-administered medications for this encounter.    Shonna Chock, PA-C Surgical Short Stay/Anesthesiology [facility] Phone [phone] [facility] Phone [phone] 12/23/2023 1:56 PM

## 2023-12-26 NOTE — Progress Notes (Unsigned)
  Cardiology Office Note    Patient Name: Daniel Hurley Date of Encounter: 12/26/2023  Primary Care Provider:  Lavada Mesi, MD Primary Cardiologist:  Charlton Haws, MD Primary Electrophysiologist: None   Past Medical History    Past Medical History:  Diagnosis Date   COPD (chronic obstructive pulmonary disease) (HCC)    Coronary artery disease    Hypertension 02/12/2020    History of Present Illness  Daniel Hurley is a 68 y.o. male with a PMH of***   During today's visit the patient reports*** .  Patient denies chest pain, palpitations, dyspnea, PND, orthopnea, nausea, vomiting, dizziness, syncope, edema, weight gain, or early satiety.  ***Notes: -Last ischemic evaluation: -Last echo: -Interim ED visits: Review of Systems  Please see the history of present illness.    All other systems reviewed and are otherwise negative except as noted above.  Physical Exam    Wt Readings from Last 3 Encounters:  12/22/23 180 lb (81.6 kg)  12/23/22 185 lb (83.9 kg)  12/18/22 185 lb (83.9 kg)   WG:NFAOZ were no vitals filed for this visit.,There is no height or weight on file to calculate BMI. GEN: Well nourished, well developed in no acute distress Neck: No JVD; No carotid bruits Pulmonary: Clear to auscultation without rales, wheezing or rhonchi  Cardiovascular: Normal rate. Regular rhythm. Normal S1. Normal S2.   Murmurs: There is no murmur.  ABDOMEN: Soft, non-tender, non-distended EXTREMITIES:  No edema; No deformity   EKG/LABS/ Recent Cardiac Studies   ECG personally reviewed by me today - ***  Risk Assessment/Calculations:   {Does this patient have ATRIAL FIBRILLATION?[phone]}      Lab Results  Component Value Date   WBC 8.3 12/22/2023   HGB 16.5 12/22/2023   HCT 49.8 12/22/2023   MCV 92.7 12/22/2023   PLT 207 12/22/2023   Lab Results  Component Value Date   CREATININE 1.11 12/22/2023   BUN 11 12/22/2023   NA 139 12/22/2023   K 3.9 12/22/2023   CL  103 12/22/2023   CO2 27 12/22/2023   Lab Results  Component Value Date   CHOL 140 07/06/2022   HDL 40 07/06/2022   LDLCALC 84 07/06/2022   TRIG 80 07/06/2022   CHOLHDL 3.5 07/06/2022    Lab Results  Component Value Date   HGBA1C 6.0 (H) 10/22/2020   Assessment & Plan    1.***  2.***  3.***  4.***      Disposition: Follow-up with Charlton Haws, MD or APP in *** months {Are you ordering a CV Procedure (e.g. stress test, cath, DCCV, TEE, etc)?   Press F2        :[id number]}   Signed, [name], Leodis Rains, NP 12/26/2023, 3:51 PM [facility]

## 2023-12-27 ENCOUNTER — Ambulatory Visit (HOSPITAL_COMMUNITY): Payer: Medicare Other | Admitting: Anesthesiology

## 2023-12-27 ENCOUNTER — Telehealth: Payer: Self-pay | Admitting: *Deleted

## 2023-12-27 ENCOUNTER — Ambulatory Visit (HOSPITAL_COMMUNITY)
Admission: RE | Admit: 2023-12-27 | Discharge: 2023-12-27 | Disposition: A | Discharge status: Home / Self Care | Payer: Medicare Other | Attending: General Surgery | Admitting: General Surgery

## 2023-12-27 ENCOUNTER — Other Ambulatory Visit (HOSPITAL_COMMUNITY): Payer: Self-pay

## 2023-12-27 ENCOUNTER — Ambulatory Visit: Payer: Medicare Other | Attending: Nurse Practitioner | Admitting: Nurse Practitioner

## 2023-12-27 ENCOUNTER — Ambulatory Visit (HOSPITAL_COMMUNITY): Payer: Medicare Other | Admitting: Vascular Surgery

## 2023-12-27 ENCOUNTER — Encounter (HOSPITAL_COMMUNITY): Payer: Self-pay | Admitting: General Surgery

## 2023-12-27 ENCOUNTER — Encounter: Payer: Self-pay | Admitting: Nurse Practitioner

## 2023-12-27 ENCOUNTER — Encounter (HOSPITAL_COMMUNITY)
Admission: RE | Disposition: A | Discharge status: Home / Self Care | Payer: Self-pay | Source: Home / Self Care | Attending: General Surgery

## 2023-12-27 ENCOUNTER — Other Ambulatory Visit: Payer: Self-pay

## 2023-12-27 VITALS — BP 122/78 | HR 74 | Ht 68.0 in | Wt 182.2 lb

## 2023-12-27 DIAGNOSIS — I25110 Atherosclerotic heart disease of native coronary artery with unstable angina pectoris: Secondary | ICD-10-CM | POA: Diagnosis not present

## 2023-12-27 DIAGNOSIS — I10 Essential (primary) hypertension: Secondary | ICD-10-CM | POA: Insufficient documentation

## 2023-12-27 DIAGNOSIS — Z79899 Other long term (current) drug therapy: Secondary | ICD-10-CM | POA: Diagnosis not present

## 2023-12-27 DIAGNOSIS — Z8249 Family history of ischemic heart disease and other diseases of the circulatory system: Secondary | ICD-10-CM | POA: Diagnosis not present

## 2023-12-27 DIAGNOSIS — F1120 Opioid dependence, uncomplicated: Secondary | ICD-10-CM | POA: Insufficient documentation

## 2023-12-27 DIAGNOSIS — Z955 Presence of coronary angioplasty implant and graft: Secondary | ICD-10-CM | POA: Diagnosis not present

## 2023-12-27 DIAGNOSIS — E782 Mixed hyperlipidemia: Secondary | ICD-10-CM | POA: Diagnosis not present

## 2023-12-27 DIAGNOSIS — Z7902 Long term (current) use of antithrombotics/antiplatelets: Secondary | ICD-10-CM | POA: Insufficient documentation

## 2023-12-27 DIAGNOSIS — I2510 Atherosclerotic heart disease of native coronary artery without angina pectoris: Secondary | ICD-10-CM | POA: Insufficient documentation

## 2023-12-27 DIAGNOSIS — Z01810 Encounter for preprocedural cardiovascular examination: Secondary | ICD-10-CM | POA: Diagnosis not present

## 2023-12-27 DIAGNOSIS — J449 Chronic obstructive pulmonary disease, unspecified: Secondary | ICD-10-CM | POA: Diagnosis not present

## 2023-12-27 DIAGNOSIS — K4090 Unilateral inguinal hernia, without obstruction or gangrene, not specified as recurrent: Secondary | ICD-10-CM | POA: Insufficient documentation

## 2023-12-27 DIAGNOSIS — F17210 Nicotine dependence, cigarettes, uncomplicated: Secondary | ICD-10-CM

## 2023-12-27 DIAGNOSIS — Z720 Tobacco use: Secondary | ICD-10-CM

## 2023-12-27 HISTORY — PX: INGUINAL HERNIA REPAIR: SHX194

## 2023-12-27 SURGERY — REPAIR, HERNIA, INGUINAL, ADULT
Anesthesia: Regional | Laterality: Right

## 2023-12-27 MED ORDER — CHLORHEXIDINE GLUCONATE 0.12 % MT SOLN
OROMUCOSAL | Status: AC
  Administered 2023-12-27: 15 mL via OROMUCOSAL
  Filled 2023-12-27: qty 15

## 2023-12-27 MED ORDER — ENSURE PRE-SURGERY PO LIQD: 296.0000 mL | Freq: Once | ORAL | Status: DC

## 2023-12-27 MED ORDER — FENTANYL CITRATE (PF) 250 MCG/5ML IJ SOLN
INTRAMUSCULAR | Status: AC
  Filled 2023-12-27: qty 5

## 2023-12-27 MED ORDER — LIDOCAINE 2% (20 MG/ML) 5 ML SYRINGE
INTRAMUSCULAR | Status: DC | PRN
  Administered 2023-12-27: 80 mg via INTRAVENOUS

## 2023-12-27 MED ORDER — DEXAMETHASONE SODIUM PHOSPHATE 10 MG/ML IJ SOLN
INTRAMUSCULAR | Status: DC | PRN
  Administered 2023-12-27: 5 mg via INTRAVENOUS

## 2023-12-27 MED ORDER — MIDAZOLAM HCL 2 MG/2ML IJ SOLN
INTRAMUSCULAR | Status: AC
  Filled 2023-12-27: qty 2

## 2023-12-27 MED ORDER — LACTATED RINGERS IV SOLN: INTRAVENOUS | Status: DC

## 2023-12-27 MED ORDER — DEXAMETHASONE SODIUM PHOSPHATE 10 MG/ML IJ SOLN
INTRAMUSCULAR | Status: DC | PRN
  Administered 2023-12-27: 10 mg

## 2023-12-27 MED ORDER — CHLORHEXIDINE GLUCONATE CLOTH 2 % EX PADS: 6.0000 | MEDICATED_PAD | Freq: Once | CUTANEOUS | Status: DC

## 2023-12-27 MED ORDER — BUPIVACAINE HCL (PF) 0.25 % IJ SOLN
INTRAMUSCULAR | Status: DC | PRN
  Administered 2023-12-27: 3 mL

## 2023-12-27 MED ORDER — PROPOFOL 10 MG/ML IV BOLUS
INTRAVENOUS | Status: AC
  Filled 2023-12-27: qty 20

## 2023-12-27 MED ORDER — ORAL CARE MOUTH RINSE: 15.0000 mL | Freq: Once | OROMUCOSAL | Status: AC

## 2023-12-27 MED ORDER — ACETAMINOPHEN 500 MG PO TABS
ORAL_TABLET | ORAL | Status: AC
  Administered 2023-12-27: 1000 mg via ORAL
  Filled 2023-12-27: qty 2

## 2023-12-27 MED ORDER — ROCURONIUM BROMIDE 10 MG/ML (PF) SYRINGE
PREFILLED_SYRINGE | INTRAVENOUS | Status: DC | PRN
  Administered 2023-12-27: 20 mg via INTRAVENOUS

## 2023-12-27 MED ORDER — OXYCODONE HCL 5 MG PO TABS
5.0000 mg | ORAL_TABLET | ORAL | Status: DC | PRN
  Administered 2023-12-27: 5 mg via ORAL

## 2023-12-27 MED ORDER — PROPOFOL 10 MG/ML IV BOLUS
INTRAVENOUS | Status: DC | PRN
  Administered 2023-12-27: 150 mg via INTRAVENOUS

## 2023-12-27 MED ORDER — CEFAZOLIN SODIUM-DEXTROSE 2-4 GM/100ML-% IV SOLN
2.0000 g | INTRAVENOUS | Status: AC
  Administered 2023-12-27: 2 g via INTRAVENOUS

## 2023-12-27 MED ORDER — SUGAMMADEX SODIUM 200 MG/2ML IV SOLN
INTRAVENOUS | Status: DC | PRN
  Administered 2023-12-27: 200 mg via INTRAVENOUS

## 2023-12-27 MED ORDER — SUCCINYLCHOLINE CHLORIDE 200 MG/10ML IV SOSY
PREFILLED_SYRINGE | INTRAVENOUS | Status: DC | PRN
  Administered 2023-12-27: 140 mg via INTRAVENOUS

## 2023-12-27 MED ORDER — ACETAMINOPHEN 325 MG PO TABS: 650.0000 mg | ORAL_TABLET | ORAL | Status: DC | PRN

## 2023-12-27 MED ORDER — ACETAMINOPHEN 500 MG PO TABS: 1000.0000 mg | ORAL_TABLET | Freq: Once | ORAL | Status: AC

## 2023-12-27 MED ORDER — SODIUM CHLORIDE 0.9 % IV SOLN: 250.0000 mL | INTRAVENOUS | Status: DC | PRN

## 2023-12-27 MED ORDER — FENTANYL CITRATE (PF) 250 MCG/5ML IJ SOLN
INTRAMUSCULAR | Status: DC | PRN
  Administered 2023-12-27 (×2): 50 ug via INTRAVENOUS

## 2023-12-27 MED ORDER — BUPIVACAINE HCL (PF) 0.25 % IJ SOLN
INTRAMUSCULAR | Status: AC
  Filled 2023-12-27: qty 30

## 2023-12-27 MED ORDER — CEFAZOLIN SODIUM-DEXTROSE 2-4 GM/100ML-% IV SOLN
INTRAVENOUS | Status: AC
  Filled 2023-12-27: qty 100

## 2023-12-27 MED ORDER — OXYCODONE HCL 5 MG PO TABS
5.0000 mg | ORAL_TABLET | Freq: Four times a day (QID) | ORAL | 0 refills | Status: DC | PRN
  Filled 2023-12-27: qty 10, 3d supply, fill #0

## 2023-12-27 MED ORDER — SODIUM CHLORIDE 0.9% FLUSH: 3.0000 mL | INTRAVENOUS | Status: DC | PRN

## 2023-12-27 MED ORDER — FENTANYL CITRATE (PF) 100 MCG/2ML IJ SOLN: 25.0000 ug | INTRAMUSCULAR | Status: DC | PRN

## 2023-12-27 MED ORDER — SODIUM CHLORIDE 0.9% FLUSH: 3.0000 mL | Freq: Two times a day (BID) | INTRAVENOUS | Status: DC

## 2023-12-27 MED ORDER — OXYCODONE HCL 5 MG PO TABS
ORAL_TABLET | ORAL | Status: DC
Start: 2023-12-27 — End: 2023-12-28
  Filled 2023-12-27: qty 1

## 2023-12-27 MED ORDER — ROPIVACAINE HCL 5 MG/ML IJ SOLN
INTRAMUSCULAR | Status: DC | PRN
  Administered 2023-12-27: 30 mL via PERINEURAL

## 2023-12-27 MED ORDER — CHLORHEXIDINE GLUCONATE 0.12 % MT SOLN: 15.0000 mL | Freq: Once | OROMUCOSAL | Status: AC

## 2023-12-27 MED ORDER — MIDAZOLAM HCL 2 MG/2ML IJ SOLN
INTRAMUSCULAR | Status: DC | PRN
  Administered 2023-12-27 (×2): 1 mg via INTRAVENOUS

## 2023-12-27 MED ORDER — ONDANSETRON HCL 4 MG/2ML IJ SOLN
INTRAMUSCULAR | Status: DC | PRN
  Administered 2023-12-27: 4 mg via INTRAVENOUS

## 2023-12-27 MED ORDER — FENTANYL CITRATE (PF) 100 MCG/2ML IJ SOLN
INTRAMUSCULAR | Status: DC
Start: 2023-12-27 — End: 2023-12-27
  Filled 2023-12-27: qty 2

## 2023-12-27 MED ORDER — PHENYLEPHRINE 80 MCG/ML (10ML) SYRINGE FOR IV PUSH (FOR BLOOD PRESSURE SUPPORT)
PREFILLED_SYRINGE | INTRAVENOUS | Status: DC | PRN
  Administered 2023-12-27: 80 ug via INTRAVENOUS

## 2023-12-27 MED ORDER — 0.9 % SODIUM CHLORIDE (POUR BTL) OPTIME
TOPICAL | Status: DC | PRN
  Administered 2023-12-27: 1000 mL

## 2023-12-27 MED ORDER — ACETAMINOPHEN 650 MG RE SUPP: 650.0000 mg | RECTAL | Status: DC | PRN

## 2023-12-27 MED ORDER — ACETAMINOPHEN 500 MG PO TABS
1000.0000 mg | ORAL_TABLET | ORAL | Status: DC
Start: 2023-12-27 — End: 2023-12-27

## 2023-12-27 SURGICAL SUPPLY — 35 items
BAG COUNTER SPONGE SURGICOUNT (BAG) ×2 IMPLANT
BLADE CLIPPER SURG (BLADE) IMPLANT
CANISTER SUCT 3000ML PPV (MISCELLANEOUS) IMPLANT
CHLORAPREP W/TINT 26 (MISCELLANEOUS) ×2 IMPLANT
COVER SURGICAL LIGHT HANDLE (MISCELLANEOUS) ×2 IMPLANT
DERMABOND ADVANCED .7 DNX12 (GAUZE/BANDAGES/DRESSINGS) ×2 IMPLANT
DRAIN PENROSE .5X12 LATEX STL (DRAIN) IMPLANT
DRAPE LAPAROTOMY TRNSV 102X78 (DRAPES) ×2 IMPLANT
ELECT CAUTERY BLADE 6.4 (BLADE) ×2 IMPLANT
ELECT REM PT RETURN 9FT ADLT (ELECTROSURGICAL) ×1 IMPLANT
ELECTRODE REM PT RTRN 9FT ADLT (ELECTROSURGICAL) ×2 IMPLANT
GAUZE 4X4 16PLY ~~LOC~~+RFID DBL (SPONGE) ×2 IMPLANT
GLOVE BIO SURGEON STRL SZ7 (GLOVE) ×2 IMPLANT
GLOVE BIOGEL PI IND STRL 7.5 (GLOVE) ×2 IMPLANT
GOWN STRL REUS W/ TWL LRG LVL3 (GOWN DISPOSABLE) ×4 IMPLANT
KIT BASIN OR (CUSTOM PROCEDURE TRAY) ×2 IMPLANT
KIT TURNOVER KIT B (KITS) ×2 IMPLANT
MARKER SKIN DUAL TIP RULER LAB (MISCELLANEOUS) ×2 IMPLANT
MESH ULTRAPRO 3X6 7.6X15CM (Mesh General) IMPLANT
NDL HYPO 25GX1X1/2 BEV (NEEDLE) ×2 IMPLANT
NEEDLE HYPO 25GX1X1/2 BEV (NEEDLE) ×1 IMPLANT
NS IRRIG 1000ML POUR BTL (IV SOLUTION) ×2 IMPLANT
PACK GENERAL/GYN (CUSTOM PROCEDURE TRAY) ×2 IMPLANT
PAD ARMBOARD 7.5X6 YLW CONV (MISCELLANEOUS) ×2 IMPLANT
PENCIL SMOKE EVACUATOR (MISCELLANEOUS) ×2 IMPLANT
SPIKE FLUID TRANSFER (MISCELLANEOUS) IMPLANT
STRIP CLOSURE SKIN 1/2X4 (GAUZE/BANDAGES/DRESSINGS) IMPLANT
SUT MNCRL AB 4-0 PS2 18 (SUTURE) ×2 IMPLANT
SUT VIC AB 2-0 CT1 TAPERPNT 27 (SUTURE) ×2 IMPLANT
SUT VIC AB 2-0 SH 18 (SUTURE) ×2 IMPLANT
SUT VIC AB 3-0 SH 27XBRD (SUTURE) ×2 IMPLANT
SUT VICRYL AB 2 0 TIES (SUTURE) ×2 IMPLANT
SYR CONTROL 10ML LL (SYRINGE) ×2 IMPLANT
TOWEL GREEN STERILE (TOWEL DISPOSABLE) ×2 IMPLANT
TOWEL GREEN STERILE FF (TOWEL DISPOSABLE) ×2 IMPLANT

## 2023-12-27 NOTE — Transfer of Care (Signed)
 Immediate Anesthesia Transfer of Care Note  Patient: Daniel Hurley  Procedure(s) Performed: OPEN RIGHT INGUINAL HERNIA REPAIR WITH MESH (Right)  Patient Location: PACU  Anesthesia Type:General  Level of Consciousness: drowsy and responds to stimulation  Airway & Oxygen Therapy: Patient Spontanous Breathing  Post-op Assessment: Report given to RN and Post -op Vital signs reviewed and stable  Post vital signs: Reviewed and stable  Last Vitals:  Vitals Value Taken Time  BP 125/94 12/27/23 1525  Temp    Pulse 63 12/27/23 1527  Resp 23 12/27/23 1527  SpO2 95 % 12/27/23 1527  Vitals shown include unfiled device data.  Last Pain:  Vitals:   12/27/23 1333  TempSrc:   PainSc: 0-No pain      Patients Stated Pain Goal: 0 (12/27/23 1333)  Complications: No notable events documented.

## 2023-12-27 NOTE — H&P (Signed)
  68 year old male who has a history of coronary artery disease and COPD. He is on Plavix. He is fairly active. He has had a prior right inguinal hernia repair that he is not sure exactly how this is fixed. Over the past several weeks he has noticed a right groin bulge. This is mostly out. This causes some discomfort. He was seen by his primary care physician Dr. Prince Rome and referred.  Review of Systems: A complete review of systems was obtained from the patient. I have reviewed this information and discussed as appropriate with the patient. See HPI as well for other ROS.  Review of Systems  All other systems reviewed and are negative.   Medical History: Past Medical History:  Diagnosis Date  Arthritis  COPD (chronic obstructive pulmonary disease) (CMS/HHS-HCC)  Hypertension   There is no problem list on file for this patient.  Past Surgical History:  Procedure Laterality Date  cardiac cath  POSTERIOR LUMBAR SPINE FUSION ONE LEVEL    No Known Allergies  Current Outpatient Medications on File Prior to Visit  Medication Sig Dispense Refill  amLODIPine (NORVASC) 5 MG tablet Take 5 mg by mouth once daily  clopidogreL (PLAVIX) 75 mg tablet Take 75 mg by mouth once daily  HYDROcodone-acetaminophen (NORCO) 7.5-325 mg tablet Take 1 tablet by mouth every 6 (six) hours as needed for Pain  lisinopriL (ZESTRIL) 10 MG tablet Take 10 mg by mouth once daily  rosuvastatin (CRESTOR) 40 MG tablet Take 40 mg by mouth once daily  sildenafil (REVATIO) 20 mg tablet Take 20 mg by mouth 3 (three) times daily   No current facility-administered medications on file prior to visit.   Family History  Problem Relation Age of Onset  High blood pressure (Hypertension) Mother    Social History   Tobacco Use  Smoking Status Every Day  Current packs/day: 1.00  Types: Cigarettes  Smokeless Tobacco Never  Marital status: Married  Tobacco Use  Smoking status: Every Day  Current packs/day: 1.00  Types:  Cigarettes  Smokeless tobacco: Never  Substance and Sexual Activity  Alcohol use: Yes  Drug use: Never   Objective:   Vitals:  11/30/23 1511  BP: 118/76  Pulse: 80  Temp: 36.9 C (98.5 F)  SpO2: 98%  Weight: 82.8 kg (182 lb 9.6 oz)  Height: 172.7 cm (5\' 8" )  PainSc: 0-No pain   Body mass index is 27.76 kg/m.  Physical Exam Vitals reviewed.  Constitutional:  Appearance: Normal appearance.  Abdominal:  Tenderness: There is no abdominal tenderness.  Hernia: A hernia is present. Hernia is present in the right inguinal area. Umbilical: tender, reducible. Comments: Cannot see scars, I think may have lap scars visible, do not remember how this was fixed before  Neurological:  Mental Status: He is alert.   Assessment and Plan:   Right inguinal hernia  Open right inguinal hernia repair   We discussed observation versus repair. We discussed both laparoscopic and open inguinal hernia repairs. I described the procedure in detail. The patient was given educational material. Goals should be achieved with surgery. We discussed the usage of mesh and the rationale behind that. We went over the pathophysiology of inguinal hernia. We have elected to perform open inguinal hernia repair with mesh. We discussed the risks including bleeding, infection, recurrence, postoperative pain and chronic groin pain, testicular injury, urinary retention, numbness in groin and around incision.

## 2023-12-27 NOTE — Anesthesia Procedure Notes (Signed)
 Anesthesia Regional Block: Quadratus lumborum   Pre-Anesthetic Checklist: , timeout performed,  Correct Patient, Correct Site, Correct Laterality,  Correct Procedure, Correct Position, site marked,  Risks and benefits discussed,  Surgical consent,  Pre-op evaluation,  At surgeon's request and post-op pain management  Laterality: Right  Prep: Maximum Sterile Barrier Precautions used, chloraprep       Needles:  Injection technique: Single-shot  Needle Type: Echogenic Stimulator Needle     Needle Length: 9cm  Needle Gauge: 22     Additional Needles:   Procedures:,,,, ultrasound used (permanent image in chart),,    Narrative:  Start time: 12/27/2023 1:50 PM End time: 12/27/2023 1:55 PM Injection made incrementally with aspirations every 5 mL.  Performed by: Personally  Anesthesiologist: Elmer Picker, MD  Additional Notes: Monitors applied. No increased pain on injection. No increased resistance to injection. Injection made in 5cc increments. Good needle visualization. Patient tolerated procedure well.

## 2023-12-27 NOTE — Op Note (Signed)
 Preoperative diagnosis: Right inguinal hernia Postoperative diagnosis: Indirect right inguinal hernia Procedure: Right inguinal hernia repair with ultra Pro mesh Surgeon: Dr. Harden Mo Anesthesia: General With a tap block Specimens: None Drains: None Estimated blood loss: Minimal Complications: None Sponge and needle count was correct x 2 at end of operation Disposition recovery stable condition  Indications: 68 year old male with a history of coronary artery disease and COPD on Plavix.  He remains active.  He has had a prior hernia repair on the left side.  He initially told me this was the right but it became clear this was the left today.  He has had a right groin bulge and hernia on exam.  We discussed proceeding with hernia repair.  Procedure: After informed consent was obtained the patient was taken to the operating room.  He had undergone a tap block prior to beginning.  He was given antibiotics.  SCDs were in place.  He was placed under general anesthesia without complication.  He was then prepped and draped in a standard sterile surgical fashion.  A surgical timeout was then performed.  I infiltrated some Marcaine and made a right groin incision.  I carried this through the subcutaneous tissue.  I then was able to identify the external oblique.  I incised this through the external ring.  I then encircled the spermatic cord with a Penrose drain.  He did not have a direct hernia although the floor was weak.  He did have a fairly decent size indirect hernia which I dissected free from the rest of the cord structures which were preserved.  I decided to just place this back in the abdomen.  I tightened his ring with a 2-0 Vicryl suture.  This completely obliterated the defect.  I then used an ultra Pro mesh patch and placed this over the entire floor.  I sutured this into position with 2-0 Vicryl to the pubic tubercle x 2.  I then sutured it every half centimeter to the inguinal ligament  inferiorly.  I made a cut and wrapped it around the mesh.  I secured this with 2-0 Vicryl suture superiorly as well.  I laid the lateral portion flat under the external oblique.  This completely obliterated the defect.  I then obtained hemostasis.  I closed the external oblique with 2-0 Vicryl.  Scarpa's fascia was closed with 3-0 Vicryl and the skin with 4-0 Monocryl.  Glue and Steri-Strips were applied.  He tolerated this well was extubated and transferred to recovery stable.

## 2023-12-27 NOTE — Telephone Encounter (Signed)
   Pre-operative Risk Assessment    Patient Name: Daniel Hurley  DOB: [birth year] MRN: [id number]   Date of last office visit: 12/27/23 Robin Searing, NP  Date of next office visit: NONE   Request for Surgical Clearance    Procedure:  RIGHT TOTAL KNEE   Date of Surgery:  Clearance TBD                                Surgeon:  DR. DEAN Surgeon's Group or Practice Name:   Phone number:  [phone] ATTN: DEBBIE NARDI Fax number:  [phone]   Type of Clearance Requested:   - Medical  - Pharmacy:  Hold Aspirin     Type of Anesthesia:  Spinal   Additional requests/questions:    Elpidio Anis   12/27/2023, 10:21 AM

## 2023-12-27 NOTE — Anesthesia Procedure Notes (Signed)
 Procedure Name: Intubation Date/Time: 12/27/2023 2:32 PM  Performed by: Loleta Marai Teehan, CRNAPre-anesthesia Checklist: Patient identified, Patient being monitored, Timeout performed, Emergency Drugs available and Suction available Patient Re-evaluated:Patient Re-evaluated prior to induction Oxygen Delivery Method: Circle system utilized Preoxygenation: Pre-oxygenation with 100% oxygen Induction Type: IV induction Ventilation: Mask ventilation without difficulty Laryngoscope Size: Mac and 4 Grade View: Grade I Tube type: Oral Tube size: 7.5 mm Number of attempts: 1 Airway Equipment and Method: Stylet Placement Confirmation: ETT inserted through vocal cords under direct vision, positive ETCO2 and breath sounds checked- equal and bilateral Secured at: 23 cm Tube secured with: Tape Dental Injury: Teeth and Oropharynx as per pre-operative assessment

## 2023-12-27 NOTE — Telephone Encounter (Signed)
 Daphene Calamity CMA came to me about a preop clearance for Daniel Hurley who is here now seeing Robin Searing, NP. Pt was recently cleared for his hernia surgery. Pt today states he is to have knee surgery in 3 weeks.   I stated I did not have a clearance request for the knee surgery yet. I assured NP and CMA that I will work on this now.   I s/w Chrystine Oiler, surgery scheduler for Dr. August Saucer. Eunice Blase states she does not have the pt scheduled yet which is why she had not sent out the clearance request for the pt. I have obtained the needed information from [place] and have forwarded the request to Robin Searing, NP who is seeing the pt today.

## 2023-12-27 NOTE — Interval H&P Note (Signed)
 History and Physical Interval Note:  12/27/2023 2:05 PM  Daniel Hurley  has presented today for surgery, with the diagnosis of RIGHT INGUINAL HERNIA.  The various methods of treatment have been discussed with the patient and family. After consideration of risks, benefits and other options for treatment, the patient has consented to  Procedure(s): OPEN RIGHT INGUINAL HERNIA REPAIR WITH MESH (Right) as a surgical intervention.  The patient's history has been reviewed, patient examined, no change in status, stable for surgery.  I have reviewed the patient's chart and labs.  Questions were answered to the patient's satisfaction.     Emelia Loron

## 2023-12-27 NOTE — Discharge Instructions (Signed)
 [facility]  UMBILICAL OR INGUINAL HERNIA REPAIR: POST OP INSTRUCTIONS  Always review your discharge instruction sheet given to you by the facility where your surgery was performed. IF YOU HAVE DISABILITY OR FAMILY LEAVE FORMS, YOU MUST BRING THEM TO THE OFFICE FOR PROCESSING.   DO NOT GIVE THEM TO YOUR DOCTOR.  A  prescription for pain medication may be given to you upon discharge.  Take your pain medication as prescribed, if needed.  If narcotic pain medicine is not needed, then you may take acetaminophen (Tylenol), naprosyn (Alleve) or ibuprofen (Advil) as needed. Take your usually prescribed medications unless otherwise directed. If you need a refill on your pain medication, please contact your pharmacy.  They will contact our office to request authorization. Prescriptions will not be filled after 5 pm or on week-ends. You should follow a light diet the first 24 hours after arrival home, such as soup and crackers, etc.  Be sure to include lots of fluids daily.  Resume your normal diet the day after surgery. Most patients will experience some swelling and bruising around the umbilicus or in the groin and scrotum.  Ice packs and reclining will help.  Swelling and bruising can take several days to resolve.  It is common to experience some constipation if taking pain medication after surgery.  Increasing fluid intake and taking a stool softener (such as Colace) will usually help or prevent this problem from occurring.  A mild laxative (Milk of Magnesia or Miralax) should be taken according to package directions if there are no bowel movements after 48 hours. Unless discharge instructions indicate otherwise, you may remove your bandages 48 hours after surgery, and you may shower at that time.  You may have steri-strips (small skin tapes) in place directly over the incision.  These strips should be left on the skin for 7-10 days and will come off on their own.  If your surgeon used  skin glue on the incision, you may shower in 24 hours.  The glue will flake off over the next 2-3 weeks.  Any sutures or staples will be removed at the office during your follow-up visit. ACTIVITIES:  You may resume regular (light) daily activities beginning the next day--such as daily self-care, walking, climbing stairs--gradually increasing activities as tolerated.  You may have sexual intercourse when it is comfortable.  Refrain from any heavy lifting or straining until approved by your doctor. You may drive when you are no longer taking prescription pain medication, you can comfortably wear a seatbelt, and you can safely maneuver your car and apply brakes. RETURN TO WORK:  __________________________________________________________ Daniel Hurley should see your doctor in the office for a follow-up appointment approximately 2-3 weeks after your surgery.  Make sure that you call for this appointment within a day or two after you arrive home to insure a convenient appointment time. OTHER INSTRUCTIONS:  __________________________________________________________________________________________________________________________________________________________________________________________  WHEN TO CALL YOUR DOCTOR: Fever over 101.0 Inability to urinate Nausea and/or vomiting Extreme swelling or bruising Continued bleeding from incision. Increased pain, redness, or drainage from the incision  The clinic staff is available to answer your questions during regular business hours.  Please don't hesitate to call and ask to speak to one of the nurses for clinical concerns.  If you have a medical emergency, go to the nearest emergency room or call 911.  A surgeon from [facility] is always on call at the hospital   [address], [address], [place], [place]  [address] ?  [address] [address], [place], [place]   [address] [phone] ? [phone] ? FAX [phone] Web site:  [email or web address]

## 2023-12-27 NOTE — Patient Instructions (Signed)
 Medication Instructions:   Your physician recommends that you continue on your current medications as directed. Please refer to the Current Medication list given to you today.   *If you need a refill on your cardiac medications before your next appointment, please call your pharmacy*   Lab Work:  None ordered.  If you have labs (blood work) drawn today and your tests are completely normal, you will receive your results only by: MyChart Message (if you have MyChart) OR A paper copy in the mail If you have any lab test that is abnormal or we need to change your treatment, we will call you to review the results.   Testing/Procedures:  None ordered.   Follow-Up: At [facility], you and your health needs are our priority.  As part of our continuing mission to provide you with exceptional heart care, we have created designated Provider Care Teams.  These Care Teams include your primary Cardiologist (physician) and Advanced Practice Providers (APPs -  Physician Assistants and Nurse Practitioners) who all work together to provide you with the care you need, when you need it.  We recommend signing up for the patient portal called "MyChart".  Sign up information is provided on this After Visit Summary.  MyChart is used to connect with patients for Virtual Visits (Telemedicine).  Patients are able to view lab/test results, encounter notes, upcoming appointments, etc.  Non-urgent messages can be sent to your provider as well.   To learn more about what you can do with MyChart, go to [email or web address].    Your next appointment:   1 year(s)  Provider:   Charlton Haws, MD     Other Instructions  Your physician wants you to follow-up in: 1 year.  You will receive a reminder letter in the mail two months in advance. If you don't receive a letter, please call our office to schedule the follow-up appointment.  You can hold plavix for 5 days prior to your knee surgery.   Continue  Asprin.     1st Floor: - Lobby - Registration  - Pharmacy  - Lab - Cafe  2nd Floor: - PV Lab - Diagnostic Testing (echo, CT, nuclear med)  3rd Floor: - Vacant  4th Floor: - TCTS (cardiothoracic surgery) - AFib Clinic - Structural Heart Clinic - Vascular Surgery  - Vascular Ultrasound  5th Floor: - HeartCare Cardiology (general and EP) - Clinical Pharmacy for coumadin, hypertension, lipid, weight-loss medications, and med management appointments    Valet parking services will be available as well.

## 2023-12-28 ENCOUNTER — Encounter (HOSPITAL_COMMUNITY): Payer: Self-pay | Admitting: General Surgery

## 2023-12-28 NOTE — Anesthesia Postprocedure Evaluation (Signed)
 Anesthesia Post Note  Patient: Daniel Hurley  Procedure(s) Performed: OPEN RIGHT INGUINAL HERNIA REPAIR WITH MESH (Right)     Patient location during evaluation: PACU Anesthesia Type: Regional Level of consciousness: awake and alert Pain management: pain level controlled Vital Signs Assessment: post-procedure vital signs reviewed and stable Respiratory status: spontaneous breathing, nonlabored ventilation, respiratory function stable and patient connected to nasal cannula oxygen Cardiovascular status: blood pressure returned to baseline and stable Postop Assessment: no apparent nausea or vomiting Anesthetic complications: no   No notable events documented.  Last Vitals:  Vitals:   12/27/23 1545 12/27/23 1600  BP: (!) 145/89 (!) 142/94  Pulse: (!) 58 63  Resp: 10 16  Temp:  36.6 C  SpO2: 96% 97%    Last Pain:  Vitals:   12/27/23 1600  TempSrc:   PainSc: 3                  Shigeo Baugh

## 2023-12-31 ENCOUNTER — Other Ambulatory Visit: Payer: Self-pay

## 2024-01-12 ENCOUNTER — Encounter: Payer: Self-pay | Admitting: Orthopedic Surgery

## 2024-01-12 NOTE — Telephone Encounter (Signed)
 I advised, if he really wants to go home can we make him first case debbie at this point?

## 2024-01-18 NOTE — Pre-Procedure Instructions (Signed)
 Surgical Instructions   Your procedure is scheduled on January 27, 2024. Report to [facility] Main Entrance "A" at 5:30 A.M., then check in with the Admitting office. Any questions or running late day of surgery: call [phone]  Questions prior to your surgery date: call [phone], Monday-Friday, 8am-4pm. If you experience any cold or flu symptoms such as cough, fever, chills, shortness of breath, etc. between now and your scheduled surgery, please notify us at the above number.     Remember:  Do not eat after midnight the night before your surgery  You may drink clear liquids until 4:30 AM the morning of your surgery.   Clear liquids allowed are: Water, Non-Citrus Juices (without pulp), Carbonated Beverages, Clear Tea (no milk, honey, etc.), Black Coffee Only (NO MILK, CREAM OR POWDERED CREAMER of any kind), and Gatorade.  Patient Instructions  The night before surgery:  No food after midnight. ONLY clear liquids after midnight  The day of surgery (if you do NOT have diabetes):  Drink ONE (1) Pre-Surgery Clear Ensure by 4:30 AM the morning of surgery. Drink in one sitting. Do not sip.  This drink was given to you during your hospital  pre-op appointment visit.  Nothing else to drink after completing the  Pre-Surgery Clear Ensure.         If you have questions, please contact your surgeon's office.   Take these medicines the morning of surgery with A SIP OF WATER: amLODipine (NORVASC)  Fluticasone-Umeclidin-Vilant (TRELEGY ELLIPTA) inhaler  rosuvastatin (CRESTOR)    May take these medicines IF NEEDED: baclofen (LIORESAL)  HYDROcodone-acetaminophen (NORCO)  nitroGLYCERIN (NITROSTAT) - if dose taken prior to surgery, please call either of the above phone numbers oxyCODONE (OXY IR/ROXICODONE)    Follow your surgeon's instructions on when to stop Aspirin and clopidogrel (PLAVIX).  If no instructions were given by your surgeon then you will need to call the office to get  those instructions.     One week prior to surgery, STOP taking any Aleve, Naproxen, Ibuprofen, Motrin, Advil, Goody's, BC's, all herbal medications, fish oil, and non-prescription vitamins.                     Do NOT Smoke (Tobacco/Vaping) for 24 hours prior to your procedure.  If you use a CPAP at night, you may bring your mask/headgear for your overnight stay.   You will be asked to remove any contacts, glasses, piercing's, hearing aid's, dentures/partials prior to surgery. Please bring cases for these items if needed.    Patients discharged the day of surgery will not be allowed to drive home, and someone needs to stay with them for 24 hours.  SURGICAL WAITING ROOM VISITATION Patients may have no more than 2 support people in the waiting area - these visitors may rotate.   Pre-op nurse will coordinate an appropriate time for 1 ADULT support person, who may not rotate, to accompany patient in pre-op.  Children under the age of 13 must have an adult with them who is not the patient and must remain in the main waiting area with an adult.  If the patient needs to stay at the hospital during part of their recovery, the visitor guidelines for inpatient rooms apply.  Please refer to the [facility] website for the visitor guidelines for any additional information.   If you received a COVID test during your pre-op visit  it is requested that you wear a mask when out in public, stay away from  anyone that may not be feeling well and notify your surgeon if you develop symptoms. If you have been in contact with anyone that has tested positive in the last 10 days please notify you surgeon.      Pre-operative 5 CHG Bathing Instructions   You can play a key role in reducing the risk of infection after surgery. Your skin needs to be as free of germs as possible. You can reduce the number of germs on your skin by washing with CHG (chlorhexidine gluconate) soap before surgery. CHG is an antiseptic  soap that kills germs and continues to kill germs even after washing.   DO NOT use if you have an allergy to chlorhexidine/CHG or antibacterial soaps. If your skin becomes reddened or irritated, stop using the CHG and notify one of our RNs at [phone].   Please shower with the CHG soap starting 4 days before surgery using the following schedule:     Please keep in mind the following:  DO NOT shave, including legs and underarms, starting the day of your first shower.   You may shave your face at any point before/day of surgery.  Place clean sheets on your bed the day you start using CHG soap. Use a clean washcloth (not used since being washed) for each shower. DO NOT sleep with pets once you start using the CHG.   CHG Shower Instructions:  Wash your face and private area with normal soap. If you choose to wash your hair, wash first with your normal shampoo.  After you use shampoo/soap, rinse your hair and body thoroughly to remove shampoo/soap residue.  Turn the water OFF and apply about 3 tablespoons (45 ml) of CHG soap to a CLEAN washcloth.  Apply CHG soap ONLY FROM YOUR NECK DOWN TO YOUR TOES (washing for 3-5 minutes)  DO NOT use CHG soap on face, private areas, open wounds, or sores.  Pay special attention to the area where your surgery is being performed.  If you are having back surgery, having someone wash your back for you may be helpful. Wait 2 minutes after CHG soap is applied, then you may rinse off the CHG soap.  Pat dry with a clean towel  Put on clean clothes/pajamas   If you choose to wear lotion, please use ONLY the CHG-compatible lotions that are listed below.  Additional instructions for the day of surgery: DO NOT APPLY any lotions, deodorants, cologne, or perfumes.   Do not bring valuables to the hospital. [facility] is not responsible for any belongings/valuables. Do not wear nail polish, gel polish, artificial nails, or any other type of covering on natural  nails (fingers and toes) Do not wear jewelry or makeup Put on clean/comfortable clothes.  Please brush your teeth.  Ask your nurse before applying any prescription medications to the skin.     CHG Compatible Lotions   Aveeno Moisturizing lotion  Cetaphil Moisturizing Cream  Cetaphil Moisturizing Lotion  Clairol Herbal Essence Moisturizing Lotion, Dry Skin  Clairol Herbal Essence Moisturizing Lotion, Extra Dry Skin  Clairol Herbal Essence Moisturizing Lotion, Normal Skin  Curel Age Defying Therapeutic Moisturizing Lotion with Alpha Hydroxy  Curel Extreme Care Body Lotion  Curel Soothing Hands Moisturizing Hand Lotion  Curel Therapeutic Moisturizing Cream, Fragrance-Free  Curel Therapeutic Moisturizing Lotion, Fragrance-Free  Curel Therapeutic Moisturizing Lotion, Original Formula  Eucerin Daily Replenishing Lotion  Eucerin Dry Skin Therapy Plus Alpha Hydroxy Crme  Eucerin Dry Skin Therapy Plus Alpha Hydroxy Lotion  Eucerin Original Crme  Eucerin Original Lotion  Eucerin Plus Crme Eucerin Plus Lotion  Eucerin TriLipid Replenishing Lotion  Keri Anti-Bacterial Hand Lotion  Keri Deep Conditioning Original Lotion Dry Skin Formula Softly Scented  Keri Deep Conditioning Original Lotion, Fragrance Free Sensitive Skin Formula  Keri Lotion Fast Absorbing Fragrance Free Sensitive Skin Formula  Keri Lotion Fast Absorbing Softly Scented Dry Skin Formula  Keri Original Lotion  Keri Skin Renewal Lotion Keri Silky Smooth Lotion  Keri Silky Smooth Sensitive Skin Lotion  Nivea Body Creamy Conditioning Oil  Nivea Body Extra Enriched Lotion  Nivea Body Original Lotion  Nivea Body Sheer Moisturizing Lotion Nivea Crme  Nivea Skin Firming Lotion  NutraDerm 30 Skin Lotion  NutraDerm Skin Lotion  NutraDerm Therapeutic Skin Cream  NutraDerm Therapeutic Skin Lotion  ProShield Protective Hand Cream  Provon moisturizing lotion  Please read over the following fact sheets that you were  given.

## 2024-01-19 ENCOUNTER — Other Ambulatory Visit: Payer: Self-pay

## 2024-01-19 ENCOUNTER — Encounter (HOSPITAL_COMMUNITY)
Admission: RE | Admit: 2024-01-19 | Discharge: 2024-01-19 | Disposition: A | Discharge status: Home / Self Care | Source: Ambulatory Visit | Attending: Orthopedic Surgery | Admitting: Orthopedic Surgery

## 2024-01-19 ENCOUNTER — Encounter (HOSPITAL_COMMUNITY): Payer: Self-pay

## 2024-01-19 VITALS — BP 131/76 | HR 69 | Temp 97.8°F | Resp 17 | Ht 68.0 in | Wt 181.0 lb

## 2024-01-19 DIAGNOSIS — Z981 Arthrodesis status: Secondary | ICD-10-CM | POA: Diagnosis not present

## 2024-01-19 DIAGNOSIS — Z9889 Other specified postprocedural states: Secondary | ICD-10-CM | POA: Diagnosis not present

## 2024-01-19 DIAGNOSIS — J449 Chronic obstructive pulmonary disease, unspecified: Secondary | ICD-10-CM | POA: Insufficient documentation

## 2024-01-19 DIAGNOSIS — I2510 Atherosclerotic heart disease of native coronary artery without angina pectoris: Secondary | ICD-10-CM | POA: Insufficient documentation

## 2024-01-19 DIAGNOSIS — Z01812 Encounter for preprocedural laboratory examination: Secondary | ICD-10-CM | POA: Diagnosis not present

## 2024-01-19 DIAGNOSIS — I10 Essential (primary) hypertension: Secondary | ICD-10-CM | POA: Diagnosis not present

## 2024-01-19 DIAGNOSIS — F17200 Nicotine dependence, unspecified, uncomplicated: Secondary | ICD-10-CM | POA: Insufficient documentation

## 2024-01-19 DIAGNOSIS — M1711 Unilateral primary osteoarthritis, right knee: Secondary | ICD-10-CM | POA: Insufficient documentation

## 2024-01-19 DIAGNOSIS — Z955 Presence of coronary angioplasty implant and graft: Secondary | ICD-10-CM | POA: Insufficient documentation

## 2024-01-19 DIAGNOSIS — Z7982 Long term (current) use of aspirin: Secondary | ICD-10-CM | POA: Insufficient documentation

## 2024-01-19 DIAGNOSIS — Z01818 Encounter for other preprocedural examination: Secondary | ICD-10-CM

## 2024-01-19 DIAGNOSIS — Z7902 Long term (current) use of antithrombotics/antiplatelets: Secondary | ICD-10-CM | POA: Diagnosis not present

## 2024-01-19 LAB — URINALYSIS, W/ REFLEX TO CULTURE (INFECTION SUSPECTED)
Bacteria, UA: NONE SEEN
Bilirubin Urine: NEGATIVE
Glucose, UA: NEGATIVE mg/dL
Hgb urine dipstick: NEGATIVE
Ketones, ur: NEGATIVE mg/dL
Nitrite: NEGATIVE
Protein, ur: NEGATIVE mg/dL
Specific Gravity, Urine: 1.01 (ref 1.005–1.030)
pH: 7 (ref 5.0–8.0)

## 2024-01-19 LAB — BASIC METABOLIC PANEL
Anion gap: 9 (ref 5–15)
BUN: 12 mg/dL (ref 8–23)
CO2: 25 mmol/L (ref 22–32)
Calcium: 9.4 mg/dL (ref 8.9–10.3)
Chloride: 106 mmol/L (ref 98–111)
Creatinine, Ser: 1.07 mg/dL (ref 0.61–1.24)
GFR, Estimated: >60 mL/min (ref >60)
Glucose, Bld: 94 mg/dL (ref 70–99)
Potassium: 3.7 mmol/L (ref 3.5–5.1)
Sodium: 140 mmol/L (ref 135–145)

## 2024-01-19 LAB — CBC
HCT: 47.1 % (ref 39.0–52.0)
Hemoglobin: 15.4 g/dL (ref 13.0–17.0)
MCH: 30.7 pg (ref 26.0–34.0)
MCHC: 32.7 g/dL (ref 30.0–36.0)
MCV: 93.8 fL (ref 80.0–100.0)
Platelets: 287 10*3/uL (ref 150–400)
RBC: 5.02 MIL/uL (ref 4.22–5.81)
RDW: 13.6 % (ref 11.5–15.5)
WBC: 10.9 10*3/uL — ABNORMAL HIGH (ref 4.0–10.5)
nRBC: 0 % (ref 0.0–0.2)

## 2024-01-19 LAB — SURGICAL PCR SCREEN
MRSA, PCR: NEGATIVE
Staphylococcus aureus: NEGATIVE

## 2024-01-19 NOTE — Progress Notes (Signed)
 PCP - Dr. Lavada Mesi Cardiologist - Dr. Charlton Haws  PPM/ICD - denies Device Orders - na Rep Notified - na  Chest x-ray - na EKG - 12/22/2023 Stress Test - 12/23/2022 ECHO - 09/19/2018 Cardiac Cath - 07/08/2022  Sleep Study - denies CPAP - denies  Non-diabetic  Blood Thinner Instructions: Plavix, states last dose over 3 weeks ago Aspirin Instructions: ASA, states last dose over 3 weeks ago  ERAS Protcol - Ensure until 0430  Anesthesia review: Yes.  CAD, HTN, COPD, cardiac clearance  Patient denies shortness of breath, fever, cough and chest pain at PAT appointment   All instructions explained to the patient, with a verbal understanding of the material. Patient agrees to go over the instructions while at home for a better understanding. Patient also instructed to self quarantine after being tested for COVID-19. The opportunity to ask questions was provided.

## 2024-01-20 NOTE — Anesthesia Preprocedure Evaluation (Addendum)
 Anesthesia Evaluation  Patient identified by MRN, date of birth, ID band Patient awake    Reviewed: Allergy & Precautions, NPO status , Patient's Chart, lab work & pertinent test results  Airway Mallampati: II  TM Distance: >3 FB Neck ROM: Full    Dental  (+) Dental Advisory Given, Missing, Chipped, Poor Dentition   Pulmonary COPD,  COPD inhaler, Current SmokerPatient did not abstain from smoking.   - rhonchi + decreased breath sounds      Cardiovascular hypertension, Pt. on medications + angina  + CAD and + Cardiac Stents   Rhythm:Regular Rate:Normal  Stress 12/2022   Findings are consistent with no ischemia. The study is low risk.   No ST deviation was noted.   LV perfusion is abnormal. Defect 1: There is a medium defect with mild reduction in uptake present in the apical to basal inferior location(s) that is fixed. There is normal wall motion in the defect area. Consistent with artifact.   Left ventricular function is abnormal. Global function is mildly reduced. Nuclear stress EF: 52 %. The left ventricular ejection fraction is mildly decreased (45-54%). End diastolic cavity size is normal. End systolic cavity size is normal.   Prior study not available for comparison.   Inferior wall with decreased uptake at rest that improves slightly with stress, most consistent with artifact. No ischemia noted. Mildly reduced LVEF without focal wall motion abnormalities.   TTE 2024   Findings are consistent with no ischemia. The study is low risk.   No ST deviation was noted.   LV perfusion is abnormal. Defect 1: There is a medium defect with mild reduction in uptake present in the apical to basal inferior location(s) that is fixed. There is normal wall motion in the defect area. Consistent with artifact.   Left ventricular function is abnormal. Global function is mildly reduced. Nuclear stress EF: 52 %. The left ventricular ejection  fraction is mildly decreased (45-54%). End diastolic cavity size is normal. End systolic cavity size is normal.   Prior study not available for comparison.    Neuro/Psych negative neurological ROS  negative psych ROS   GI/Hepatic negative GI ROS,,,(+)     substance abuse    Endo/Other  negative endocrine ROS    Renal/GU negative Renal ROS     Musculoskeletal negative musculoskeletal ROS (+)  narcotic dependent  Abdominal   Peds  Hematology  (+) Blood dyscrasia (plavix)   Anesthesia Other Findings   Reproductive/Obstetrics                             Anesthesia Physical Anesthesia Plan  ASA: 3  Anesthesia Plan: Spinal   Post-op Pain Management: Regional block* and Tylenol PO (pre-op)*   Induction: Intravenous  PONV Risk Score and Plan: 1 and Ondansetron, Dexamethasone and Midazolam  Airway Management Planned:   Additional Equipment:   Intra-op Plan:   Post-operative Plan:   Informed Consent: I have reviewed the patients History and Physical, chart, labs and discussed the procedure including the risks, benefits and alternatives for the proposed anesthesia with the patient or authorized representative who has indicated his/her understanding and acceptance.     Dental advisory given  Plan Discussed with: CRNA  Anesthesia Plan Comments: (PAT note written 01/20/2024 by Shonna Chock, PA-C.  Patient delayed to operating room awaiting cardiology guidance on interruption DAPT. Pt has been off ASA and plavix since prior to his hernia surgery 12/27/2023. Pt states he started  ASA again on 01/24/2024.    Risks of anesthesia explained at length. This includes, but is not limited to, sore throat, damage to teeth, lips gums, tongue and vocal cords, nausea and vomiting, reactions to medications, stroke, heart attack, and death. All patient questions were answered and the patient wishes to proceed. Risks of peripheral nerve block and spinal  explained at length. This includes, but is not limited to, bleeding, infection, reactions to the medications, seizures, damage to surrounding structures, damage to nerves, permanent weakness, numbness, tingling and pain. All patient questions were answered and patient wishes to proceed with nerve block and spinal. )       Anesthesia Quick Evaluation

## 2024-01-20 NOTE — Progress Notes (Signed)
 Anesthesia Chart Review:  Case: [id number] Date/Time: 01/27/24 0715   Procedure: RIGHT TOTAL KNEE ARTHROPLASTY (Right: Knee)   Anesthesia type: Spinal   Pre-op diagnosis: right knee osteoarthritis   [facility]: [facility] ROOM 11 / [facility]   Surgeons: Cammy Copa, MD       DISCUSSION: Patient is a 68 year old male scheduled for the above procedure.   History includes smoking, COPD, hypertension, CAD (DES mid LAD 07/08/22 for unstable angina), spinal surgery (L4-5 PLIF 04/29/07), hernia (left IHR 03/21/01; right IHR 12/27/23).   His last cardiology evaluation was on 12/27/23 with Neila Gear, NP for follow-up and preoperative evaluation. He had DES to mid LAD on 07/08/22. His last stress test was on 12/23/22 after he reported chest tightness. Results showed inferior wall perfusion abnormality that improved slightly with stress and felt most consistent with artifact, no ischemia, EF 45-54%. He denied SOB or chest pain. 12 month follow-up planned. In regards to surgery, he wrote: "Preoperative clearance: -Patient's RCRI score is 6.6%   The patient affirms he has been doing well without any new cardiac symptoms. They are able to achieve 7 METS without cardiac limitations. Therefore, based on ACC/AHA guidelines, the patient would be at acceptable risk for the planned procedure without further cardiovascular testing. The patient was advised that if he develops new symptoms prior to surgery to contact our office to arrange for a follow-up visit, and he verbalized understanding.    -Patient can hold Plavix 5 days prior to procedure and should continue aspirin 81 mg through the perioperative period." Mr. Reppert reported his Plavix hast still been on hold since his hernia repair. He says he is also holding ASA, so I will reach out to Dr. Diamantina [facility] office to clarify with patient.    Anesthesia team to evaluate on the day of surgery.     VS: BP 131/76   Pulse 69   Temp 36.6 C   Resp 17   Ht 5\' 8"  (1.727 m)    Wt 82.1 kg   SpO2 97%   BMI 27.52 kg/m   PROVIDERS: Hilts, Casimiro Needle, MD is PCP  Charlton Haws, MD is cardiologist   LABS: Labs reviewed: Acceptable for surgery. (all labs ordered are listed, but only abnormal results are displayed)  Labs Reviewed  CBC - Abnormal; Notable for the following components:      Result Value   WBC 10.9 (*)    All other components within normal limits  URINALYSIS, W/ REFLEX TO CULTURE (INFECTION SUSPECTED) - Abnormal; Notable for the following components:   Leukocytes,Ua TRACE (*)    All other components within normal limits  SURGICAL PCR SCREEN  BASIC METABOLIC PANEL    IMAGES: MRI Right knee 11/19/23: IMPRESSION: 1. Complex tear of the posterior horn medial meniscus and radial tear of the body of the medial meniscus. 2. High-grade partial-thickness cartilage loss of the medial patellar facet with mild subchondral marrow edema. Mild partial-thickness cartilage loss of the medial trochlea. 3. High-grade partial-thickness cartilage loss of the weight-bearing medial femorotibial compartment. 4. Large joint effusion.   CT Chest LCS 05/04/23: IMPRESSION: 1. Lung-RADS 2, benign appearance or behavior. Continue annual screening with low-dose chest CT without contrast in 12 months. 2. Coronary artery calcifications. 3. Aortic Atherosclerosis (ICD10-I70.0) and Emphysema (ICD10-J43.9).     EKG: 12/22/23: NSR     CV: Nuclear stress test 12/23/22:   Findings are consistent with no ischemia. The study is low risk.   No ST deviation was noted.  LV perfusion is abnormal. Defect 1: There is a medium defect with mild reduction in uptake present in the apical to basal inferior location(s) that is fixed. There is normal wall motion in the defect area. Consistent with artifact.   Left ventricular function is abnormal. Global function is mildly reduced. Nuclear stress EF: 52 %. The left ventricular ejection fraction is mildly decreased (45-54%). End diastolic  cavity size is normal. End systolic cavity size is normal.   Prior study not available for comparison.   Inferior wall with decreased uptake at rest that improves slightly with stress, most consistent with artifact. No ischemia noted. Mildly reduced LVEF without focal wall motion abnormalities.     Cardiac cath 07/08/22:   Prox RCA lesion is 20% stenosed.   Ost Cx to Prox Cx lesion is 40% stenosed.   Ramus lesion is 40% stenosed.   Prox LAD to Mid LAD lesion is 70% stenosed.   A drug-eluting stent was successfully placed using a SYNERGY XD [id number].   Post intervention, there is a 0% residual stenosis.   Severe mid LAD stenosis. RFR 0.84.  Mild non-obstructive disease in the ostial Circumflex and intermediate branch.  Mild non-obstructive disease in the mid RCA Successful PTCA/DES x 1 mid LAD   Recommendations: Continue DAPT with ASA and Plavix for at least six months. Continue beta blocker and statin.   Past Medical History:  Diagnosis Date   COPD (chronic obstructive pulmonary disease) (HCC)    Coronary artery disease    Hypertension 02/12/2020    Past Surgical History:  Procedure Laterality Date   ANKLE FUSION  2006   BACK SURGERY  2008   L5-5 fusion    CORONARY PRESSURE/FFR STUDY N/A 07/08/2022   Procedure: INTRAVASCULAR PRESSURE WIRE/FFR STUDY;  Surgeon: Kathleene Hazel, MD;  [facility]: [facility];  Service: Cardiovascular;  Laterality: N/A;   CORONARY STENT INTERVENTION N/A 07/08/2022   Procedure: CORONARY STENT INTERVENTION;  Surgeon: Kathleene Hazel, MD;  [facility]: [facility];  Service: Cardiovascular;  Laterality: N/A;   INGUINAL HERNIA REPAIR Right 12/27/2023   Procedure: OPEN RIGHT INGUINAL HERNIA REPAIR WITH MESH;  Surgeon: Emelia Loron, MD;  Location: [facility] OR;  Service: General;  Laterality: Right;   LEFT HEART CATH AND CORONARY ANGIOGRAPHY N/A 07/08/2022   Procedure: LEFT HEART CATH AND CORONARY ANGIOGRAPHY;  Surgeon: Kathleene Hazel, MD;  [facility]: [facility];  Service: Cardiovascular;  Laterality: N/A;   left wrist surgery      MEDICATIONS:  albuterol (VENTOLIN HFA) 108 (90 Base) MCG/ACT inhaler   amLODipine (NORVASC) 5 MG tablet   aspirin EC 81 MG tablet   baclofen (LIORESAL) 10 MG tablet   clopidogrel (PLAVIX) 75 MG tablet   Ferrous Sulfate Dried (FERROUS SULFATE CR PO)   Fluticasone-Umeclidin-Vilant (TRELEGY ELLIPTA) 200-62.5-25 MCG/ACT AEPB   HYDROcodone-acetaminophen (NORCO) 7.5-325 MG tablet   nitroGLYCERIN (NITROSTAT) 0.4 MG SL tablet   oxyCODONE (OXY IR/ROXICODONE) 5 MG immediate release tablet   rosuvastatin (CRESTOR) 40 MG tablet   sildenafil (REVATIO) 20 MG tablet   No current facility-administered medications for this encounter.    Shonna Chock, PA-C Surgical Short Stay/Anesthesiology [facility] Phone [phone] [facility] Phone [phone] 01/20/2024 3:35 PM

## 2024-01-21 ENCOUNTER — Telehealth: Payer: Self-pay

## 2024-01-21 NOTE — Telephone Encounter (Signed)
 Pls advise.

## 2024-01-21 NOTE — Telephone Encounter (Signed)
 Called patient

## 2024-01-21 NOTE — Telephone Encounter (Signed)
-----   Message from Cathleen Corti sent at 01/21/2024  8:50 AM EDT ----- Regarding: PLEASE ASK DR August Saucer FROM [facility] with Short Stay Anesthesia:    FYI: Per cardiac clearance, they recommended he stay on ASA for surgery (but gave ok to hold Plavix). The PAT nurse documented that he was holding both ASA and Plavix, so I wasn't sure if that came from Dr. August Saucer (or the patient held ASA on his own). From an anesthesia standpoint he can continue ASA, but would need to hold the Plavix for spinal. If Dr. August Saucer wants him to stay of ASA then the patient would need to know.   PLEASE ADVISE PT:  [phone]

## 2024-01-21 NOTE — Telephone Encounter (Signed)
 Ok to stay on asa thx

## 2024-01-27 ENCOUNTER — Observation Stay (HOSPITAL_COMMUNITY)
Admission: RE | Admit: 2024-01-27 | Discharge: 2024-01-27 | Disposition: A | Discharge status: Home / Self Care | Payer: Medicare Other | Attending: Orthopedic Surgery | Admitting: Orthopedic Surgery

## 2024-01-27 ENCOUNTER — Encounter (HOSPITAL_COMMUNITY)
Admission: RE | Disposition: A | Discharge status: Home / Self Care | Payer: Self-pay | Source: Home / Self Care | Attending: Orthopedic Surgery

## 2024-01-27 ENCOUNTER — Other Ambulatory Visit: Payer: Self-pay

## 2024-01-27 ENCOUNTER — Ambulatory Visit (HOSPITAL_COMMUNITY): Payer: Self-pay | Admitting: Vascular Surgery

## 2024-01-27 ENCOUNTER — Ambulatory Visit (HOSPITAL_COMMUNITY): Admitting: Anesthesiology

## 2024-01-27 ENCOUNTER — Encounter (HOSPITAL_COMMUNITY): Payer: Self-pay | Admitting: Orthopedic Surgery

## 2024-01-27 DIAGNOSIS — M1711 Unilateral primary osteoarthritis, right knee: Secondary | ICD-10-CM

## 2024-01-27 DIAGNOSIS — J441 Chronic obstructive pulmonary disease with (acute) exacerbation: Secondary | ICD-10-CM | POA: Diagnosis not present

## 2024-01-27 DIAGNOSIS — F17210 Nicotine dependence, cigarettes, uncomplicated: Secondary | ICD-10-CM | POA: Insufficient documentation

## 2024-01-27 DIAGNOSIS — I25119 Atherosclerotic heart disease of native coronary artery with unspecified angina pectoris: Secondary | ICD-10-CM

## 2024-01-27 DIAGNOSIS — I209 Angina pectoris, unspecified: Secondary | ICD-10-CM

## 2024-01-27 DIAGNOSIS — I10 Essential (primary) hypertension: Secondary | ICD-10-CM | POA: Diagnosis not present

## 2024-01-27 DIAGNOSIS — I2510 Atherosclerotic heart disease of native coronary artery without angina pectoris: Secondary | ICD-10-CM

## 2024-01-27 DIAGNOSIS — I25110 Atherosclerotic heart disease of native coronary artery with unstable angina pectoris: Secondary | ICD-10-CM | POA: Insufficient documentation

## 2024-01-27 DIAGNOSIS — E782 Mixed hyperlipidemia: Secondary | ICD-10-CM

## 2024-01-27 DIAGNOSIS — Z955 Presence of coronary angioplasty implant and graft: Secondary | ICD-10-CM | POA: Insufficient documentation

## 2024-01-27 DIAGNOSIS — Z01810 Encounter for preprocedural cardiovascular examination: Secondary | ICD-10-CM

## 2024-01-27 DIAGNOSIS — Z96651 Presence of right artificial knee joint: Secondary | ICD-10-CM

## 2024-01-27 DIAGNOSIS — Z01818 Encounter for other preprocedural examination: Secondary | ICD-10-CM

## 2024-01-27 HISTORY — PX: TOTAL KNEE ARTHROPLASTY: SHX125

## 2024-01-27 SURGERY — ARTHROPLASTY, KNEE, TOTAL
Anesthesia: Spinal | Site: Knee | Laterality: Right

## 2024-01-27 MED ORDER — ACETAMINOPHEN 325 MG PO TABS: 325.0000 mg | ORAL_TABLET | Freq: Four times a day (QID) | ORAL | Status: DC | PRN

## 2024-01-27 MED ORDER — UMECLIDINIUM BROMIDE 62.5 MCG/ACT IN AEPB
1.0000 | INHALATION_SPRAY | Freq: Every day | RESPIRATORY_TRACT | Status: DC
  Filled 2024-01-27: qty 7

## 2024-01-27 MED ORDER — DOCUSATE SODIUM 100 MG PO CAPS
100.0000 mg | ORAL_CAPSULE | Freq: Two times a day (BID) | ORAL | Status: DC
  Administered 2024-01-27: 100 mg via ORAL
  Filled 2024-01-27: qty 1

## 2024-01-27 MED ORDER — CLONIDINE HCL (ANALGESIA) 100 MCG/ML EP SOLN
EPIDURAL | Status: DC | PRN
  Administered 2024-01-27: 70 ug

## 2024-01-27 MED ORDER — ONDANSETRON HCL 4 MG PO TABS: 4.0000 mg | ORAL_TABLET | Freq: Four times a day (QID) | ORAL | Status: DC | PRN

## 2024-01-27 MED ORDER — ACETAMINOPHEN 500 MG PO TABS
1000.0000 mg | ORAL_TABLET | Freq: Four times a day (QID) | ORAL | Status: DC
  Administered 2024-01-27: 1000 mg via ORAL
  Filled 2024-01-27 (×2): qty 2

## 2024-01-27 MED ORDER — MORPHINE SULFATE (PF) 4 MG/ML IV SOLN
INTRAVENOUS | Status: AC
  Filled 2024-01-27: qty 2

## 2024-01-27 MED ORDER — DEXAMETHASONE SODIUM PHOSPHATE 4 MG/ML IJ SOLN
INTRAMUSCULAR | Status: DC | PRN
  Administered 2024-01-27: 5 mg via PERINEURAL

## 2024-01-27 MED ORDER — POVIDONE-IODINE 10 % EX SWAB
2.0000 | Freq: Once | CUTANEOUS | Status: AC
  Administered 2024-01-27: 2 via TOPICAL

## 2024-01-27 MED ORDER — OXYCODONE HCL 5 MG PO TABS
5.0000 mg | ORAL_TABLET | ORAL | Status: DC | PRN
  Administered 2024-01-27: 10 mg via ORAL
  Filled 2024-01-27: qty 2

## 2024-01-27 MED ORDER — ROPIVACAINE HCL 7.5 MG/ML IJ SOLN
INTRAMUSCULAR | Status: DC | PRN
  Administered 2024-01-27: 20 mL via PERINEURAL

## 2024-01-27 MED ORDER — MIDAZOLAM HCL 2 MG/2ML IJ SOLN
INTRAMUSCULAR | Status: DC | PRN
  Administered 2024-01-27: 2 mg via INTRAVENOUS

## 2024-01-27 MED ORDER — STERILE WATER FOR IRRIGATION IR SOLN
Status: DC | PRN
  Administered 2024-01-27: 1000 mL

## 2024-01-27 MED ORDER — FLUTICASONE FUROATE-VILANTEROL 200-25 MCG/ACT IN AEPB: 1.0000 | INHALATION_SPRAY | Freq: Every day | RESPIRATORY_TRACT | Status: DC

## 2024-01-27 MED ORDER — HYDROMORPHONE HCL 1 MG/ML IJ SOLN: 0.5000 mg | INTRAMUSCULAR | Status: DC | PRN

## 2024-01-27 MED ORDER — ORAL CARE MOUTH RINSE: 15.0000 mL | Freq: Once | OROMUCOSAL | Status: AC

## 2024-01-27 MED ORDER — CHLORHEXIDINE GLUCONATE 0.12 % MT SOLN
15.0000 mL | Freq: Once | OROMUCOSAL | Status: AC
  Administered 2024-01-27: 15 mL via OROMUCOSAL
  Filled 2024-01-27: qty 15

## 2024-01-27 MED ORDER — CLONIDINE HCL (ANALGESIA) 100 MCG/ML EP SOLN
EPIDURAL | Status: AC
  Filled 2024-01-27: qty 10

## 2024-01-27 MED ORDER — VANCOMYCIN HCL 1000 MG IV SOLR
INTRAVENOUS | Status: DC | PRN
  Administered 2024-01-27: 1000 mg

## 2024-01-27 MED ORDER — MENTHOL 3 MG MT LOZG: 1.0000 | LOZENGE | OROMUCOSAL | Status: DC | PRN

## 2024-01-27 MED ORDER — METOCLOPRAMIDE HCL 5 MG/ML IJ SOLN: 5.0000 mg | Freq: Three times a day (TID) | INTRAMUSCULAR | Status: DC | PRN

## 2024-01-27 MED ORDER — DROPERIDOL 2.5 MG/ML IJ SOLN: 0.6250 mg | Freq: Once | INTRAMUSCULAR | Status: DC | PRN

## 2024-01-27 MED ORDER — METOCLOPRAMIDE HCL 5 MG PO TABS: 5.0000 mg | ORAL_TABLET | Freq: Three times a day (TID) | ORAL | Status: DC | PRN

## 2024-01-27 MED ORDER — PHENOL 1.4 % MT LIQD: 1.0000 | OROMUCOSAL | Status: DC | PRN

## 2024-01-27 MED ORDER — METHOCARBAMOL 500 MG PO TABS
500.0000 mg | ORAL_TABLET | Freq: Four times a day (QID) | ORAL | Status: DC | PRN
  Administered 2024-01-27: 500 mg via ORAL
  Filled 2024-01-27: qty 1

## 2024-01-27 MED ORDER — ASPIRIN 81 MG PO TBEC: 81.0000 mg | DELAYED_RELEASE_TABLET | Freq: Two times a day (BID) | ORAL | Status: AC

## 2024-01-27 MED ORDER — CEFAZOLIN SODIUM-DEXTROSE 2-4 GM/100ML-% IV SOLN
2.0000 g | Freq: Three times a day (TID) | INTRAVENOUS | Status: DC
  Administered 2024-01-27: 2 g via INTRAVENOUS
  Filled 2024-01-27: qty 100

## 2024-01-27 MED ORDER — CEFAZOLIN SODIUM-DEXTROSE 2-4 GM/100ML-% IV SOLN
2.0000 g | INTRAVENOUS | Status: AC
  Administered 2024-01-27: 2 g via INTRAVENOUS
  Filled 2024-01-27: qty 100

## 2024-01-27 MED ORDER — IRRISEPT - 450ML BOTTLE WITH 0.05% CHG IN STERILE WATER, USP 99.95% OPTIME
TOPICAL | Status: DC | PRN
  Administered 2024-01-27: 450 mL via TOPICAL

## 2024-01-27 MED ORDER — HYDROMORPHONE HCL 1 MG/ML IJ SOLN: 0.2500 mg | INTRAMUSCULAR | Status: DC | PRN

## 2024-01-27 MED ORDER — TRANEXAMIC ACID-NACL 1000-0.7 MG/100ML-% IV SOLN
1000.0000 mg | INTRAVENOUS | Status: DC
  Filled 2024-01-27: qty 100

## 2024-01-27 MED ORDER — 0.9 % SODIUM CHLORIDE (POUR BTL) OPTIME
TOPICAL | Status: DC | PRN
  Administered 2024-01-27 (×4): 1000 mL

## 2024-01-27 MED ORDER — MIDAZOLAM HCL 2 MG/2ML IJ SOLN
INTRAMUSCULAR | Status: AC
Start: 2024-01-27 — End: ?
  Filled 2024-01-27: qty 2

## 2024-01-27 MED ORDER — SODIUM CHLORIDE 0.9 % IR SOLN
Status: DC | PRN
Start: 2024-01-27 — End: 2024-01-27
  Administered 2024-01-27: 3000 mL

## 2024-01-27 MED ORDER — LACTATED RINGERS IV SOLN
INTRAVENOUS | Status: DC | PRN
Start: 2024-01-27 — End: 2024-01-27

## 2024-01-27 MED ORDER — ALBUTEROL SULFATE HFA 108 (90 BASE) MCG/ACT IN AERS: 1.0000 | INHALATION_SPRAY | RESPIRATORY_TRACT | Status: DC | PRN

## 2024-01-27 MED ORDER — METHOCARBAMOL 1000 MG/10ML IJ SOLN: 500.0000 mg | Freq: Four times a day (QID) | INTRAMUSCULAR | Status: DC | PRN

## 2024-01-27 MED ORDER — POVIDONE-IODINE 7.5 % EX SOLN
Freq: Once | CUTANEOUS | Status: AC
  Filled 2024-01-27: qty 118

## 2024-01-27 MED ORDER — ROSUVASTATIN CALCIUM 20 MG PO TABS: 40.0000 mg | ORAL_TABLET | Freq: Every day | ORAL | Status: DC

## 2024-01-27 MED ORDER — DEXAMETHASONE SODIUM PHOSPHATE 10 MG/ML IJ SOLN
INTRAMUSCULAR | Status: DC | PRN
  Administered 2024-01-27: 10 mg via INTRAVENOUS

## 2024-01-27 MED ORDER — AMLODIPINE BESYLATE 5 MG PO TABS: 5.0000 mg | ORAL_TABLET | Freq: Every day | ORAL | Status: DC

## 2024-01-27 MED ORDER — PROPOFOL 10 MG/ML IV BOLUS
INTRAVENOUS | Status: DC | PRN
  Administered 2024-01-27: 50 ug/kg/min via INTRAVENOUS

## 2024-01-27 MED ORDER — DOCUSATE SODIUM 100 MG PO CAPS: 100.0000 mg | ORAL_CAPSULE | Freq: Two times a day (BID) | ORAL | 0 refills | Status: DC

## 2024-01-27 MED ORDER — BUPIVACAINE LIPOSOME 1.3 % IJ SUSP
INTRAMUSCULAR | Status: AC
  Filled 2024-01-27: qty 20

## 2024-01-27 MED ORDER — OXYCODONE HCL 5 MG/5ML PO SOLN: 5.0000 mg | Freq: Once | ORAL | Status: DC | PRN

## 2024-01-27 MED ORDER — BUPIVACAINE HCL (PF) 0.25 % IJ SOLN
INTRAMUSCULAR | Status: AC
  Filled 2024-01-27: qty 30

## 2024-01-27 MED ORDER — OXYCODONE HCL 5 MG PO TABS: 5.0000 mg | ORAL_TABLET | ORAL | 0 refills | Status: DC | PRN

## 2024-01-27 MED ORDER — OXYCODONE HCL 5 MG PO TABS: 5.0000 mg | ORAL_TABLET | Freq: Once | ORAL | Status: DC | PRN

## 2024-01-27 MED ORDER — BUPIVACAINE LIPOSOME 1.3 % IJ SUSP
INTRAMUSCULAR | Status: DC | PRN
  Administered 2024-01-27: 60 mL via INTRA_ARTICULAR

## 2024-01-27 MED ORDER — ASPIRIN 81 MG PO CHEW: 81.0000 mg | CHEWABLE_TABLET | Freq: Two times a day (BID) | ORAL | Status: DC

## 2024-01-27 MED ORDER — ALBUTEROL SULFATE (2.5 MG/3ML) 0.083% IN NEBU: 2.5000 mg | INHALATION_SOLUTION | Freq: Four times a day (QID) | RESPIRATORY_TRACT | Status: DC | PRN

## 2024-01-27 MED ORDER — MORPHINE SULFATE 4 MG/ML IJ SOLN
INTRAMUSCULAR | Status: DC | PRN
  Administered 2024-01-27: 12 mL via INTRA_ARTICULAR

## 2024-01-27 MED ORDER — PROPOFOL 10 MG/ML IV BOLUS
INTRAVENOUS | Status: AC
  Filled 2024-01-27: qty 20

## 2024-01-27 MED ORDER — TRANEXAMIC ACID 1000 MG/10ML IV SOLN
2000.0000 mg | INTRAVENOUS | Status: DC
  Filled 2024-01-27: qty 20

## 2024-01-27 MED ORDER — POVIDONE-IODINE 10 % EX SWAB: 2.0000 | Freq: Once | CUTANEOUS | Status: DC

## 2024-01-27 MED ORDER — ACETAMINOPHEN 500 MG PO TABS: 1000.0000 mg | ORAL_TABLET | Freq: Once | ORAL | Status: DC

## 2024-01-27 MED ORDER — VANCOMYCIN HCL 1000 MG IV SOLR
INTRAVENOUS | Status: AC
  Filled 2024-01-27: qty 20

## 2024-01-27 MED ORDER — BUPIVACAINE IN DEXTROSE 0.75-8.25 % IT SOLN
INTRATHECAL | Status: DC | PRN
  Administered 2024-01-27: 1.8 mL via INTRATHECAL

## 2024-01-27 MED ORDER — TRANEXAMIC ACID 1000 MG/10ML IV SOLN
INTRAVENOUS | Status: DC | PRN
  Administered 2024-01-27: 2000 mg via TOPICAL

## 2024-01-27 SURGICAL SUPPLY — 76 items
BAG COUNTER SPONGE SURGICOUNT (BAG) ×2 IMPLANT
BAG DECANTER FOR FLEXI CONT (MISCELLANEOUS) ×2 IMPLANT
BANDAGE ESMARK 6X9 LF (GAUZE/BANDAGES/DRESSINGS) ×2 IMPLANT
BLADE SAG 18X100X1.27 (BLADE) ×2 IMPLANT
BLADE SAW SGTL 13X75X1.27 (BLADE) ×2 IMPLANT
BLADE SAW THK.89X75X18XSGTL (BLADE) ×2 IMPLANT
BNDG COHESIVE 6X5 TAN ST LF (GAUZE/BANDAGES/DRESSINGS) ×2 IMPLANT
BNDG ELASTIC 6X15 VLCR STRL LF (GAUZE/BANDAGES/DRESSINGS) ×2 IMPLANT
BNDG ESMARK 6X9 LF (GAUZE/BANDAGES/DRESSINGS) ×1 IMPLANT
BOWL SMART MIX CTS (DISPOSABLE) IMPLANT
CNTNR URN SCR LID CUP LEK RST (MISCELLANEOUS) ×2 IMPLANT
COMP PATELLA 3 PEG 35 (Joint) ×1 IMPLANT
COMPONENT PATELLA 3 PEG 35 (Joint) IMPLANT
COOLER ICEMAN CLASSIC (MISCELLANEOUS) IMPLANT
COVER SURGICAL LIGHT HANDLE (MISCELLANEOUS) ×2 IMPLANT
CUFF TOURN SGL QUICK 42 (TOURNIQUET CUFF) IMPLANT
CUFF TRNQT CYL 34X4.125X (TOURNIQUET CUFF) ×2 IMPLANT
DRAPE INCISE IOBAN 66X45 STRL (DRAPES) IMPLANT
DRAPE SURG ORHT 6 SPLT 77X108 (DRAPES) ×6 IMPLANT
DRAPE U-SHAPE 47X51 STRL (DRAPES) ×2 IMPLANT
DRSG AQUACEL AG ADV 3.5X10 (GAUZE/BANDAGES/DRESSINGS) IMPLANT
DRSG AQUACEL AG ADV 3.5X14 (GAUZE/BANDAGES/DRESSINGS) IMPLANT
DURAPREP 26ML APPLICATOR (WOUND CARE) ×4 IMPLANT
ELECT CAUTERY BLADE 6.4 (BLADE) ×2 IMPLANT
ELECT REM PT RETURN 9FT ADLT (ELECTROSURGICAL) ×1 IMPLANT
ELECTRODE REM PT RTRN 9FT ADLT (ELECTROSURGICAL) ×2 IMPLANT
GAUZE SPONGE 4X4 12PLY STRL (GAUZE/BANDAGES/DRESSINGS) ×2 IMPLANT
GLOVE BIOGEL PI IND STRL 6.5 (GLOVE) ×2 IMPLANT
GLOVE BIOGEL PI IND STRL 7.0 (GLOVE) ×2 IMPLANT
GLOVE BIOGEL PI IND STRL 8 (GLOVE) ×2 IMPLANT
GLOVE ECLIPSE 7.0 STRL STRAW (GLOVE) ×2 IMPLANT
GLOVE ECLIPSE 8.0 STRL XLNG CF (GLOVE) ×2 IMPLANT
GLOVE SURG ENC MOIS LTX SZ6.5 (GLOVE) ×6 IMPLANT
GOWN STRL REUS W/ TWL LRG LVL3 (GOWN DISPOSABLE) ×6 IMPLANT
HOOD PEEL AWAY T7 (MISCELLANEOUS) ×6 IMPLANT
IMMOBILIZER KNEE 20 (SOFTGOODS) IMPLANT
IMMOBILIZER KNEE 20 THIGH 36 (SOFTGOODS) IMPLANT
IMMOBILIZER KNEE 22 UNIV (SOFTGOODS) IMPLANT
IMMOBILIZER KNEE 24 THIGH 36 (MISCELLANEOUS) IMPLANT
IMMOBILIZER KNEE 24 UNIV (MISCELLANEOUS) IMPLANT
INSERT TIB ARTI SZ8-11 RT 11 (Joint) IMPLANT
KIT BASIN OR (CUSTOM PROCEDURE TRAY) ×2 IMPLANT
KIT TURNOVER KIT B (KITS) ×2 IMPLANT
MANIFOLD NEPTUNE II (INSTRUMENTS) ×2 IMPLANT
NDL 22X1.5 STRL (OR ONLY) (MISCELLANEOUS) ×4 IMPLANT
NDL SPNL 18GX3.5 QUINCKE PK (NEEDLE) ×2 IMPLANT
NEEDLE 22X1.5 STRL (OR ONLY) (MISCELLANEOUS) ×2 IMPLANT
NEEDLE SPNL 18GX3.5 QUINCKE PK (NEEDLE) ×1 IMPLANT
NS IRRIG 1000ML POUR BTL (IV SOLUTION) ×4 IMPLANT
PACK TOTAL JOINT (CUSTOM PROCEDURE TRAY) ×2 IMPLANT
PAD ARMBOARD POSITIONER FOAM (MISCELLANEOUS) ×4 IMPLANT
PAD CAST 4YDX4 CTTN HI CHSV (CAST SUPPLIES) ×2 IMPLANT
PAD COLD SHLDR WRAP-ON (PAD) IMPLANT
PADDING CAST COTTON 6X4 STRL (CAST SUPPLIES) ×2 IMPLANT
PIN DRILL HDLS TROCAR 75 4PK (PIN) IMPLANT
PROS FEM KNEE PS STD 9 RT (Joint) ×1 IMPLANT
PROS TIB KNEE PS 0D F RT (Joint) ×1 IMPLANT
PROSTHESIS FEM KNEE PS STD9 RT (Joint) IMPLANT
PROSTHESIS TIB KNEE PS 0D F RT (Joint) IMPLANT
SCREW FEMALE HEX FIX 25X2.5 (ORTHOPEDIC DISPOSABLE SUPPLIES) IMPLANT
SET HNDPC FAN SPRY TIP SCT (DISPOSABLE) ×2 IMPLANT
SPIKE FLUID TRANSFER (MISCELLANEOUS) ×2 IMPLANT
STRIP CLOSURE SKIN 1/2X4 (GAUZE/BANDAGES/DRESSINGS) ×4 IMPLANT
SUCTION TUBE FRAZIER 10FR DISP (SUCTIONS) ×2 IMPLANT
SUT MNCRL AB 3-0 PS2 18 (SUTURE) ×2 IMPLANT
SUT VIC AB 0 CT1 27XBRD ANBCTR (SUTURE) ×6 IMPLANT
SUT VIC AB 1 CT1 36 (SUTURE) ×10 IMPLANT
SUT VIC AB 2-0 CT2 27 (SUTURE) ×8 IMPLANT
SUT VICRYL 0 27 CT2 27 ABS (SUTURE) ×2 IMPLANT
SYR 30ML LL (SYRINGE) ×6 IMPLANT
SYR TB 1ML LUER SLIP (SYRINGE) ×2 IMPLANT
TOWEL GREEN STERILE (TOWEL DISPOSABLE) ×4 IMPLANT
TOWEL GREEN STERILE FF (TOWEL DISPOSABLE) ×4 IMPLANT
TRAY CATH INTERMITTENT SS 16FR (CATHETERS) IMPLANT
WATER STERILE IRR 1000ML POUR (IV SOLUTION) IMPLANT
YANKAUER SUCT BULB TIP NO VENT (SUCTIONS) ×2 IMPLANT

## 2024-01-27 NOTE — Progress Notes (Signed)
 Daniel Hurley arrived to unit from PACU he is alert, oriented, on room air, wife present at bedside. He verbally states "I am leaving today, dr said I would leave after surgery and would be home" he is upset that he is being admitted. wife stated MD might DC pt later this evening. Continuous pulse oximetry is on pt and turned on, zero degree bone foam on and knee immobilizer is on RLE, ice man with pt from PACU.

## 2024-01-27 NOTE — Op Note (Signed)
 NAME: CHAYIM, BIALAS MEDICAL RECORD NO: [id number] ACCOUNT NO: [id number] DATE OF BIRTH: [birth year] FACILITY: [facility] LOCATION: [facility] PHYSICIAN: Graylin Shiver. August Saucer, MD  Operative Report   DATE OF PROCEDURE: 01/27/2024  PREOPERATIVE DIAGNOSIS:  Right knee arthritis.  POSTOPERATIVE DIAGNOSIS:  Right knee arthritis.  PROCEDURE:  Right total knee replacement using press-fit components, posterior cruciate retaining size 9 femur, size F press-fit spiked keel tibia, 3 peg, 35-mm press-fit patella with 11-mm medial congruent polyethylene liner.  SURGEON:  Graylin Shiver. August Saucer, MD  ASSISTANT:  Karenann Cai.  INDICATIONS:  The patient is a 68 year old patient with end-stage right knee arthritis who presents for operative management after explanation of the risks and benefits.  DESCRIPTION OF PROCEDURE:  The patient was brought to the operating room where spinal anesthetic was induced.  Preoperative antibiotics administered and timeout was called.  The right leg was prescrubbed with alcohol and Betadine allowed to air dry,  prepped with DuraPrep solution and draped in a sterile manner.  Ioban used to cover the operative field.  The leg was elevated and exsanguinated with the Esmarch wrap.  After calling timeout, anterior approach to the knee was made.  Skin and subcutaneous  tissue was sharply divided.  IrriSept solution was utilized.  Median parapatellar approach was made and marked with a #1 Vicryl suture.  IrriSept solution was again placed into the joint.  The patella was everted.  The fat pad partially excised.  Medial  soft tissue dissection was performed proportional to the patient's mild preop varus deformity.  Lateral patellofemoral ligament was released.  Soft tissue was removed from the anterior distal femur.  Anterior horn lateral meniscus released and ACL  released.  The posterior retractor was placed.  The patient did have severe end-stage arthritis both in the patella as well as the medial  femoral condyle.  The patient had significant tearing of the medial meniscus.  Intramedullary alignment was then  used to make a cut of initially 2 mm off the least affected medial tibial plateau later revised 2 more mm which gave about an 11-mm measured cut off the lateral tibial plateau.  Intramedullary alignment was then used to cut the femur in 5 degrees of  valgus 10 mm.  10-mm spacer fit within this space after making the revised cut to the tibia.  Collaterals and posterior neurovascular structures were protected.  Anterior, posterior, and chamfer cuts were then made after sizing the femur to a size 9.   The cut was made in 3 degrees of external rotation, which gave symmetric flexion and extension gaps.  Next, the tibial trial tray was placed in line with the medial third of the tibial tubercle and screwed in position.  Next, trial reduction was  performed with the size 9 femur and a 10 spacer.  Patella was then cut down from 25 to approximately 14 mm.  Patella trial was placed.  With trial components in position, the patient had full extension, full flexion, and good stability to varus and  valgus stress at 0, 30, and 90 degrees.  Final preparation made on both the femur and the tibia in terms of the keel punch.  Bone quality was excellent.  At this time, the trial components were removed.  Three liters of pulsatile irrigation was utilized  followed by anesthetizing the capsule using Marcaine, saline, and Exparel.  Next, the TXA sponge along with IrriSept solution was allowed to sit in the knee for 3 minutes.  This was removed.  Vancomycin powder was  placed into the tibial canal after that  was irrigated with IrriSept.  The tibia was then placed with good peripheral rim fit obtained and a good press-fit on the keel.  The femur was then placed also with excellent press-fit.  An 11-mm trial spacer was placed and the true 11-mm spacer was then  placed, which gave excellent range of motion with extension  and flexion and no lift-off.  Patella then placed as well.  The final construct had full range of motion, excellent patellar tracking with no-thumb technique and good stability to varus and  valgus stress at 0, 30 and 90 degrees.  Tourniquet released at this time.  Pouring irrigation x3 liters utilized.  Bleeding points encountered were controlled using electrocautery.  Median parapatellar arthrotomy was closed over a bolster using #1 Vicryl  suture.  Prior to final arthrotomy closure, we irrigated the knee joint out again with IrriSept solution, placed vancomycin powder into the knee joint, and then closed up the arthrotomy with #1 Vicryl suture.  A solution of Marcaine, morphine, and  clonidine was then injected into the knee for postop pain relief.  The skin was then closed using 0 Vicryl suture, 2-0 Vicryl suture, and 3-0 Monocryl with Steri-Strips and Aquacel dressing applied.  The patient was transferred to the recovery room in  stable condition.  Ace wrap and knee immobilizer placed.  Luke's assistance was required for opening, closing and mobilization of tissue.  His assistance was a medical necessity.   PUS D: 01/27/2024 12:26:05 pm T: 01/27/2024 1:28:00 pm  JOB: [id number]/ [id number]

## 2024-01-27 NOTE — Progress Notes (Signed)
 Physical Therapy Evaluation Patient Details Name: Daniel Hurley MRN: [id number] DOB: 1956-07-06 Today's Date: 01/27/2024  History of Present Illness  68 y.o. male presents to [facility] 01/27/24 for R TKA. PMHx: unstable angina, chronic neck pain, HTN, COPD, CAD, stent to LAD 07/08/22 without significant CAD in RCA/LCX  Clinical Impression  Pt in bed upon arrival with wife present and agreeable to PT eval. PTA, pt was independent for mobility with no AD and enjoyed playing golf. In today's session, pt was able to demonstrate TKA HEP with education provided for frequency and gait progression. Pt was able to perform bed mobility with supervision for safety and HOB elevated. Pt was able to stand and ambulate ~120 ft with RW and supervision for safety. He was also able to ascend/descend a flight of steps with 1HR and CGA. At home, pt only has a 1/2 step to enter and will have 24/7 physical assist available. Pt currently with functional limitations due to the deficits listed below (see PT Problem List). Pt would benefit from acute skilled PT to address functional impairments. Acute PT to follow.         If plan is discharge home, recommend the following: A little help with walking and/or transfers;A little help with bathing/dressing/bathroom;Assist for transportation;Help with stairs or ramp for entrance   Can travel by private vehicle    Yes    Equipment Recommendations None recommended by PT     Functional Status Assessment Patient has had a recent decline in their functional status and demonstrates the ability to make significant improvements in function in a reasonable and predictable amount of time.     Precautions / Restrictions Precautions Precautions: Fall Recall of Precautions/Restrictions: Intact Required Braces or Orthoses: Knee Immobilizer - Right Knee Immobilizer - Right: On when out of bed or walking Restrictions Weight Bearing Restrictions Per Provider Order: Yes RLE Weight Bearing  Per Provider Order: Weight bearing as tolerated      Mobility  Bed Mobility Overal bed mobility: Needs Assistance Bed Mobility: Supine to Sit, Sit to Supine    Supine to sit: Supervision, HOB elevated Sit to supine: Supervision, HOB elevated   General bed mobility comments: supervision for safety    Transfers Overall transfer level: Needs assistance Equipment used: Rolling walker (2 wheels) Transfers: Sit to/from Stand Sit to Stand: Supervision    General transfer comment: supervision for safety, cues for hand placement with RW    Ambulation/Gait Ambulation/Gait assistance: Supervision Gait Distance (Feet): 120 Feet Assistive device: Rolling walker (2 wheels) Gait Pattern/deviations: Step-to pattern, Step-through pattern, Decreased step length - right, Decreased dorsiflexion - right Gait velocity: decr    General Gait Details: progressed from step-to gait pattern to step through pattern. Decreased step length on R LE 2/2 pain  Stairs Stairs: Yes Stairs assistance: Contact guard assist Stair Management: One rail Left, Step to pattern, Alternating pattern, Forwards Number of Stairs: 12 General stair comments: step through pattern to ascend and step to pattern to descend. Cues for technique and CGA for safety. Increased difficulty with increased repetitions    Balance Overall balance assessment: Needs assistance Sitting-balance support: No upper extremity supported, Feet supported Sitting balance-Leahy Scale: Good     Standing balance support: Bilateral upper extremity supported, During functional activity, Reliant on assistive device for balance Standing balance-Leahy Scale: Fair Standing balance comment: able to stand w/ no AD, reliant on RW for gait          Pertinent Vitals/Pain Pain Assessment Pain Assessment: Faces Faces Pain  Scale: Hurts little more Pain Location: R LE Pain Descriptors / Indicators: Throbbing Pain Intervention(s): Limited activity within  patient's tolerance, Monitored during session, Repositioned, Premedicated before session    Home Living Family/patient expects to be discharged to:: Private residence Living Arrangements: Spouse/significant other Available Help at Discharge: Family;Available 24 hours/day Type of Home: House Home Access: Ramped entrance;Stairs to enter Entrance Stairs-Rails: Left Entrance Stairs-Number of Steps: 1 (half step)   Home Layout: One level Home Equipment: Grab bars - tub/shower;Rolling Walker (2 wheels);Cane - single point;Crutches;Wheelchair - manual      Prior Function Prior Level of Function : Independent/Modified Independent;Working/employed    Mobility Comments: active w/ no AD, played golf       Extremity/Trunk Assessment   Upper Extremity Assessment Upper Extremity Assessment: Overall WFL for tasks assessed    Lower Extremity Assessment Lower Extremity Assessment: RLE deficits/detail RLE Deficits / Details: at least 3/5, pt able to SLR without quad lag. Deferred MMT due to pain RLE Sensation: WNL (reports throbbing sensation in R LE)    Cervical / Trunk Assessment Cervical / Trunk Assessment: Normal  Communication   Communication Communication: No apparent difficulties    Cognition Arousal: Alert Behavior During Therapy: WFL for tasks assessed/performed, Flat affect   PT - Cognitive impairments: No apparent impairments    Following commands: Intact       Cueing Cueing Techniques: Verbal cues     General Comments General comments (skin integrity, edema, etc.): Wife present and supportive throughout session    Exercises Total Joint Exercises Ankle Circles/Pumps: AROM, Both, Supine, 5 reps Quad Sets: AROM, Right, Both, Supine Heel Slides: AROM, Right, Supine (2 reps) Hip ABduction/ADduction: AROM, Right, Supine (2 reps) Straight Leg Raises: AROM, Right, 5 reps, Supine   Assessment/Plan    PT Assessment Patient needs continued PT services  PT Problem List  Decreased strength;Decreased range of motion;Decreased activity tolerance;Decreased balance;Decreased mobility;Decreased knowledge of use of DME       PT Treatment Interventions DME instruction;Gait training;Stair training;Functional mobility training;Therapeutic activities;Therapeutic exercise;Balance training;Neuromuscular re-education;Patient/family education    PT Goals (Current goals can be found in the Care Plan section)  Acute Rehab PT Goals Patient Stated Goal: to go home PT Goal Formulation: With patient/family Time For Goal Achievement: 02/10/24 Potential to Achieve Goals: Good    Frequency BID        AM-PAC PT "6 Clicks" Mobility  Outcome Measure Help needed turning from your back to your side while in a flat bed without using bedrails?: A Little Help needed moving from lying on your back to sitting on the side of a flat bed without using bedrails?: A Little Help needed moving to and from a bed to a chair (including a wheelchair)?: A Little Help needed standing up from a chair using your arms (e.g., wheelchair or bedside chair)?: A Little Help needed to walk in hospital room?: A Little Help needed climbing 3-5 steps with a railing? : A Little 6 Click Score: 18    End of Session Equipment Utilized During Treatment: Gait belt;Right knee immobilizer Activity Tolerance: Patient tolerated treatment well Patient left: in bed;with bed alarm set;with call bell/phone within reach;with family/visitor present Nurse Communication: Mobility status PT Visit Diagnosis: Other abnormalities of gait and mobility (R26.89);Muscle weakness (generalized) (M62.81)    Time: 7829-5621 PT Time Calculation (min) (ACUTE ONLY): 25 min   Charges:   PT Evaluation $PT Eval Low Complexity: 1 Low PT Treatments $Gait Training: 8-22 mins PT General Charges $$ ACUTE PT VISIT: 1  Visit         Daniel Hurley, PT, DPT Secure Chat Preferred  Rehab Office [phone]   Arturo Morton [facility]ion [facility] 01/27/2024,  4:30 PM

## 2024-01-27 NOTE — H&P (Addendum)
 TOTAL KNEE ADMISSION H&P  Patient is being admitted for right total knee arthroplasty.  Subjective:  Chief Complaint:right knee pain.  HPI: Daniel Hurley, 68 y.o. male, has a history of pain and functional disability in the right knee due to arthritis and has failed non-surgical conservative treatments for greater than 12 weeks to includeNSAID's and/or analgesics, corticosteriod injections, flexibility and strengthening excercises, and activity modification.  Onset of symptoms was gradual, starting 1 years ago with gradually worsening course since that time. The patient noted no past surgery on the right knee(s).  Patient currently rates pain in the right knee(s) at 8 out of 10 with activity. Patient has night pain, worsening of pain with activity and weight bearing, pain that interferes with activities of daily living, pain with passive range of motion, crepitus, and joint swelling.  Patient has evidence of subchondral sclerosis and joint space narrowing along with significant patellofemoral arthritis as well as high-grade cartilage loss in the medial compartment due to unrepairable radial and complex meniscal tearing on mri and  by imaging studies. This patient has had  a long history of nonoperative treatment and is ready for knee replacement.  No personal or family history of DVT or pulmonary embolism. . There is no active infection.  Patient Active Problem List   Diagnosis Date Noted   Unstable angina [facility])    Chronic neck pain 11/22/2020   Hypertension 02/12/2020   Acute respiratory failure with hypoxia (HCC) 01/20/2016   Chest pain 01/19/2016   COPD exacerbation (HCC) 01/19/2016   Tobacco abuse 01/19/2016   Past Medical History:  Diagnosis Date   COPD (chronic obstructive pulmonary disease) (HCC)    Coronary artery disease    Hypertension 02/12/2020    Past Surgical History:  Procedure Laterality Date   ANKLE FUSION  2006   BACK SURGERY  2008   L5-5 fusion    CORONARY  PRESSURE/FFR STUDY N/A 07/08/2022   Procedure: INTRAVASCULAR PRESSURE WIRE/FFR STUDY;  Surgeon: Kathleene Hazel, MD;  Location: [facility];  Service: Cardiovascular;  Laterality: N/A;   CORONARY STENT INTERVENTION N/A 07/08/2022   Procedure: CORONARY STENT INTERVENTION;  Surgeon: Kathleene Hazel, MD;  Location: [facility];  Service: Cardiovascular;  Laterality: N/A;   INGUINAL HERNIA REPAIR Right 12/27/2023   Procedure: OPEN RIGHT INGUINAL HERNIA REPAIR WITH MESH;  Surgeon: Emelia Loron, MD;  Location[facility];  Service: General;  Laterality: Right;   LEFT HEART CATH AND CORONARY ANGIOGRAPHY N/A 07/08/2022   Procedure: LEFT HEART CATH AND CORONARY ANGIOGRAPHY;  Surgeon: Kathleene Hazel, MD;  Location: [facility];  Service: Cardiovascular;  Laterality: N/A;   left wrist surgery      Current Facility-Administered Medications  Medication Dose Route Frequency Provider Last Rate Last Admin   ceFAZolin (ANCEF) IVPB 2g/100 mL premix  2 g Intravenous On Call to OR Magnant, Charles L, PA-C       povidone-iodine 10 % swab 2 Application  2 Application Topical Once Magnant, Charles L, PA-C       tranexamic acid (CYKLOKAPRON) 2,000 mg in sodium chloride 0.9 % 50 mL Topical Application  2,000 mg Topical To OR Magnant, Charles L, PA-C       tranexamic acid (CYKLOKAPRON) IVPB 1,000 mg  1,000 mg Intravenous To OR Magnant, Charles L, PA-C       No Known Allergies  Social History   Tobacco Use   Smoking status: Every Day    Current packs/day: 1.50  Types: Cigarettes   Smokeless tobacco: Never  Substance Use Topics   Alcohol use: No    No family history on file.   Review of Systems  Musculoskeletal:  Positive for arthralgias.  All other systems reviewed and are negative.   Objective:  Physical Exam Vitals reviewed.  HENT:     Head: Normocephalic.     Nose: Nose normal.     Mouth/Throat:     Mouth: Mucous membranes are moist.  Eyes:      Pupils: Pupils are equal, round, and reactive to light.  Cardiovascular:     Rate and Rhythm: Normal rate.     Pulses: Normal pulses.  Pulmonary:     Effort: Pulmonary effort is normal.  Abdominal:     General: Abdomen is flat.  Musculoskeletal:     Cervical back: Normal range of motion.  Skin:    General: Skin is warm.     Capillary Refill: Capillary refill takes less than 2 seconds.  Neurological:     General: No focal deficit present.     Mental Status: He is alert.  Psychiatric:        Mood and Affect: Mood normal.   Examination demonstrates mild right knee effusion with intact extensor mechanism. Patient lacks about 3 degrees of full extension but has full flexion. Has patellofemoral crepitus bilaterally as well as medial greater than lateral joint line tenderness on the right. No other masses lymphadenopathy or skin changes noted in that right knee region. No groin pain with internal/external rotation of the leg. Pedal pulses palpable. Foot dorsiflexion intact   Vital signs in last 24 hours: Temp:  [97.6 F (36.4 C)] 97.6 F (36.4 C) (03/20 0551) Pulse Rate:  [76] 76 (03/20 0551) Resp:  [18] 18 (03/20 0551) BP: (141)/(95) 141/95 (03/20 0551) SpO2:  [95 %] 95 % (03/20 0551) Weight:  [82.1 kg] 82.1 kg (03/20 0551)  Labs:   Estimated body mass index is 27.52 kg/m as calculated from the following:   Height as of this encounter: 5\' 8"  (1.727 m).   Weight as of this encounter: 82.1 kg.   Imaging Review Plain radiographs demonstrate moderate degenerative joint disease of the right knee(s). The overall alignment ismild varus. The bone quality appears to be good for age and reported activity level. MRI scan shows significant medial meniscal pathology with radial tear and complex tearing of the medial meniscus with high-grade cartilage loss in that medial compartment along with moderate      Assessment/Plan:  End stage arthritis, right knee   The patient history,  physical examination, clinical judgment of the provider and imaging studies are consistent with end stage degenerative joint disease of the right knee(s) and total knee arthroplasty is deemed medically necessary. The treatment options including medical management, injection therapy arthroscopy and arthroplasty were discussed at length. The risks and benefits of total knee arthroplasty were presented and reviewed. The risks due to aseptic loosening, infection, stiffness, patella tracking problems, thromboembolic complications and other imponderables were discussed. The patient acknowledged the explanation, agreed to proceed with the plan and consent was signed. Patient is being admitted for inpatient treatment for surgery, pain control, PT, OT, prophylactic antibiotics, VTE prophylaxis, progressive ambulation and ADL's and discharge planning. The patient is planning to be discharged home with home health services Plavix held prior to surgery but aspirin continued low-dose through the perioperative period per cardiac risk stratification recommendations     Patient's anticipated LOS is less than 2 midnights, meeting these requirements: -  Younger than 49 - Lives within 1 hour of care - Has a competent adult at home to recover with post-op recover - NO history of  - Chronic pain requiring opiods  - Diabetes  - Coronary Artery Disease  - Heart failure  - Heart attack  - Stroke  - DVT/VTE  - Cardiac arrhythmia  - Respiratory Failure/COPD  - Renal failure  - Anemia  - Advanced Liver disease

## 2024-01-27 NOTE — Consult Note (Signed)
 CARDIOLOGY CONSULT NOTE       Patient ID: Daniel Hurley MRN: [id number] DOB/AGE: September 12, 1956 68 y.o.  Admit date: 01/27/2024 Referring Physician: Renold Don Primary Physician: Lavada Mesi, MD Primary Cardiologist: Eden Emms Reason for Consultation: Preoperative  Active Problems:   * No active hospital problems. *   HPI:  67 y.o. in short stay for right TKR with Dr August Saucer. History of CAD Had stent to LAD 07/08/22 without significant CAD in RCA/LCX. No angina Had normal myovue 12/23/22 His plavix was d/c 09/2023 due to bleeding and recurrent cat scratches. He continues to smoke 1 ppd and has chronic bronchitis with cough and COPD. Non contrast chest CT 05/07/23 with no lung cancer/nodules. He has a lot of recent stress settling his moms estate. Had no issues with anesthesia and surgery for right inguinal hernia repair with Dr Dwain Sarna 12/27/23. He is able to do > 5 Mets and all ADL's  EF normal on myovue 12/23/22.  52%   ROS All other systems reviewed and negative except as noted above  Past Medical History:  Diagnosis Date   COPD (chronic obstructive pulmonary disease) (HCC)    Coronary artery disease    Hypertension 02/12/2020    No family history on file.  Social History   Socioeconomic History   Marital status: Married    Spouse name: Not on file   Number of children: Not on file   Years of education: Not on file   Highest education level: Not on file  Occupational History   Not on file  Tobacco Use   Smoking status: Every Day    Current packs/day: 1.50    Types: Cigarettes   Smokeless tobacco: Never  Vaping Use   Vaping status: Never Used  Substance and Sexual Activity   Alcohol use: No   Drug use: No   Sexual activity: Not Currently  Other Topics Concern   Not on file  Social History Narrative   Not on file   Social Drivers of Health   Financial Resource Strain: Not on file  Food Insecurity: Not on file  Transportation Needs: Not on file  Physical Activity:  Not on file  Stress: Not on file  Social Connections: Not on file  Intimate Partner Violence: Not on file    Past Surgical History:  Procedure Laterality Date   ANKLE FUSION  2006   BACK SURGERY  2008   L5-5 fusion    CORONARY PRESSURE/FFR STUDY N/A 07/08/2022   Procedure: INTRAVASCULAR PRESSURE WIRE/FFR STUDY;  Surgeon: Kathleene Hazel, MD;  Location: [facility];  Service: Cardiovascular;  Laterality: N/A;   CORONARY STENT INTERVENTION N/A 07/08/2022   Procedure: CORONARY STENT INTERVENTION;  Surgeon: Kathleene Hazel, MD;  Location: [facility];  Service: Cardiovascular;  Laterality: N/A;   INGUINAL HERNIA REPAIR Right 12/27/2023   Procedure: OPEN RIGHT INGUINAL HERNIA REPAIR WITH MESH;  Surgeon: Emelia Loron, MD;  Location: [facility] OR;  Service: General;  Laterality: Right;   LEFT HEART CATH AND CORONARY ANGIOGRAPHY N/A 07/08/2022   Procedure: LEFT HEART CATH AND CORONARY ANGIOGRAPHY;  Surgeon: Kathleene Hazel, MD;  Location: [facility];  Service: Cardiovascular;  Laterality: N/A;   left wrist surgery        Current Facility-Administered Medications:    ceFAZolin (ANCEF) IVPB 2g/100 mL premix, 2 g, Intravenous, On Call to OR, Magnant, Charles L, PA-C   povidone-iodine 10 % swab 2 Application, 2 Application, Topical, Once, Magnant, Joycie Peek, PA-C  tranexamic acid (CYKLOKAPRON) 2,000 mg in sodium chloride 0.9 % 50 mL Topical Application, 2,000 mg, Topical, To OR, Magnant, Charles L, PA-C   tranexamic acid (CYKLOKAPRON) IVPB 1,000 mg, 1,000 mg, Intravenous, To OR, Magnant, Charles L, PA-C  povidone-iodine  2 Application Topical Once   tranexamic acid (CYKLOKAPRON) 2,000 mg in sodium chloride 0.9 % 50 mL Topical Application  2,000 mg Topical To OR     ceFAZolin (ANCEF) IV     tranexamic acid      Physical Exam: Blood pressure (!) 141/95, pulse 76, temperature 97.6 F (36.4 C), temperature source Oral, resp. rate 18, height 5\' 8"  (1.727  m), weight 82.1 kg, SpO2 95%.    No distress Bronchitic cough No murmur No bruit Abdomen benign Right inguinal hernia repair No edema Palpable pedal pulses   Labs:   Lab Results  Component Value Date   WBC 10.9 (H) 01/19/2024   HGB 15.4 01/19/2024   HCT 47.1 01/19/2024   MCV 93.8 01/19/2024   PLT 287 01/19/2024   No results for input(s): "NA", "K", "CL", "CO2", "BUN", "CREATININE", "CALCIUM", "PROT", "BILITOT", "ALKPHOS", "ALT", "AST", "GLUCOSE" in the last 168 hours.  Invalid input(s): "LABALBU" Lab Results  Component Value Date   TROPONINI <0.03 01/19/2016    Lab Results  Component Value Date   CHOL 140 07/06/2022   CHOL 170 02/12/2020   CHOL 189 01/20/2016   Lab Results  Component Value Date   HDL 40 07/06/2022   HDL 48 02/12/2020   HDL 57 01/20/2016   Lab Results  Component Value Date   LDLCALC 84 07/06/2022   LDLCALC 102 (H) 02/12/2020   LDLCALC 115 (H) 01/20/2016   Lab Results  Component Value Date   TRIG 80 07/06/2022   TRIG 102 02/12/2020   TRIG 84 01/20/2016   Lab Results  Component Value Date   CHOLHDL 3.5 07/06/2022   CHOLHDL 3.5 02/12/2020   CHOLHDL 3.3 01/20/2016   No results found for: "LDLDIRECT"    Radiology: No results found.  EKG: NSR normal 12/22/23   ASSESSMENT AND PLAN:   Preoperative;  Clear to have right total knee replacement with Dr August Saucer. DAT stopped 09/2023. Single vessel CAD with stent to LAD  07/08/22. Normal myovue 2/14/2. Normal EF 52% Normal ECG and no angina. Continue ASA and statin BP slightly elevated can give additional 5 mg Norvasc post op if needed May need albuterol post op as he continues to smoke and has chronic bronchitic cough. Lung cancer CT negative 05/04/23.    Signed: Charlton Haws 01/27/2024, 8:13 AM

## 2024-01-27 NOTE — Anesthesia Procedure Notes (Signed)
 Spinal  Patient location during procedure: OR Start time: 01/27/2024 9:20 AM End time: 01/27/2024 9:26 AM Reason for block: surgical anesthesia Staffing Performed: anesthesiologist  Anesthesiologist: Lewie Loron, MD Performed by: Lewie Loron, MD Authorized by: Lewie Loron, MD   Preanesthetic Checklist Completed: patient identified, IV checked, site marked, risks and benefits discussed, surgical consent, monitors and equipment checked, pre-op evaluation and timeout performed Spinal Block Patient position: sitting Prep: DuraPrep and site prepped and draped Patient monitoring: heart rate, continuous pulse ox and blood pressure Approach: right paramedian Location: L2-3 Injection technique: single-shot Needle Needle type: Spinocan  Needle gauge: 25 G Needle length: 9 cm Additional Notes Expiration date of kit checked and confirmed. Patient tolerated procedure well, without complications.

## 2024-01-27 NOTE — Care Management (Signed)
 Received call from floor Nurse concerning patient being discharged home, patient needing HH arranged.  Reviewed chart and noted [facility] on AVS.  Faxed in referral in to [facility] after hours [phone]. Updated Floor Nurse.

## 2024-01-27 NOTE — Progress Notes (Signed)
 AVS reviewed with Chrissie Noa, wife and daughter who were all present for AVS education. Follow up appointments, aware that [facility] will contact, knows to pick up prescriptions at the pharmacy listed, educated about aspirin and to get over the counter, ice man taken and instructions on use reviewed, knee immobilizer on, educated to remove compression wrap tomorrow, aware to not apply lotions or creams, aware not to submerge in water, signs and symptoms of infection educated, reasons to contact MD or call 911. Lorin and wife verbalized understanding of all education, all personal belongings taken. Transport chair to lobby by Consulting civil engineer for discharge home.

## 2024-01-27 NOTE — Brief Op Note (Signed)
   01/27/2024  12:13 PM  PATIENT:  Daniel Hurley  68 y.o. male  PRE-OPERATIVE DIAGNOSIS:  right knee osteoarthritis  POST-OPERATIVE DIAGNOSIS:  right knee osteoarthritis  PROCEDURE:  Procedure(s): RIGHT TOTAL KNEE ARTHROPLASTY  SURGEON:  Surgeon(s): Cammy Copa, MD  ASSISTANT: magnant pa  ANESTHESIA:   spinal  EBL: 75 ml    No intake/output data recorded.  BLOOD ADMINISTERED: none  DRAINS: none   LOCAL MEDICATIONS USED:  none  SPECIMEN:  No Specimen  COUNTS:  YES  TOURNIQUET:   Total Tourniquet Time Documented: Thigh (Right) - 81 minutes Total: Thigh (Right) - 81 minutes   DICTATION: .Other Dictation: Dictation Number [id number]  PLAN OF CARE: Admit for overnight observation  PATIENT DISPOSITION:  PACU - hemodynamically stable

## 2024-01-27 NOTE — Anesthesia Procedure Notes (Signed)
 Anesthesia Regional Block: Adductor canal block   Pre-Anesthetic Checklist: , timeout performed,  Correct Patient, Correct Site, Correct Laterality,  Correct Procedure, Correct Position, site marked,  Risks and benefits discussed,  Surgical consent,  Pre-op evaluation,  At surgeon's request and post-op pain management  Laterality: Lower and Right  Prep: chloraprep       Needles:  Injection technique: Single-shot  Needle Type: Stimiplex     Needle Length: 9cm  Needle Gauge: 21     Additional Needles:   Procedures:,,,, ultrasound used (permanent image in chart),,    Narrative:  Start time: 01/27/2024 8:38 AM End time: 01/27/2024 8:58 AM Injection made incrementally with aspirations every 5 mL.  Performed by: Personally  Anesthesiologist: Lewie Loron, MD  Additional Notes: BP cuff, EKG monitors applied. Sedation begun. Artery and nerve location verified with ultrasound. Anesthetic injected incrementally (5ml), slowly, and after negative aspirations under direct u/s guidance. Good fascial/perineural spread. Tolerated well.

## 2024-01-27 NOTE — Discharge Instructions (Signed)
 Remove Ace-wrap tomorrow. You may shower, dressing is waterproof.  Do not remove the dressing, we will remove it at your first post-op appointment.  Do not take a bath or soak the knee in a tub or pool.  You may weightbear as you can tolerate on the operative leg with a walker.  Continue using the CPM machine 3 times per day for one hour each time, increasing the degrees of range of motion daily.  Use the blue cradle boot under your heel to work on getting your leg straight.  Do NOT put a pillow under your knee.  You will follow-up with Dr. August Saucer or Drema Pry in the clinic in 2 weeks at your given appointment date.    INSTRUCTIONS AFTER JOINT REPLACEMENT   Remove items at home which could result in a fall. This includes throw rugs or furniture in walking pathways  ICE to the affected joint every three hours while awake for 30 minutes at a time, for at least the first 3-5 days, and then as needed for pain and swelling.  Continue to use ice for pain and swelling. You may notice swelling that will progress down to the foot and ankle.  This is normal after surgery.  Elevate your leg when you are not up walking on it.   Continue to use the breathing machine you got in the hospital (incentive spirometer) which will help keep your temperature down.  It is common for your temperature to cycle up and down following surgery, especially at night when you are not up moving around and exerting yourself.  The breathing machine keeps your lungs expanded and your temperature down.    DIET:  As you were doing prior to hospitalization, we recommend a well-balanced diet.   DRESSING / WOUND CARE / SHOWERING   Keep the surgical dressing until follow up.  The dressing is water proof, so you can shower without any extra covering.  IF THE DRESSING FALLS OFF or the wound gets wet inside, change the dressing with sterile gauze.  Please use good hand washing techniques before changing the dressing.  Do not use any lotions or  creams on the incision until instructed by your surgeon.    ACTIVITY   Increase activity slowly as tolerated, but follow the weight bearing instructions below.   No driving for 6 weeks or until further direction given by your physician.  You cannot drive while taking narcotics.  No lifting or carrying greater than 10 lbs. until further directed by your surgeon.  Avoid periods of inactivity such as sitting longer than an hour when not asleep. This helps prevent blood clots.  You may return to work once you are authorized by your doctor.     WEIGHT BEARING   Weight bearing as tolerated with assist device (walker, cane, etc) as directed, use it as long as suggested by your surgeon or therapist, typically at least 4-6 weeks.    EXERCISES   Results after joint replacement surgery are often greatly improved when you follow the exercise, range of motion and muscle strengthening exercises prescribed by your doctor. Safety measures are also important to protect the joint from further injury. Any time any of these exercises cause you to have increased pain or swelling, decrease what you are doing until you are comfortable again and then slowly increase them. If you have problems or questions, call your caregiver or physical therapist for advice.   Rehabilitation is important following a joint replacement. After just a  few days of immobilization, the muscles of the leg can become weakened and shrink (atrophy).  These exercises are designed to build up the tone and strength of the thigh and leg muscles and to improve motion. Often times heat used for twenty to thirty minutes before working out will loosen up your tissues and help with improving the range of motion but do not use heat for the first two weeks following surgery (sometimes heat can increase post-operative swelling).   These exercises can be done on a training (exercise) mat, on the floor, on a table or on a bed. Use whatever works the best  and is most comfortable for you.    Use music or television while you are exercising so that the exercises are a pleasant break in your day. This will make your life better with the exercises acting as a break in your routine that you can look forward to.   Perform all exercises about fifteen times, three times per day or as directed.  You should exercise both the operative leg and the other leg as well.   Exercises include:    Quad Sets - Tighten up the muscle on the front of the thigh (Quad) and hold for 5-10 seconds.   Straight Leg Raises - With your knee straight (if you were given a brace, keep it on), lift the leg to 60 degrees, hold for 3 seconds, and slowly lower the leg.  Perform this exercise against resistance later as your leg gets stronger.  Leg Slides: Lying on your back, slowly slide your foot toward your buttocks, bending your knee up off the floor (only go as far as is comfortable). Then slowly slide your foot back down until your leg is flat on the floor again.  Angel Wings: Lying on your back spread your legs to the side as far apart as you can without causing discomfort.  Hamstring Strength:  Lying on your back, push your heel against the floor with your leg straight by tightening up the muscles of your buttocks.  Repeat, but this time bend your knee to a comfortable angle, and push your heel against the floor.  You may put a pillow under the heel to make it more comfortable if necessary.   A rehabilitation program following joint replacement surgery can speed recovery and prevent re-injury in the future due to weakened muscles. Contact your doctor or a physical therapist for more information on knee rehabilitation.    CONSTIPATION   Constipation is defined medically as fewer than three stools per week and severe constipation as less than one stool per week.  Even if you have a regular bowel pattern at home, your normal regimen is likely to be disrupted due to multiple reasons  following surgery.  Combination of anesthesia, postoperative narcotics, change in appetite and fluid intake all can affect your bowels.   YOU MUST use at least one of the following options; they are listed in order of increasing strength to get the job done.  They are all available over the counter, and you may need to use some, POSSIBLY even all of these options:    Drink plenty of fluids (prune juice may be helpful) and high fiber foods  Colace 100 mg by mouth twice a day  Senokot for constipation as directed and as needed Dulcolax (bisacodyl), take with full glass of water  Miralax (polyethylene glycol) once or twice a day as needed.   If you have tried all these things and  are unable to have a bowel movement in the first 3-4 days after surgery call either your surgeon or your primary doctor.    If you experience loose stools or diarrhea, hold the medications until you stool forms back up.  If your symptoms do not get better within 1 week or if they get worse, check with your doctor.  If you experience "the worst abdominal pain ever" or develop nausea or vomiting, please contact the office immediately for further recommendations for treatment.    ITCHING:  If you experience itching with your medications, try taking only a single pain pill, or even half a pain pill at a time.  You can also use Benadryl over the counter for itching or also to help with sleep.   TED HOSE STOCKINGS:  Use stockings on both legs until for at least 2 weeks or as directed by physician office. They may be removed at night for sleeping.   MEDICATIONS:  See your medication summary on the "After Visit Summary" that nursing will review with you.  You may have some home medications which will be placed on hold until you complete the course of blood thinner medication.  It is important for you to complete the blood thinner medication as prescribed.   PRECAUTIONS:  If you experience chest pain or shortness of breath - call  [phone] immediately for transfer to the hospital emergency department.   If you develop a fever greater that 101 F, purulent drainage from wound, increased redness or drainage from wound, foul odor from the wound/dressing, or calf pain - CONTACT YOUR SURGEON.                                                   FOLLOW-UP APPOINTMENTS:  If you do not already have a post-op appointment, please call the office for an appointment to be seen by your surgeon.  Guidelines for how soon to be seen are listed in your "After Visit Summary", but are typically between 1-4 weeks after surgery.   OTHER INSTRUCTIONS:   Knee Replacement:  Do not place pillow under knee, focus on keeping the knee straight while resting. CPM instructions: 0-90 degrees, 2 hours in the morning, 2 hours in the afternoon, and 2 hours in the evening. Place foam block, curve side up under heel at all times except when in CPM or when walking.  DO NOT modify, tear, cut, or change the foam block in any way.   POST-OPERATIVE OPIOID TAPER INSTRUCTIONS:  It is important to wean off of your opioid medication as soon as possible. If you do not need pain medication after your surgery it is ok to stop day one.  Opioids include:  Codeine, Hydrocodone(Norco, Vicodin), Oxycodone(Percocet, oxycontin) and hydromorphone amongst others.  Long term and even short term use of opiods can cause:  Increased pain response  Dependence  Constipation  Depression  Respiratory depression  And more.  Withdrawal symptoms can include  Flu like symptoms  Nausea, vomiting  And more  Techniques to manage these symptoms  Hydrate well  Eat regular healthy meals  Stay active  Use relaxation techniques(deep breathing, meditating, yoga)  Do Not substitute Alcohol to help with tapering  If you have been on opioids for less than two weeks and do not have pain than it is ok to stop all together.  Plan to wean off of opioids  This plan should start within one week post op  of your joint replacement.  Maintain the same interval or time between taking each dose and first decrease the dose.  Cut the total daily intake of opioids by one tablet each day  Next start to increase the time between doses.  The last dose that should be eliminated is the evening dose.   MAKE SURE YOU:  Understand these instructions.  Get help right away if you are not doing well or get worse.    Thank you for letting us be a part of your medical care team.  It is a privilege we respect greatly.  We hope these instructions will help you stay on track for a fast and full recovery!    Dental Antibiotics:   In most cases prophylactic antibiotics for Dental procdeures after total joint surgery are not necessary.   Exceptions are as follows:   1. History of prior total joint infection   2. Severely immunocompromised (Organ Transplant, cancer chemotherapy, Rheumatoid biologic  meds such as Humera)   3. Poorly controlled diabetes (A1C &gt; 8.0, blood glucose over 200)   If you have one of these conditions, contact your surgeon for an antibiotic prescription, prior to your  dental procedure.

## 2024-01-27 NOTE — Transfer of Care (Signed)
 Immediate Anesthesia Transfer of Care Note  Patient: Daniel Hurley  Procedure(s) Performed: RIGHT TOTAL KNEE ARTHROPLASTY (Right: Knee)  Patient Location: PACU  Anesthesia Type:General  Level of Consciousness: awake, alert , and oriented  Airway & Oxygen Therapy: Patient Spontanous Breathing  Post-op Assessment: Report given to RN and Post -op Vital signs reviewed and stable  Post vital signs: Reviewed and stable  Last Vitals:  Vitals Value Taken Time  BP 117/99 01/27/24 1209  Temp    Pulse 75 01/27/24 1212  Resp 16 01/27/24 1212  SpO2 92 % 01/27/24 1212  Vitals shown include unfiled device data.  Last Pain:  Vitals:   01/27/24 0604  TempSrc:   PainSc: 0-No pain         Complications: No notable events documented.

## 2024-01-27 NOTE — Progress Notes (Signed)
  Subjective:  Patient stable.  Pain controlled.  Is able to do a straight leg raise.  Did well with physical therapy   Objective: Vital signs in last 24 hours: Temp:  [97.5 F (36.4 C)-98.4 F (36.9 C)] 98.4 F (36.9 C) (03/20 1630) Pulse Rate:  [59-78] 78 (03/20 1630) Resp:  [11-18] 18 (03/20 1630) BP: (111-141)/(68-99) 112/79 (03/20 1630) SpO2:  [93 %-98 %] 93 % (03/20 1630) Weight:  [82.1 kg] 82.1 kg (03/20 0551)  Intake/Output from previous day: No intake/output data recorded. Intake/Output this shift: Total I/O In: 1420 [P.O.:120; I.V.:1300] Out: 50 [Blood:50]  Exam:  Intact pulses distally Dorsiflexion/Plantar flexion intact  Labs: No results for input(s): "HGB" in the last 72 hours. No results for input(s): "WBC", "RBC", "HCT", "PLT" in the last 72 hours. No results for input(s): "NA", "K", "CL", "CO2", "BUN", "CREATININE", "GLUCOSE", "CALCIUM" in the last 72 hours. No results for input(s): "LABPT", "INR" in the last 72 hours.  Assessment/Plan: Plan at this time is to discharge to home.  Patient did receive 1 dose of IV. antibiotics postop.  Doing well with therapy.  Plan is to discharge home today.  CPM to start tomorrow 1 hour 3 times a day.  Keep knee immobilizer on when ambulating until he can do 10 straight leg raises.  Follow-up with [place] in 2 weeks.  Okay to shower with dressing.  Aspirin for DVT prophylaxis.  Hold on Plavix until first postop appointment   Burnard Bunting 01/27/2024, 5:32 PM

## 2024-01-28 ENCOUNTER — Encounter (HOSPITAL_COMMUNITY): Payer: Self-pay | Admitting: Orthopedic Surgery

## 2024-01-28 NOTE — Anesthesia Postprocedure Evaluation (Signed)
 Anesthesia Post Note  Patient: Daniel Hurley  Procedure(s) Performed: RIGHT TOTAL KNEE ARTHROPLASTY (Right: Knee)     Patient location during evaluation: PACU Anesthesia Type: Spinal Level of consciousness: awake and alert Pain management: pain level controlled Vital Signs Assessment: post-procedure vital signs reviewed and stable Respiratory status: spontaneous breathing Cardiovascular status: blood pressure returned to baseline and stable Postop Assessment: no apparent nausea or vomiting Anesthetic complications: no   No notable events documented.  Last Vitals:  Vitals:   01/27/24 1307 01/27/24 1630  BP: 122/81 112/79  Pulse: (!) 59 78  Resp: 18 18  Temp: 36.4 C 36.9 C  SpO2: 97% 93%    Last Pain:  Vitals:   01/27/24 1645  TempSrc:   PainSc: 3                  Lewie Loron

## 2024-01-30 DIAGNOSIS — M1711 Unilateral primary osteoarthritis, right knee: Secondary | ICD-10-CM

## 2024-02-06 NOTE — Discharge Summary (Signed)
 Physician Discharge Summary      Patient ID: Daniel Hurley MRN: [id number] DOB/AGE: [birth year] 68 y.o.  Admit date: 01/27/2024 Discharge date: 01/27/2024  Admission Diagnoses:  Principal Problem:   S/P total knee replacement, right Active Problems:   Arthritis of right knee   Discharge Diagnoses:  Same  Surgeries: Procedure(s): RIGHT TOTAL KNEE ARTHROPLASTY on 01/27/2024   Consultants:   Discharged Condition: Stable  Hospital Course: Daniel Hurley is an 68 y.o. male who was admitted 01/27/2024 with a chief complaint of right knee pain, and found to have a diagnosis of right knee arthritis.  They were brought to the operating room on 01/27/2024 and underwent the above named procedures.  Pt awoke from anesthesia without complication and was transferred to the floor. On POD0, patient mobilize well with physical therapy.  His pain was well-controlled and he had no red flag signs or symptoms.  He was discharged home on the day of surgery and will follow-up in 2 weeks.  Pt will f/u with Dr. August Saucer in clinic in ~2 weeks.   Antibiotics given:  Anti-infectives (From admission, onward)    Start     Dose/Rate Route Frequency Ordered Stop   01/27/24 1800  ceFAZolin (ANCEF) IVPB 2g/100 mL premix  Status:  Discontinued        2 g 200 mL/hr over 30 Minutes Intravenous Every 8 hours 01/27/24 1310 01/27/24 2339   01/27/24 1053  vancomycin (VANCOCIN) powder  Status:  Discontinued          As needed 01/27/24 1053 01/27/24 1205   01/27/24 0600  ceFAZolin (ANCEF) IVPB 2g/100 mL premix        2 g 200 mL/hr over 30 Minutes Intravenous On call to O.R. 01/27/24 6578 01/27/24 0939     .  Recent vital signs:  Vitals:   01/27/24 1307 01/27/24 1630  BP: 122/81 112/79  Pulse: (!) 59 78  Resp: 18 18  Temp: 97.6 F (36.4 C) 98.4 F (36.9 C)  SpO2: 97% 93%    Recent laboratory studies:  Results for orders placed or performed during the hospital encounter of 01/19/24  Surgical pcr screen    Collection Time: 01/19/24 10:03 AM   Specimen: Nasal Mucosa; Nasal Swab  Result Value Ref Range   MRSA, PCR NEGATIVE NEGATIVE   Staphylococcus aureus NEGATIVE NEGATIVE  Urinalysis, w/ Reflex to Culture (Infection Suspected) -Urine, Clean Catch   Collection Time: 01/19/24 10:03 AM  Result Value Ref Range   Specimen Source URINE, CLEAN CATCH    Color, Urine YELLOW YELLOW   APPearance CLEAR CLEAR   Specific Gravity, Urine 1.010 1.005 - 1.030   pH 7.0 5.0 - 8.0   Glucose, UA NEGATIVE NEGATIVE mg/dL   Hgb urine dipstick NEGATIVE NEGATIVE   Bilirubin Urine NEGATIVE NEGATIVE   Ketones, ur NEGATIVE NEGATIVE mg/dL   Protein, ur NEGATIVE NEGATIVE mg/dL   Nitrite NEGATIVE NEGATIVE   Leukocytes,Ua TRACE (A) NEGATIVE   RBC / HPF 0-5 0 - 5 RBC/hpf   WBC, UA 0-5 0 - 5 WBC/hpf   Bacteria, UA NONE SEEN NONE SEEN   Squamous Epithelial / HPF 0-5 0 - 5 /HPF  CBC   Collection Time: 01/19/24 10:30 AM  Result Value Ref Range   WBC 10.9 (H) 4.0 - 10.5 K/uL   RBC 5.02 4.22 - 5.81 MIL/uL   Hemoglobin 15.4 13.0 - 17.0 g/dL   HCT 46.9 62.9 - 52.8 %   MCV 93.8 80.0 - 100.0 fL   MCH  30.7 26.0 - 34.0 pg   MCHC 32.7 30.0 - 36.0 g/dL   RDW 78.4 69.6 - 29.5 %   Platelets 287 150 - 400 K/uL   nRBC 0.0 0.0 - 0.2 %  Basic metabolic panel   Collection Time: 01/19/24 10:30 AM  Result Value Ref Range   Sodium 140 135 - 145 mmol/L   Potassium 3.7 3.5 - 5.1 mmol/L   Chloride 106 98 - 111 mmol/L   CO2 25 22 - 32 mmol/L   Glucose, Bld 94 70 - 99 mg/dL   BUN 12 8 - 23 mg/dL   Creatinine, Ser 2.84 0.61 - 1.24 mg/dL   Calcium 9.4 8.9 - 13.2 mg/dL   GFR, Estimated >44 >01 mL/min   Anion gap 9 5 - 15    Discharge Medications:   Allergies as of 01/27/2024   No Known Allergies      Medication List     STOP taking these medications    clopidogrel 75 MG tablet Commonly known as: PLAVIX   HYDROcodone-acetaminophen 7.5-325 MG tablet Commonly known as: NORCO       TAKE these medications    albuterol  108 (90 Base) MCG/ACT inhaler Commonly known as: VENTOLIN HFA Inhale 1 puff into the lungs as needed for wheezing or shortness of breath.   amLODipine 5 MG tablet Commonly known as: NORVASC Take 1 tablet (5 mg total) by mouth daily.   aspirin EC 81 MG tablet Take 1 tablet (81 mg total) by mouth 2 (two) times daily. Swallow whole. What changed: when to take this   baclofen 10 MG tablet Commonly known as: LIORESAL Take 10 mg by mouth daily as needed for muscle spasms.   docusate sodium 100 MG capsule Commonly known as: COLACE Take 1 capsule (100 mg total) by mouth 2 (two) times daily.   FERROUS SULFATE CR PO Take 27 mg by mouth daily.   nitroGLYCERIN 0.4 MG SL tablet Commonly known as: NITROSTAT Dissolve 1 tablet under the tongue every 5 minutes as needed for chest pain. Max of 3 doses, then 911.   oxyCODONE 5 MG immediate release tablet Commonly known as: Oxy IR/ROXICODONE Take 1 tablet (5 mg total) by mouth every 4 (four) hours as needed for moderate pain (pain score 4-6) (pain score 4-6). What changed:  when to take this reasons to take this   rosuvastatin 40 MG tablet Commonly known as: Crestor Take 1 tablet (40 mg total) by mouth daily.   sildenafil 20 MG tablet Commonly known as: REVATIO Take 1 tablet (20 mg total) by mouth 2 (two) times daily. What changed:  when to take this reasons to take this   Trelegy Ellipta 200-62.5-25 MCG/ACT Aepb Generic drug: Fluticasone-Umeclidin-Vilant Inhale 1 puff into the lungs daily.        Diagnostic Studies: No results found.  Disposition: Discharge disposition: 01-Home or Self Care       Discharge Instructions     Call MD / Call 911   Complete by: As directed    If you experience chest pain or shortness of breath, CALL 911 and be transported to the hospital emergency room.  If you develope a fever above 101 F, pus (white drainage) or increased drainage or redness at the wound, or calf pain, call your surgeon's  office.   Constipation Prevention   Complete by: As directed    Drink plenty of fluids.  Prune juice may be helpful.  You may use a stool softener, such as Colace (over  the counter) 100 mg twice a day.  Use MiraLax (over the counter) for constipation as needed.   Diet - low sodium heart healthy   Complete by: As directed    Discharge instructions   Complete by: As directed    Remove Ace-wrap tomorrow. You may shower, dressing is waterproof.  Do not remove the dressing, we will remove it at your first post-op appointment.  Do not take a bath or soak the knee in a tub or pool.  You may weightbear as you can tolerate on the operative leg with a walker.  Continue using the CPM machine 3 times per day for one hour each time, increasing the degrees of range of motion daily.  Use the blue cradle boot under your heel to work on getting your leg straight.  Do NOT put a pillow under your knee.  You will follow-up with Dr. August Saucer or Drema Pry in the clinic in 2 weeks at your given appointment date.    INSTRUCTIONS AFTER JOINT REPLACEMENT   Remove items at home which could result in a fall. This includes throw rugs or furniture in walking pathways ICE to the affected joint every three hours while awake for 30 minutes at a time, for at least the first 3-5 days, and then as needed for pain and swelling.  Continue to use ice for pain and swelling. You may notice swelling that will progress down to the foot and ankle.  This is normal after surgery.  Elevate your leg when you are not up walking on it.   Continue to use the breathing machine you got in the hospital (incentive spirometer) which will help keep your temperature down.  It is common for your temperature to cycle up and down following surgery, especially at night when you are not up moving around and exerting yourself.  The breathing machine keeps your lungs expanded and your temperature down.   DIET:  As you were doing prior to hospitalization, we recommend  a well-balanced diet.  DRESSING / WOUND CARE / SHOWERING  Keep the surgical dressing until follow up.  The dressing is water proof, so you can shower without any extra covering.  IF THE DRESSING FALLS OFF or the wound gets wet inside, change the dressing with sterile gauze.  Please use good hand washing techniques before changing the dressing.  Do not use any lotions or creams on the incision until instructed by your surgeon.    ACTIVITY  Increase activity slowly as tolerated, but follow the weight bearing instructions below.   No driving for 6 weeks or until further direction given by your physician.  You cannot drive while taking narcotics.  No lifting or carrying greater than 10 lbs. until further directed by your surgeon. Avoid periods of inactivity such as sitting longer than an hour when not asleep. This helps prevent blood clots.  You may return to work once you are authorized by your doctor.     WEIGHT BEARING   Weight bearing as tolerated with assist device (walker, cane, etc) as directed, use it as long as suggested by your surgeon or therapist, typically at least 4-6 weeks.   EXERCISES  Results after joint replacement surgery are often greatly improved when you follow the exercise, range of motion and muscle strengthening exercises prescribed by your doctor. Safety measures are also important to protect the joint from further injury. Any time any of these exercises cause you to have increased pain or swelling, decrease what you are  doing until you are comfortable again and then slowly increase them. If you have problems or questions, call your caregiver or physical therapist for advice.   Rehabilitation is important following a joint replacement. After just a few days of immobilization, the muscles of the leg can become weakened and shrink (atrophy).  These exercises are designed to build up the tone and strength of the thigh and leg muscles and to improve motion. Often times heat  used for twenty to thirty minutes before working out will loosen up your tissues and help with improving the range of motion but do not use heat for the first two weeks following surgery (sometimes heat can increase post-operative swelling).   These exercises can be done on a training (exercise) mat, on the floor, on a table or on a bed. Use whatever works the best and is most comfortable for you.    Use music or television while you are exercising so that the exercises are a pleasant break in your day. This will make your life better with the exercises acting as a break in your routine that you can look forward to.   Perform all exercises about fifteen times, three times per day or as directed.  You should exercise both the operative leg and the other leg as well.  Exercises include:   Quad Sets - Tighten up the muscle on the front of the thigh (Quad) and hold for 5-10 seconds.   Straight Leg Raises - With your knee straight (if you were given a brace, keep it on), lift the leg to 60 degrees, hold for 3 seconds, and slowly lower the leg.  Perform this exercise against resistance later as your leg gets stronger.  Leg Slides: Lying on your back, slowly slide your foot toward your buttocks, bending your knee up off the floor (only go as far as is comfortable). Then slowly slide your foot back down until your leg is flat on the floor again.  Angel Wings: Lying on your back spread your legs to the side as far apart as you can without causing discomfort.  Hamstring Strength:  Lying on your back, push your heel against the floor with your leg straight by tightening up the muscles of your buttocks.  Repeat, but this time bend your knee to a comfortable angle, and push your heel against the floor.  You may put a pillow under the heel to make it more comfortable if necessary.   A rehabilitation program following joint replacement surgery can speed recovery and prevent re-injury in the future due to weakened  muscles. Contact your doctor or a physical therapist for more information on knee rehabilitation.    CONSTIPATION  Constipation is defined medically as fewer than three stools per week and severe constipation as less than one stool per week.  Even if you have a regular bowel pattern at home, your normal regimen is likely to be disrupted due to multiple reasons following surgery.  Combination of anesthesia, postoperative narcotics, change in appetite and fluid intake all can affect your bowels.   YOU MUST use at least one of the following options; they are listed in order of increasing strength to get the job done.  They are all available over the counter, and you may need to use some, POSSIBLY even all of these options:    Drink plenty of fluids (prune juice may be helpful) and high fiber foods Colace 100 mg by mouth twice a day  Senokot for constipation as  directed and as needed Dulcolax (bisacodyl), take with full glass of water  Miralax (polyethylene glycol) once or twice a day as needed.  If you have tried all these things and are unable to have a bowel movement in the first 3-4 days after surgery call either your surgeon or your primary doctor.    If you experience loose stools or diarrhea, hold the medications until you stool forms back up.  If your symptoms do not get better within 1 week or if they get worse, check with your doctor.  If you experience "the worst abdominal pain ever" or develop nausea or vomiting, please contact the office immediately for further recommendations for treatment.   ITCHING:  If you experience itching with your medications, try taking only a single pain pill, or even half a pain pill at a time.  You can also use Benadryl over the counter for itching or also to help with sleep.   TED HOSE STOCKINGS:  Use stockings on both legs until for at least 2 weeks or as directed by physician office. They may be removed at night for sleeping.  MEDICATIONS:  See your  medication summary on the "After Visit Summary" that nursing will review with you.  You may have some home medications which will be placed on hold until you complete the course of blood thinner medication.  It is important for you to complete the blood thinner medication as prescribed.  PRECAUTIONS:  If you experience chest pain or shortness of breath - call 911 immediately for transfer to the hospital emergency department.   If you develop a fever greater that 101 F, purulent drainage from wound, increased redness or drainage from wound, foul odor from the wound/dressing, or calf pain - CONTACT YOUR SURGEON.                                                   FOLLOW-UP APPOINTMENTS:  If you do not already have a post-op appointment, please call the office for an appointment to be seen by your surgeon.  Guidelines for how soon to be seen are listed in your "After Visit Summary", but are typically between 1-4 weeks after surgery.  OTHER INSTRUCTIONS:   Knee Replacement:  Do not place pillow under knee, focus on keeping the knee straight while resting. CPM instructions: 0-90 degrees, 2 hours in the morning, 2 hours in the afternoon, and 2 hours in the evening. Place foam block, curve side up under heel at all times except when in CPM or when walking.  DO NOT modify, tear, cut, or change the foam block in any way.  POST-OPERATIVE OPIOID TAPER INSTRUCTIONS: It is important to wean off of your opioid medication as soon as possible. If you do not need pain medication after your surgery it is ok to stop day one. Opioids include: Codeine, Hydrocodone(Norco, Vicodin), Oxycodone(Percocet, oxycontin) and hydromorphone amongst others.  Long term and even short term use of opiods can cause: Increased pain response Dependence Constipation Depression Respiratory depression And more.  Withdrawal symptoms can include Flu like symptoms Nausea, vomiting And more Techniques to manage these symptoms Hydrate  well Eat regular healthy meals Stay active Use relaxation techniques(deep breathing, meditating, yoga) Do Not substitute Alcohol to help with tapering If you have been on opioids for less than two weeks and do not have pain  than it is ok to stop all together.  Plan to wean off of opioids This plan should start within one week post op of your joint replacement. Maintain the same interval or time between taking each dose and first decrease the dose.  Cut the total daily intake of opioids by one tablet each day Next start to increase the time between doses. The last dose that should be eliminated is the evening dose.   MAKE SURE YOU:  Understand these instructions.  Get help right away if you are not doing well or get worse.    Thank you for letting us be a part of your medical care team.  It is a privilege we respect greatly.  We hope these instructions will help you stay on track for a fast and full recovery!    Dental Antibiotics:  In most cases prophylactic antibiotics for Dental procdeures after total joint surgery are not necessary.  Exceptions are as follows:  1. History of prior total joint infection  2. Severely immunocompromised (Organ Transplant, cancer chemotherapy, Rheumatoid biologic meds such as Humera)  3. Poorly controlled diabetes (A1C &gt; 8.0, blood glucose over 200)  If you have one of these conditions, contact your surgeon for an antibiotic prescription, prior to your dental procedure.   Increase activity slowly as tolerated   Complete by: As directed    Post-operative opioid taper instructions:   Complete by: As directed    POST-OPERATIVE OPIOID TAPER INSTRUCTIONS: It is important to wean off of your opioid medication as soon as possible. If you do not need pain medication after your surgery it is ok to stop day one. Opioids include: Codeine, Hydrocodone(Norco, Vicodin), Oxycodone(Percocet, oxycontin) and hydromorphone amongst others.  Long term and  even short term use of opiods can cause: Increased pain response Dependence Constipation Depression Respiratory depression And more.  Withdrawal symptoms can include Flu like symptoms Nausea, vomiting And more Techniques to manage these symptoms Hydrate well Eat regular healthy meals Stay active Use relaxation techniques(deep breathing, meditating, yoga) Do Not substitute Alcohol to help with tapering If you have been on opioids for less than two weeks and do not have pain than it is ok to stop all together.  Plan to wean off of opioids This plan should start within one week post op of your joint replacement. Maintain the same interval or time between taking each dose and first decrease the dose.  Cut the total daily intake of opioids by one tablet each day Next start to increase the time between doses. The last dose that should be eliminated is the evening dose.           Follow-up Information     Hilts, Casimiro Needle, MD Follow up.   Specialty: Family Medicine Contact information: [address] [place] [address] [phone]         [facility] Follow up.   Why: [address] [place] ,[place] [address] [phone]   Home health services will be provided by [facility] The Office will contact you 24-48 hours after discharge..                 SignedKarenann Cai 02/06/2024, 7:44 PM

## 2024-02-09 ENCOUNTER — Ambulatory Visit (INDEPENDENT_AMBULATORY_CARE_PROVIDER_SITE_OTHER)

## 2024-02-09 ENCOUNTER — Ambulatory Visit (INDEPENDENT_AMBULATORY_CARE_PROVIDER_SITE_OTHER): Admitting: Surgical

## 2024-02-09 ENCOUNTER — Encounter: Payer: Self-pay | Admitting: Surgical

## 2024-02-09 ENCOUNTER — Ambulatory Visit (INDEPENDENT_AMBULATORY_CARE_PROVIDER_SITE_OTHER)
Admission: RE | Admit: 2024-02-09 | Discharge: 2024-02-09 | Disposition: A | Discharge status: Home / Self Care | Source: Ambulatory Visit | Attending: Surgical

## 2024-02-09 ENCOUNTER — Ambulatory Visit (HOSPITAL_COMMUNITY)
Admission: RE | Admit: 2024-02-09 | Discharge: 2024-02-09 | Disposition: A | Discharge status: Home / Self Care | Source: Ambulatory Visit | Attending: Surgical | Admitting: Surgical

## 2024-02-09 VITALS — BP 124/80 | HR 78 | Temp 96.3°F

## 2024-02-09 DIAGNOSIS — R0602 Shortness of breath: Secondary | ICD-10-CM | POA: Diagnosis present

## 2024-02-09 DIAGNOSIS — Z96651 Presence of right artificial knee joint: Secondary | ICD-10-CM

## 2024-02-09 MED ORDER — IOHEXOL 350 MG/ML SOLN
75.0000 mL | Freq: Once | INTRAVENOUS | Status: AC | PRN
  Administered 2024-02-09: 75 mL via INTRAVENOUS

## 2024-02-09 NOTE — Progress Notes (Signed)
 Post-Op Visit Note   Patient: Daniel Hurley           Date of Birth: [birth year]           MRN: [id number] Visit Date: 02/09/2024 PCP: Lavada Mesi, MD   Assessment & Plan:  Chief Complaint: No chief complaint on file.  Visit Diagnoses:  1. S/P total knee arthroplasty, right   2. Shortness of breath     Plan: Daniel Hurley is a 68 y.o. male who presents s/p right total knee arthroplasty on 01/27/2024.  Doing well overall but the first week that he spent at home he had a fair amount of nausea and decreased appetite.  This has now improved but he reports he has had shortness of breath pretty much since the night he got home from the hospital.  Denies any chest pain or palpitations or any abdominal pain.  Using CPM machine up to about 70 degrees.  Ambulating with walker.  He does have history of COPD for which he takes Trelegy.  He has been using this every day as usual.  Has not had his albuterol inhaler refilled recently and has not used this.  He got Dr. Prince Rome to refill this and is planning to obtain this today.  On exam, patient has range of motion 10 degrees extension to 80 degrees of knee flexion.  Incision is healing well without evidence of infection or dehiscence.  2+ DP pulse of the operative extremity.  Moderate calf tenderness with asymmetric calf swelling and positive Homans' sign.  Able to perform straight leg raise.  Intact ankle dorsiflexion.  And plantarflexion.  Patient is visibly short of breath just sitting in the office about 15 to 20 minutes after ambulating in from the waiting room.  Plan is continue with physical therapy exercises with home health physical therapy and home exercise program.  He wants to avoid outpatient therapy if possible.  Emphasized his need to work on achieving full knee extension and demonstrated how to do this in the office today.  However, more importantly then the knee range of motion, he has calf tenderness and visible shortness of breath  which is concerning for DVT versus PE.  We will obtain urgent CTA of the chest to rule out PE and ultrasound to rule out DVT.  His vital signs today in the office were overall not overly concerning with oxygen saturation of 98%, heart rate of 78, blood pressure of 124/80..    Follow-Up Instructions: No follow-ups on file.   Orders:  No orders of the defined types were placed in this encounter.  No orders of the defined types were placed in this encounter.   Imaging: No results found.  PMFS History: Patient Active Problem List   Diagnosis Date Noted   Arthritis of right knee 01/30/2024   S/P total knee replacement, right 01/27/2024   Unstable angina (HCC)    Chronic neck pain 11/22/2020   Hypertension 02/12/2020   Acute respiratory failure with hypoxia (HCC) 01/20/2016   Chest pain 01/19/2016   COPD exacerbation (HCC) 01/19/2016   Tobacco abuse 01/19/2016   Past Medical History:  Diagnosis Date   COPD (chronic obstructive pulmonary disease) (HCC)    Coronary artery disease    Hypertension 02/12/2020    No family history on file.  Past Surgical History:  Procedure Laterality Date   ANKLE FUSION  2006   BACK SURGERY  2008   L5-5 fusion    CORONARY PRESSURE/FFR STUDY N/A 07/08/2022  Procedure: INTRAVASCULAR PRESSURE WIRE/FFR STUDY;  Surgeon: Kathleene Hazel, MD;  Location: [facility];  Service: Cardiovascular;  Laterality: N/A;   CORONARY STENT INTERVENTION N/A 07/08/2022   Procedure: CORONARY STENT INTERVENTION;  Surgeon: Kathleene Hazel, MD;  Location: [facility];  Service: Cardiovascular;  Laterality: N/A;   INGUINAL HERNIA REPAIR Right 12/27/2023   Procedure: OPEN RIGHT INGUINAL HERNIA REPAIR WITH MESH;  Surgeon: Emelia Loron, MD;  Location: [facility] OR;  Service: General;  Laterality: Right;   LEFT HEART CATH AND CORONARY ANGIOGRAPHY N/A 07/08/2022   Procedure: LEFT HEART CATH AND CORONARY ANGIOGRAPHY;  Surgeon: Kathleene Hazel, MD;   Location: [facility];  Service: Cardiovascular;  Laterality: N/A;   left wrist surgery     TOTAL KNEE ARTHROPLASTY Right 01/27/2024   Procedure: RIGHT TOTAL KNEE ARTHROPLASTY;  Surgeon: Cammy Copa, MD;  Location[facility] OR;  Service: Orthopedics;  Laterality: Right;   Social History   Occupational History   Not on file  Tobacco Use   Smoking status: Every Day    Current packs/day: 1.50    Types: Cigarettes   Smokeless tobacco: Never  Vaping Use   Vaping status: Never Used  Substance and Sexual Activity   Alcohol use: No   Drug use: No   Sexual activity: Not Currently

## 2024-02-10 NOTE — Progress Notes (Signed)
 I called Daniel Hurley and left a voicemail message detailing that he is negative for DVT/PE.  I assume the hospital told him this but I just wanted to make sure

## 2024-02-16 ENCOUNTER — Encounter: Payer: Medicare Other | Admitting: Surgical

## 2024-03-08 ENCOUNTER — Encounter: Payer: Self-pay | Admitting: Surgical

## 2024-03-08 ENCOUNTER — Ambulatory Visit (INDEPENDENT_AMBULATORY_CARE_PROVIDER_SITE_OTHER): Admitting: Surgical

## 2024-03-08 DIAGNOSIS — Z96651 Presence of right artificial knee joint: Secondary | ICD-10-CM

## 2024-03-08 MED ORDER — OXYCODONE HCL 5 MG PO TABS
5.0000 mg | ORAL_TABLET | Freq: Three times a day (TID) | ORAL | 0 refills | Status: DC | PRN
Start: 2024-03-08 — End: 2024-03-14

## 2024-03-08 MED ORDER — BACLOFEN 10 MG PO TABS: 10.0000 mg | ORAL_TABLET | Freq: Every day | ORAL | 0 refills | Status: DC | PRN

## 2024-03-08 NOTE — Progress Notes (Signed)
 Post-Op Visit Note   Patient: Daniel Hurley           Date of Birth: 25-May-1956           MRN: [id number] Visit Date: 03/08/2024 PCP: Rey Catholic, MD   Assessment & Plan:  Chief Complaint:  Chief Complaint  Patient presents with   Right Knee - Follow-up    Right TKA 01/27/24   Visit Diagnoses:  1. S/P total knee arthroplasty, right     Plan: Patient is a 68 year old male who presents s/p right total knee arthroplasty on 01/27/2024.  He has been working with home health physical therapy.  He had his last session yesterday.  He has gotten to about 84 degrees of knee flexion.  Still having difficulty bending it.  He is about 6 weeks out.  Taking only occasional ibuprofen.  Pain is overall doing okay and he is more more ambulatory.  Not using a cane or walker to get around.  On exam, patient has about 5 degrees extension and 80 degrees of knee flexion with decreased patellar mobility side-to-side.  Incision is well-healed.  No effusion.  He does have some generalized swelling about the knee.  No calf tenderness.  Negative Homans' sign.  He has excellent quad strength rated 5/5.  Able to perform straight leg raise without extensor lag.  There is a fairly hard endpoint to his knee flexion.  After discussion of his knee, think the best option for Daniel Hurley is to pursue knee manipulation under anesthesia at some point in the near future.  Also need to set him up for physical therapy in [place] for outpatient PT since he has finished home health PT.  We discussed the typical recovery timeframe after knee manipulation as well as what to expect.  He will need to be in with physical therapy and keep up with his regular home exercise program fairly diligently.  Refilled muscle relaxer and pain medicine.  He will call to schedule manipulation under anesthesia within the next 1 to 2 weeks.  Follow-Up Instructions: No follow-ups on file.   Orders:  Orders Placed This Encounter  Procedures    Ambulatory referral to Physical Therapy   Meds ordered this encounter  Medications   oxyCODONE  (OXY IR/ROXICODONE ) 5 MG immediate release tablet    Sig: Take 1 tablet (5 mg total) by mouth every 8 (eight) hours as needed for moderate pain (pain score 4-6) (pain score 4-6).    Dispense:  30 tablet    Refill:  0   baclofen (LIORESAL) 10 MG tablet    Sig: Take 1 tablet (10 mg total) by mouth daily as needed for muscle spasms.    Dispense:  30 each    Refill:  0    Imaging: No results found.  PMFS History: Patient Active Problem List   Diagnosis Date Noted   Arthritis of right knee 01/30/2024   S/P total knee replacement, right 01/27/2024   Unstable angina (HCC)    Chronic neck pain 11/22/2020   Hypertension 02/12/2020   Acute respiratory failure with hypoxia (HCC) 01/20/2016   Chest pain 01/19/2016   COPD exacerbation (HCC) 01/19/2016   Tobacco abuse 01/19/2016   Past Medical History:  Diagnosis Date   COPD (chronic obstructive pulmonary disease) (HCC)    Coronary artery disease    Hypertension 02/12/2020    No family history on file.  Past Surgical History:  Procedure Laterality Date   ANKLE FUSION  2006   BACK SURGERY  2008   L5-5 fusion    CORONARY PRESSURE/FFR STUDY N/A 07/08/2022   Procedure: INTRAVASCULAR PRESSURE WIRE/FFR STUDY;  Surgeon: Odie Benne, MD;  [facility];  Service: Cardiovascular;  Laterality: N/A;   CORONARY STENT INTERVENTION N/A 07/08/2022   Procedure: CORONARY STENT INTERVENTION;  Surgeon: Odie Benne, MD;  [facility]: [facility];  Service: Cardiovascular;  Laterality: N/A;   INGUINAL HERNIA REPAIR Right 12/27/2023   Procedure: OPEN RIGHT INGUINAL HERNIA REPAIR WITH MESH;  Surgeon: Enid Harry, MD;  Location: [facility] OR;  Service: General;  Laterality: Right;   LEFT HEART CATH AND CORONARY ANGIOGRAPHY N/A 07/08/2022   Procedure: LEFT HEART CATH AND CORONARY ANGIOGRAPHY;  Surgeon: Odie Benne, MD;  [facility];  Service: Cardiovascular;  Laterality: N/A;   left wrist surgery     TOTAL KNEE ARTHROPLASTY Right 01/27/2024   Procedure: RIGHT TOTAL KNEE ARTHROPLASTY;  Surgeon: Jasmine Mesi, MD;  Location: [facility] OR;  Service: Orthopedics;  Laterality: Right;   Social History   Occupational History   Not on file  Tobacco Use   Smoking status: Every Day    Current packs/day: 1.50    Types: Cigarettes   Smokeless tobacco: Never  Vaping Use   Vaping status: Never Used  Substance and Sexual Activity   Alcohol use: No   Drug use: No   Sexual activity: Not Currently

## 2024-03-10 ENCOUNTER — Telehealth: Payer: Self-pay | Admitting: Orthopedic Surgery

## 2024-03-10 NOTE — Telephone Encounter (Signed)
 Patient had right total knee 01-27-24 with Dr. Rozelle Corning and states he is to go back in for manipulation under anesthesia. Patient would like to go ahead and get scheduled.  Please provide surgery order.

## 2024-03-13 ENCOUNTER — Encounter (HOSPITAL_COMMUNITY): Payer: Self-pay | Admitting: Orthopedic Surgery

## 2024-03-13 ENCOUNTER — Telehealth: Payer: Self-pay | Admitting: Orthopedic Surgery

## 2024-03-13 ENCOUNTER — Other Ambulatory Visit: Payer: Self-pay

## 2024-03-13 NOTE — Telephone Encounter (Signed)
 Patient is scheduled for right knee closed manipulation 03-14-24 at [facility].  Pre-op asking if we have provided patient with ASA instructions.  Patient did say he took 1 dose this morning. Per [place] Gelinas patient is to hold the other 81mg  dose of aspirin  today and is to hold ASA tomorrow.

## 2024-03-13 NOTE — Anesthesia Preprocedure Evaluation (Addendum)
 Anesthesia Evaluation  Patient identified by MRN, date of birth, ID band Patient awake    Reviewed: Allergy & Precautions, NPO status , Patient's Chart, lab work & pertinent test results  History of Anesthesia Complications Negative for: history of anesthetic complications  Airway Mallampati: II  TM Distance: >3 FB Neck ROM: Full    Dental  (+) Dental Advisory Given, Missing, Chipped   Pulmonary COPD,  COPD inhaler, Current SmokerPatient did not abstain from smoking.   Pulmonary exam normal        Cardiovascular hypertension, Pt. on medications + CAD and + Cardiac Stents  Normal cardiovascular exam   '24 Myoperfusion - Findings are consistent with no ischemia. The study is low risk.   No ST deviation was noted.   LV perfusion is abnormal. Defect 1: There is a medium defect with mild reduction in uptake present in the apical to basal inferior location(s) that is fixed. There is normal wall motion in the defect area. Consistent with artifact.   Left ventricular function is abnormal. Global function is mildly reduced. Nuclear stress EF: 52 %. The left ventricular ejection fraction is mildly decreased (45-54%). End diastolic cavity size is normal. End systolic cavity size is normal.    Neuro/Psych negative neurological ROS  negative psych ROS   GI/Hepatic negative GI ROS, Neg liver ROS,,,  Endo/Other  negative endocrine ROS    Renal/GU negative Renal ROS     Musculoskeletal  (+) Arthritis ,    Abdominal   Peds  Hematology negative hematology ROS (+)   Anesthesia Other Findings   Reproductive/Obstetrics                             Anesthesia Physical Anesthesia Plan  ASA: 3  Anesthesia Plan: General   Post-op Pain Management: Tylenol  PO (pre-op)*   Induction: Intravenous  PONV Risk Score and Plan: 1 and Treatment may vary due to age or medical condition, Ondansetron  and  Dexamethasone   Airway Management Planned: LMA  Additional Equipment: None  Intra-op Plan:   Post-operative Plan: Extubation in OR  Informed Consent: I have reviewed the patients History and Physical, chart, labs and discussed the procedure including the risks, benefits and alternatives for the proposed anesthesia with the patient or authorized representative who has indicated his/her understanding and acceptance.     Dental advisory given  Plan Discussed with: CRNA and Anesthesiologist  Anesthesia Plan Comments: (PAT note written 03/13/2024 by Janes Colegrove, PA-C.  )       Anesthesia Quick Evaluation

## 2024-03-13 NOTE — Progress Notes (Signed)
 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 S/p right TKA on 01/27/24 and now with arthrofibrosis and above procedure recommended.    History includes smoking, COPD, hypertension, CAD (DES mid LAD 07/08/22 for unstable angina), spinal surgery (L4-5 PLIF 04/29/07), hernia (left IHR 03/21/01; right IHR 12/27/23), osteoarthritis (right TKA 01/27/24).   His last cardiology evaluation was by Dr. Stann Earnest on 01/27/24 when patient presented for right TKA. He had DES to mid LAD on 07/08/22. Last stress test for chest tightness was on 12/23/22 and showed inferior wall perfusion abnormality that improved slightly with stress and felt most consistent with artifact, no ischemia, EF 45-54%. He had tolerated right inguinal hernia repair on 12/27/23, but he had not resumed DAPT (until ASA resumed on 01/24/24),so anesthesiologist had requested updated guidance.  He was ultimately cleared to proceed with right TKA on 01/27/24, continue ASA and statin. DES > 18 months ago. (Patient reportedly stated that he is not longer on Plavix  because he  "has a puppy at home that was scratching him and he was bleeding so his cardiologist said he didn't need to go back on his plavix .")  Ortho did order a CTA chest and RLE venous Duplex on 02/09/24 due to some calf tenderness and SOB post-operatively. Imaging studies were negative for RLE DVT and PE. Last ASA 03/13/24.   Anesthesia team to evaluate on the day of surgery.   VS:  BP Readings from Last 3 Encounters:  02/09/24 124/80  01/27/24 112/79  01/19/24 131/76   Pulse Readings from Last 3 Encounters:  02/09/24  78  01/27/24 78  01/19/24 69    PROVIDERS: Rey Catholic, MD is PCP  Janelle Mediate, MD is cardiologist   LABS: For day of procedure as indicated.    IMAGES: CTA Chest 02/09/24: IMPRESSION: 1. No evidence of pulmonary embolus. 2. No acute intrathoracic process. 3. Aortic Atherosclerosis (ICD10-I70.0) and Emphysema (ICD10-J43.9). 4. Coronary artery atherosclerosis.  Xray Right knee 02/09/24: "AP and lateral views of the knee reviewed.  Total knee arthroplasty  prosthesis in good position and alignment without any complicating  features.  There is no evidence of dislocation, periprosthetic fracture,  patella baja/alta"    EKG: 12/22/23: NSR     CV: LE Venous US  02/09/24: Summary: RIGHT: - No evidence of deep vein thrombosis in the lower extremity. No indirect evidence of obstruction proximal to the inguinal ligament. - No cystic structure found in the popliteal fossa. - Increased skin thickness in right lateral upper calf where patient is complaining of pain. Mild superficial edema seen in this area also. LEFT: - No evidence of common femoral vein obstruction.   Nuclear stress test 12/23/22:   Findings are consistent with no ischemia. The study is low risk.   No ST deviation was noted.   LV perfusion is abnormal. Defect 1: There is a medium defect with mild reduction in uptake present in the apical to basal inferior location(s) that is fixed. There is normal wall motion in the defect area. Consistent with artifact.   Left ventricular function is abnormal. Global function is mildly reduced. Nuclear stress  EF: 52 %. The left ventricular ejection fraction is mildly decreased (45-54%). End diastolic cavity size is normal. End systolic cavity size is normal.   Prior study not available for comparison.   Inferior wall with decreased uptake at rest that improves slightly with stress, most consistent with artifact. No ischemia noted. Mildly reduced LVEF without focal wall motion  abnormalities.     Cardiac cath 07/08/22:   Prox RCA lesion is 20% stenosed.   Ost Cx to Prox Cx lesion is 40% stenosed.   Ramus lesion is 40% stenosed.   Prox LAD to Mid LAD lesion is 70% stenosed.   A drug-eluting stent was successfully placed using a SYNERGY XD [id number].   Post intervention, there is a 0% residual stenosis.   Severe mid LAD stenosis. RFR 0.84.  Mild non-obstructive disease in the ostial Circumflex and intermediate branch.  Mild non-obstructive disease in the mid RCA Successful PTCA/DES x 1 mid LAD   Recommendations: Continue DAPT with ASA and Plavix  for at least six months. Continue beta blocker and statin.    Past Medical History:  Diagnosis Date   COPD (chronic obstructive pulmonary disease) (HCC)    Coronary artery disease    Hypertension 02/12/2020    Past Surgical History:  Procedure Laterality Date   ANKLE FUSION  2006   BACK SURGERY  2008   L5-5 fusion    CORONARY PRESSURE/FFR STUDY N/A 07/08/2022   Procedure: INTRAVASCULAR PRESSURE WIRE/FFR STUDY;  Surgeon: Odie Benne, MD;  [facility];  Service: Cardiovascular;  Laterality: N/A;   CORONARY STENT INTERVENTION N/A 07/08/2022   Procedure: CORONARY STENT INTERVENTION;  Surgeon: Odie Benne, MD;  [facility];  Service: Cardiovascular;  Laterality: N/A;   INGUINAL HERNIA REPAIR Right 12/27/2023   Procedure: OPEN RIGHT INGUINAL HERNIA REPAIR WITH MESH;  Surgeon: Enid Harry, MD;  Location: [facility] OR;  Service: General;  Laterality: Right;   LEFT HEART CATH AND CORONARY ANGIOGRAPHY N/A 07/08/2022   Procedure: LEFT HEART CATH AND CORONARY ANGIOGRAPHY;  Surgeon: Odie Benne, MD;  [facility];  Service: Cardiovascular;  Laterality: N/A;   left wrist surgery     TOTAL KNEE ARTHROPLASTY Right 01/27/2024   Procedure: RIGHT TOTAL KNEE ARTHROPLASTY;  Surgeon: Jasmine Mesi, MD;  Location: [facility] OR;  Service: Orthopedics;   Laterality: Right;    MEDICATIONS: No current facility-administered medications for this encounter.    albuterol  (VENTOLIN  HFA) 108 (90 Base) MCG/ACT inhaler   amLODipine  (NORVASC ) 5 MG tablet   aspirin  EC 81 MG tablet   baclofen  (LIORESAL ) 10 MG tablet   Ferrous Sulfate Dried (FERROUS SULFATE CR PO)   Fluticasone -Umeclidin-Vilant 100-62.5-25 MCG/ACT AEPB   nitroGLYCERIN  (NITROSTAT ) 0.4 MG SL tablet   oxyCODONE  (OXY IR/ROXICODONE ) 5 MG immediate release tablet   rosuvastatin  (CRESTOR ) 40 MG tablet   sildenafil  (REVATIO ) 20 MG tablet   docusate sodium  (COLACE) 100 MG capsule    Ella Gun, PA-C Surgical Short Stay/Anesthesiology [facility] Phone [phone] [facility] Phone [phone] 03/13/2024 11:21 AM

## 2024-03-13 NOTE — Progress Notes (Signed)
 Spoke with the pt, he will arrive tom at 1215. Understands that he will be NPO post midnight but can have clear liquids up to 1145.

## 2024-03-13 NOTE — Progress Notes (Addendum)
 SDW CALL  Patient was given pre-op instructions over the phone. The opportunity was given for the patient to ask questions. No further questions asked. Patient verbalized understanding of instructions given.   PCP - Dr. Rey Catholic Cardiologist - Dr. Janelle Mediate  PPM/ICD - denies   Chest x-ray -  EKG - 12/22/23 Stress Test - 12/23/22 ECHO - 09/19/18 Cardiac Cath - 07/08/22  Sleep Study - denies  No DM  Last dose of GLP1 agonist-  n/a GLP1 instructions:  n/a  Blood Thinner Instructions: Aspirin  Instructions:  patient was not given Aspirin  instructions.  Last dose was 5/5 AM.  Reached out to Jannifer Mention at office for clarification.    ERAS Protcol - clears until 1115 PRE-SURGERY Ensure or G2-  n/a  COVID TEST- n/a   Anesthesia review: yes  Patient denies shortness of breath, fever, cough and chest pain over the phone call   All instructions explained to the patient, with a verbal understanding of the material. Patient agrees to go over the instructions while at home for a better understanding.    Patient states that he has not been on Plavix  since prior to his hernia surgery in February and that it was stopped by his cardiologist.    Update: 1433 - patient is not to take another dose of Aspirin  today or tomorrow morning.  Per Jannifer Mention, patient's wife was made aware.

## 2024-03-14 ENCOUNTER — Ambulatory Visit (HOSPITAL_COMMUNITY)
Admission: RE | Admit: 2024-03-14 | Discharge: 2024-03-14 | Disposition: A | Discharge status: Home / Self Care | Attending: Orthopedic Surgery | Admitting: Orthopedic Surgery

## 2024-03-14 ENCOUNTER — Encounter (HOSPITAL_COMMUNITY): Payer: Self-pay | Admitting: Orthopedic Surgery

## 2024-03-14 ENCOUNTER — Other Ambulatory Visit: Payer: Self-pay

## 2024-03-14 ENCOUNTER — Ambulatory Visit (HOSPITAL_BASED_OUTPATIENT_CLINIC_OR_DEPARTMENT_OTHER): Admitting: Vascular Surgery

## 2024-03-14 ENCOUNTER — Encounter (HOSPITAL_COMMUNITY)
Admission: RE | Disposition: A | Discharge status: Home / Self Care | Payer: Self-pay | Source: Home / Self Care | Attending: Orthopedic Surgery

## 2024-03-14 ENCOUNTER — Ambulatory Visit (HOSPITAL_COMMUNITY): Admitting: Vascular Surgery

## 2024-03-14 DIAGNOSIS — I10 Essential (primary) hypertension: Secondary | ICD-10-CM | POA: Insufficient documentation

## 2024-03-14 DIAGNOSIS — F17210 Nicotine dependence, cigarettes, uncomplicated: Secondary | ICD-10-CM

## 2024-03-14 DIAGNOSIS — I2510 Atherosclerotic heart disease of native coronary artery without angina pectoris: Secondary | ICD-10-CM

## 2024-03-14 DIAGNOSIS — J449 Chronic obstructive pulmonary disease, unspecified: Secondary | ICD-10-CM

## 2024-03-14 DIAGNOSIS — M24661 Ankylosis, right knee: Secondary | ICD-10-CM

## 2024-03-14 DIAGNOSIS — Z955 Presence of coronary angioplasty implant and graft: Secondary | ICD-10-CM | POA: Diagnosis not present

## 2024-03-14 DIAGNOSIS — Z9889 Other specified postprocedural states: Secondary | ICD-10-CM | POA: Diagnosis not present

## 2024-03-14 DIAGNOSIS — Z96651 Presence of right artificial knee joint: Secondary | ICD-10-CM | POA: Insufficient documentation

## 2024-03-14 HISTORY — PX: KNEE CLOSED REDUCTION: SHX995

## 2024-03-14 SURGERY — MANIPULATION, KNEE, CLOSED
Anesthesia: General | Site: Knee | Laterality: Right

## 2024-03-14 MED ORDER — CLONIDINE HCL (ANALGESIA) 100 MCG/ML EP SOLN
EPIDURAL | Status: AC
  Filled 2024-03-14: qty 10

## 2024-03-14 MED ORDER — PROPOFOL 10 MG/ML IV BOLUS
INTRAVENOUS | Status: AC
  Filled 2024-03-14: qty 20

## 2024-03-14 MED ORDER — FENTANYL CITRATE (PF) 100 MCG/2ML IJ SOLN: 25.0000 ug | INTRAMUSCULAR | Status: DC | PRN

## 2024-03-14 MED ORDER — MORPHINE SULFATE (PF) 4 MG/ML IV SOLN
INTRAVENOUS | Status: AC
Start: 2024-03-14 — End: ?
  Filled 2024-03-14: qty 2

## 2024-03-14 MED ORDER — OXYCODONE HCL 5 MG/5ML PO SOLN: 5.0000 mg | Freq: Once | ORAL | Status: DC | PRN

## 2024-03-14 MED ORDER — BUPIVACAINE HCL (PF) 0.25 % IJ SOLN
INTRAMUSCULAR | Status: AC
  Filled 2024-03-14: qty 30

## 2024-03-14 MED ORDER — DEXAMETHASONE SODIUM PHOSPHATE 10 MG/ML IJ SOLN
INTRAMUSCULAR | Status: DC | PRN
  Administered 2024-03-14: 5 mg via INTRAVENOUS

## 2024-03-14 MED ORDER — CHLORHEXIDINE GLUCONATE 0.12 % MT SOLN
OROMUCOSAL | Status: AC
  Administered 2024-03-14: 15 mL via OROMUCOSAL
  Filled 2024-03-14: qty 15

## 2024-03-14 MED ORDER — MORPHINE SULFATE 4 MG/ML IJ SOLN
INTRAMUSCULAR | Status: DC | PRN
  Administered 2024-03-14: 33 mL

## 2024-03-14 MED ORDER — LACTATED RINGERS IV SOLN: INTRAVENOUS | Status: DC

## 2024-03-14 MED ORDER — FENTANYL CITRATE (PF) 100 MCG/2ML IJ SOLN
INTRAMUSCULAR | Status: AC
Start: 2024-03-14 — End: 2024-03-14
  Filled 2024-03-14: qty 2

## 2024-03-14 MED ORDER — CHLORHEXIDINE GLUCONATE 0.12 % MT SOLN: 15.0000 mL | Freq: Once | OROMUCOSAL | Status: AC

## 2024-03-14 MED ORDER — PROPOFOL 10 MG/ML IV BOLUS
INTRAVENOUS | Status: DC | PRN
  Administered 2024-03-14: 150 mg via INTRAVENOUS
  Administered 2024-03-14: 50 mg via INTRAVENOUS

## 2024-03-14 MED ORDER — POVIDONE-IODINE 10 % EX SWAB
2.0000 | Freq: Once | CUTANEOUS | Status: AC
  Administered 2024-03-14: 2 via TOPICAL

## 2024-03-14 MED ORDER — ACETAMINOPHEN 500 MG PO TABS
1000.0000 mg | ORAL_TABLET | Freq: Once | ORAL | Status: AC
  Administered 2024-03-14: 1000 mg via ORAL
  Filled 2024-03-14: qty 2

## 2024-03-14 MED ORDER — ONDANSETRON HCL 4 MG/2ML IJ SOLN: 4.0000 mg | Freq: Once | INTRAMUSCULAR | Status: DC | PRN

## 2024-03-14 MED ORDER — ONDANSETRON HCL 4 MG/2ML IJ SOLN
INTRAMUSCULAR | Status: DC | PRN
  Administered 2024-03-14: 4 mg via INTRAVENOUS

## 2024-03-14 MED ORDER — LIDOCAINE 2% (20 MG/ML) 5 ML SYRINGE
INTRAMUSCULAR | Status: DC | PRN
  Administered 2024-03-14: 100 mg via INTRAVENOUS

## 2024-03-14 MED ORDER — MIDAZOLAM HCL 2 MG/2ML IJ SOLN
INTRAMUSCULAR | Status: DC | PRN
  Administered 2024-03-14: 2 mg via INTRAVENOUS

## 2024-03-14 MED ORDER — MIDAZOLAM HCL 2 MG/2ML IJ SOLN
INTRAMUSCULAR | Status: AC
  Filled 2024-03-14: qty 2

## 2024-03-14 MED ORDER — POVIDONE-IODINE 7.5 % EX SOLN
Freq: Once | CUTANEOUS | Status: DC
  Filled 2024-03-14: qty 118

## 2024-03-14 MED ORDER — OXYCODONE HCL 5 MG PO TABS: 5.0000 mg | ORAL_TABLET | ORAL | 0 refills | Status: DC | PRN

## 2024-03-14 MED ORDER — ORAL CARE MOUTH RINSE: 15.0000 mL | Freq: Once | OROMUCOSAL | Status: AC

## 2024-03-14 MED ORDER — OXYCODONE HCL 5 MG PO TABS: 5.0000 mg | ORAL_TABLET | Freq: Once | ORAL | Status: DC | PRN

## 2024-03-14 MED ORDER — LIDOCAINE 2% (20 MG/ML) 5 ML SYRINGE
INTRAMUSCULAR | Status: AC
  Filled 2024-03-14: qty 5

## 2024-03-14 MED ORDER — FENTANYL CITRATE (PF) 100 MCG/2ML IJ SOLN
INTRAMUSCULAR | Status: DC | PRN
  Administered 2024-03-14 (×2): 50 ug via INTRAVENOUS

## 2024-03-14 SURGICAL SUPPLY — 3 items
BNDG ELASTIC 6INX 5YD STR LF (GAUZE/BANDAGES/DRESSINGS) IMPLANT
GAUZE SPONGE 4X4 12PLY STRL (GAUZE/BANDAGES/DRESSINGS) IMPLANT
SOL PREP POV-IOD 4OZ 10% (MISCELLANEOUS) IMPLANT

## 2024-03-14 NOTE — H&P (Signed)
 Daniel Hurley is an 68 y.o. male.   Chief Complaint: Right knee stiffness [name] is a 68 year old male who presents s/p right total knee arthroplasty on 01/27/2024. He has been working with home health physical therapy. He had his last session yesterday. He has gotten to about 84 degrees of knee flexion. Still having difficulty bending it. He is about 6 weeks out. Taking only occasional ibuprofen. Pain is overall doing okay and he is more more ambulatory. Not using a cane or walker to get around.    Past Medical History:  Diagnosis Date   COPD (chronic obstructive pulmonary disease) (HCC)    Coronary artery disease    Hypertension 02/12/2020    Past Surgical History:  Procedure Laterality Date   ANKLE FUSION  2006   BACK SURGERY  2008   L5-5 fusion    CORONARY PRESSURE/FFR STUDY N/A 07/08/2022   Procedure: INTRAVASCULAR PRESSURE WIRE/FFR STUDY;  Surgeon: Odie Benne, MD;  [facility];  Service: Cardiovascular;  Laterality: N/A;   CORONARY STENT INTERVENTION N/A 07/08/2022   Procedure: CORONARY STENT INTERVENTION;  Surgeon: Odie Benne, MD;  [facility];  Service: Cardiovascular;  Laterality: N/A;   INGUINAL HERNIA REPAIR Right 12/27/2023   Procedure: OPEN RIGHT INGUINAL HERNIA REPAIR WITH MESH;  Surgeon: Enid Harry, MD;  Location: [facility] OR;  Service: General;  Laterality: Right;   LEFT HEART CATH AND CORONARY ANGIOGRAPHY N/A 07/08/2022   Procedure: LEFT HEART CATH AND CORONARY ANGIOGRAPHY;  Surgeon: Odie Benne, MD;  [facility];  Service: Cardiovascular;  Laterality: N/A;   left wrist surgery     TOTAL KNEE ARTHROPLASTY Right 01/27/2024   Procedure: RIGHT TOTAL KNEE ARTHROPLASTY;  Surgeon: Jasmine Mesi, MD;  Location: [facility] OR;  Service: Orthopedics;  Laterality: Right;    History reviewed. No pertinent family history. Social History:  reports that he has been smoking cigarettes. He has  never used smokeless tobacco. He reports that he does not currently use alcohol. He reports that he does not use drugs.  Allergies: No Known Allergies  No medications prior to admission.    No results found for this or any previous visit (from the past 48 hours). No results found.  Review of Systems  Musculoskeletal:  Positive for arthralgias.  All other systems reviewed and are negative.   There were no vitals taken for this visit. Physical Exam Vitals reviewed.  HENT:     Head: Normocephalic.     Mouth/Throat:     Mouth: Mucous membranes are moist.  Eyes:     Pupils: Pupils are equal, round, and reactive to light.  Cardiovascular:     Rate and Rhythm: Normal rate.     Pulses: Normal pulses.  Pulmonary:     Effort: Pulmonary effort is normal.  Abdominal:     General: Abdomen is flat.  Musculoskeletal:     Cervical back: Normal range of motion.  Skin:    General: Skin is warm.     Capillary Refill: Capillary refill takes less than 2 seconds.  Neurological:     General: No focal deficit present.     Mental Status: He is alert.  Psychiatric:        Mood and Affect: Mood normal.    On exam, patient has about 5 degrees extension and 80 degrees of knee flexion with decreased patellar mobility side-to-side.  Incision is well-healed.  No effusion.  He does have some generalized swelling about  the knee.  No calf tenderness.  Negative Homans' sign.  He has excellent quad strength rated 5/5.  Able to perform straight leg raise without extensor lag.  There is a fairly hard endpoint to his knee flexion.    Assessment/Plan Impression is right knee arthrofibrosis now 6 weeks out right total knee replacement.  Knee manipulation under anesthesia indicated to increase flexion to beyond 90 degrees.  CPM machine use would be ideal after this manipulation.  Importance of working on knee range of motion for 5 to 6 hours/day after surgery discussed.  Implants should have adequate bony  ingrowth at this time for manipulation.  Also need to set him up for physical therapy in [place] for outpatient PT since he has finished home health PT. We discussed the typical recovery timeframe after knee manipulation as well as what to expect.  Risk and benefits of the surgery are discussed with the patient including not limited to incomplete pain relief and incomplete restoration of motion as well as potential for fracture.  All questions answered.  Daniel Snipes, MD 03/14/2024, 11:16 AM

## 2024-03-14 NOTE — Progress Notes (Signed)
 No pre-op labs needed per Dr. Glenetta Lane.   Pia Brew, RN

## 2024-03-14 NOTE — Anesthesia Postprocedure Evaluation (Signed)
 Anesthesia Post Note  Patient: Daniel Hurley  Procedure(s) Performed: CLOSED RIGHT KNEE MANIPULATION (Right: Knee)     Patient location during evaluation: PACU Anesthesia Type: General Level of consciousness: awake and alert Pain management: pain level controlled Vital Signs Assessment: post-procedure vital signs reviewed and stable Respiratory status: spontaneous breathing, nonlabored ventilation and respiratory function stable Cardiovascular status: stable and blood pressure returned to baseline Anesthetic complications: no  No notable events documented.  Last Vitals:  Vitals:   03/14/24 1615 03/14/24 1630  BP: 138/87 (!) 138/93  Pulse: 66 63  Resp: 18 16  Temp:  36.7 C  SpO2: 97% 98%    Last Pain:  Vitals:   03/14/24 1630  PainSc: 0-No pain                 Juventino Oppenheim

## 2024-03-14 NOTE — Brief Op Note (Signed)
   03/14/2024  3:55 PM  PATIENT:  Daniel Hurley  68 y.o. male  PRE-OPERATIVE DIAGNOSIS:  right knee arthrofibrosis  POST-OPERATIVE DIAGNOSIS:  right knee arthrofibrosis  PROCEDURE:  Procedure(s): CLOSED RIGHT KNEE MANIPULATION  SURGEON:  Surgeon(s): Rozelle Corning, Maricela Shoe, MD  ASSISTANT: magnant pa  ANESTHESIA:   general  EBL: 0 ml    Total I/O In: 1000 [I.V.:1000] Out: -   BLOOD ADMINISTERED: none  DRAINS: none   LOCAL MEDICATIONS USED:  marcaine  morphine  clonidine   SPECIMEN:  No Specimen  COUNTS:  YES  TOURNIQUET:  * No tourniquets in log *  DICTATION: .Other Dictation: Dictation Number [id number]  PLAN OF CARE: Discharge to home after PACU  PATIENT DISPOSITION:  PACU - hemodynamically stable

## 2024-03-14 NOTE — Anesthesia Procedure Notes (Signed)
 Procedure Name: LMA Insertion Date/Time: 03/14/2024 3:29 PM  Performed by: Luwanna Sam, CRNAPre-anesthesia Checklist: Patient identified, Emergency Drugs available, Suction available, Timeout performed and Patient being monitored Patient Re-evaluated:Patient Re-evaluated prior to induction Oxygen Delivery Method: Circle system utilized Preoxygenation: Pre-oxygenation with 100% oxygen Induction Type: IV induction LMA: LMA inserted LMA Size: 4.0 Number of attempts: 1 Placement Confirmation: positive ETCO2 and breath sounds checked- equal and bilateral Tube secured with: Tape

## 2024-03-14 NOTE — Transfer of Care (Signed)
 Immediate Anesthesia Transfer of Care Note  Patient: PARX SCHMEICHEL  Procedure(s) Performed: CLOSED RIGHT KNEE MANIPULATION (Right: Knee)  Patient Location: PACU  Anesthesia Type:General  Level of Consciousness: drowsy and patient cooperative  Airway & Oxygen Therapy: Patient Spontanous Breathing and Patient connected to face mask oxygen  Post-op Assessment: Report given to RN and Post -op Vital signs reviewed and stable  Post vital signs: Reviewed and stable  Last Vitals:  Vitals Value Taken Time  BP 139/89 03/14/24 1555  Temp    Pulse 59 03/14/24 1557  Resp 18 03/14/24 1557  SpO2 100 % 03/14/24 1557  Vitals shown include unfiled device data.  Last Pain:  Vitals:   03/14/24 1408  PainSc: 0-No pain         Complications: No notable events documented.

## 2024-03-15 ENCOUNTER — Encounter (HOSPITAL_COMMUNITY): Payer: Self-pay | Admitting: Orthopedic Surgery

## 2024-03-15 ENCOUNTER — Ambulatory Visit (INDEPENDENT_AMBULATORY_CARE_PROVIDER_SITE_OTHER): Admitting: Physical Therapy

## 2024-03-15 DIAGNOSIS — R600 Localized edema: Secondary | ICD-10-CM

## 2024-03-15 DIAGNOSIS — G8929 Other chronic pain: Secondary | ICD-10-CM

## 2024-03-15 DIAGNOSIS — M25561 Pain in right knee: Secondary | ICD-10-CM

## 2024-03-15 DIAGNOSIS — R2689 Other abnormalities of gait and mobility: Secondary | ICD-10-CM | POA: Diagnosis not present

## 2024-03-15 DIAGNOSIS — M25661 Stiffness of right knee, not elsewhere classified: Secondary | ICD-10-CM | POA: Diagnosis not present

## 2024-03-15 DIAGNOSIS — M6281 Muscle weakness (generalized): Secondary | ICD-10-CM | POA: Diagnosis not present

## 2024-03-15 NOTE — Op Note (Signed)
 NAME: Daniel Hurley, Daniel Hurley MEDICAL RECORD NO: [id number] ACCOUNT NO: [id number] DATE OF BIRTH: Aug 01, 1956 FACILITY: [facility] LOCATION: [facility] PHYSICIAN: Gloriann Larger. Rozelle Corning, MD  Operative Report   DATE OF PROCEDURE: 03/14/2024  PREOPERATIVE DIAGNOSIS:  Right knee arthrofibrosis.  POSTOPERATIVE DIAGNOSIS:  Right knee arthrofibrosis.  PROCEDURE:  Right knee manipulation under anesthesia.  SURGEON:  Gloriann Larger. Rozelle Corning, MD.  ASSISTANTVan Gelinas Magnet, PA  INDICATIONS:  The patient is a 68 year old with right knee arthrofibrosis following knee replacement who presents for operative management after explanations of the risks and benefits.  DESCRIPTION OF PROCEDURE:  The patient was brought to the operating room where general anesthetic was induced.  Timeout was called.  Right leg had initially range of motion of about 80 degrees.  Under gentle manipulation with minimizing the fulcrum, we  were able to flex the knee and break up scar tissue to get to approximately 120.  Patellar mobilization was performed.  Following manipulation under anesthesia, the knee was prepped with alcohol, Betadine , and then DuraPrep solution and a solution of  Marcaine , morphine , and clonidine  was injected into the knee for postop pain relief.  Band-Aid and Ace wrap applied.  The patient will start with as much knee range of motion as he can tolerate over the next week.  The patient tolerated the procedure  well without immediate complications.    MUK D: 03/14/2024 3:58:34 pm T: 03/15/2024 3:03:00 am  JOB: [id number]/ [id number]

## 2024-03-15 NOTE — Therapy (Signed)
 OUTPATIENT PHYSICAL THERAPY LOWER EXTREMITY EVALUATION   Patient Name: Daniel Hurley MRN: [id number] DOB:17-Jan-1956, 68 y.o., male Today's Date: 03/15/2024  END OF SESSION:  PT End of Session - 03/15/24 1607     Visit Number 1    Number of Visits 18    Date for PT Re-Evaluation 04/28/24    Authorization Type [employer] Medicare    Authorization Time Period 10% co-insurance    Progress Note Due on Visit 10    PT Start Time 1515    PT Stop Time 1601    PT Time Calculation (min) 46 min    Activity Tolerance Patient tolerated treatment well    Behavior During Therapy WFL for tasks assessed/performed             Past Medical History:  Diagnosis Date   COPD (chronic obstructive pulmonary disease) (HCC)    Coronary artery disease    Hypertension 02/12/2020   Past Surgical History:  Procedure Laterality Date   ANKLE FUSION  2006   BACK SURGERY  2008   L5-5 fusion    CORONARY PRESSURE/FFR STUDY N/A 07/08/2022   Procedure: INTRAVASCULAR PRESSURE WIRE/FFR STUDY;  Surgeon: Odie Benne, MD;  Location: [facility];  Service: Cardiovascular;  Laterality: N/A;   CORONARY STENT INTERVENTION N/A 07/08/2022   Procedure: CORONARY STENT INTERVENTION;  Surgeon: Odie Benne, MD;  Location: [facility];  Service: Cardiovascular;  Laterality: N/A;   INGUINAL HERNIA REPAIR Right 12/27/2023   Procedure: OPEN RIGHT INGUINAL HERNIA REPAIR WITH MESH;  Surgeon: Enid Harry, MD;  Location: [facility];  Service: General;  Laterality: Right;   KNEE CLOSED REDUCTION Right 03/14/2024   Procedure: CLOSED RIGHT KNEE MANIPULATION;  Surgeon: Jasmine Mesi, MD;  Location: [facility];  Service: Orthopedics;  Laterality: Right;   LEFT HEART CATH AND CORONARY ANGIOGRAPHY N/A 07/08/2022   Procedure: LEFT HEART CATH AND CORONARY ANGIOGRAPHY;  Surgeon: Odie Benne, MD;  Location: [facility];  Service: Cardiovascular;  Laterality: N/A;   left wrist surgery      TOTAL KNEE ARTHROPLASTY Right 01/27/2024   Procedure: RIGHT TOTAL KNEE ARTHROPLASTY;  Surgeon: Jasmine Mesi, MD;  Location: [facility] OR;  Service: Orthopedics;  Laterality: Right;   Patient Active Problem List   Diagnosis Date Noted   Arthritis of right knee 01/30/2024   S/P total knee replacement, right 01/27/2024   Unstable angina (HCC)    Chronic neck pain 11/22/2020   Hypertension 02/12/2020   Acute respiratory failure with hypoxia (HCC) 01/20/2016   Chest pain 01/19/2016   COPD exacerbation (HCC) 01/19/2016   Tobacco abuse 01/19/2016    PCP: Rey Catholic, MD  REFERRING PROVIDER: Casilda Clayman, PA-C   REFERRING DIAG: [id number] (ICD-10-CM) - S/P total knee arthroplasty, right   THERAPY DIAG:  Stiffness of right knee, not elsewhere classified  Chronic pain of right knee  Muscle weakness (generalized)  Other abnormalities of gait and mobility  Localized edema  Rationale for Evaluation and Treatment: Rehabilitation  ONSET DATE: 01/26/24 Right TKA & 03/14/2024 manipulation  SUBJECTIVE:   SUBJECTIVE STATEMENT: This 68yo male underwent right Total knee Arthroplasty on 01/26/2024 and had HHPT per patient request for 6 weeks.  He had difficulty achieving necessary range of motion so underwent Manipulation under anesthesia 03/14/2024 & referred to OPPT.     PERTINENT HISTORY: 01/26/24 Right TKA & 03/14/2024 manipulation, COPD, CAD w/stent, HTN, ankle fusion, back sg L5 fusion,   PAIN:  NPRS scale: at rest  0/10 and bending knee 3/10 max 10/10 Pain location: right knee anteriorly Pain description: ache & throbbing Aggravating factors:  bending knee Relieving factors: meds, ice, elevation  PRECAUTIONS: None  WEIGHT BEARING RESTRICTIONS: No  FALLS:  Has patient fallen in last 6 months? No  LIVING ENVIRONMENT: Lives with: lives with their spouse and adult grandson Lives in: Mobile home Stairs: Yes: External: 5 steps; can reach both Has following equipment at home:  Single point cane, Environmental consultant - 2 wheeled, Crutches, Wheelchair (manual), [employer], bed side commode, Grab bars, and Ramped entry  OCCUPATION: retired Music therapist  PLOF: Independent  PATIENT GOALS:    walk & bend knee  Next MD visit:  OBJECTIVE:  DIAGNOSTIC FINDINGS: 02/09/2024 X-ray AP and lateral views of the knee reviewed.  Total knee arthroplasty  prosthesis in good position and alignment without any complicating features.  There is no evidence of dislocation, periprosthetic fracture, patella   Patient-Specific Activity Scoring Scheme  "0" represents "unable to perform." "10" represents "able to perform at prior level. 0 1 2 3 4 5 6 7 8 9  10 (Date and Score)   Activity Eval     1. Walking   5    2. Stairs   4    3. Standing ADLs 6   4.    5.    Score 5    Total score = sum of the activity scores/number of activities Minimum detectable change (90%CI) for average score = 2 points Minimum detectable change (90%CI) for single activity score = 3 points  COGNITION: Overall cognitive status: WFL    SENSATION: WFL  EDEMA:  Circumferential:   LLE: above knee 38 cm,  around knee 37.3 cm, below knee 34.5 cm RLE: above knee 42 cm,  around knee 42.8 cm, below knee 36.8 cm  POSTURE: rounded shoulders, forward head, and weight shift left  PALPATION: Right knee: Tenderness along incision especially distally over tibia with some adherence, patella and anterior joint line. Pt denies muscle pain in quad, hamstring or gastroc.   LOWER EXTREMITY ROM:   ROM Right eval Left eval  Hip flexion    Hip extension    Hip abduction    Hip adduction    Hip internal rotation    Hip external rotation    Knee flexion Seated  P: 82* A: 85* Supine A: 71* P: 79*   Knee extension LAQ -22* Standing -18*   Ankle dorsiflexion    Ankle plantarflexion    Ankle inversion    Ankle eversion     (Blank rows = not tested)  LOWER EXTREMITY MMT:  MMT Right eval Left eval  Hip flexion     Hip extension    Hip abduction    Hip adduction    Hip internal rotation    Hip external rotation    Knee flexion    Knee extension HH dynameter 32.1# 30.2# RLE:LLE 59% HH dynameter 52.9# 53.3#  Ankle dorsiflexion    Ankle plantarflexion    Ankle inversion    Ankle eversion     (Blank rows = not tested)  FUNCTIONAL TESTS:  18 inch chair transfer: requires UE assist and decreased RLE assist moving from extended position sliding back upon arising.   GAIT: Distance walked: 150' Assistive device utilized: Single point cane Level of assistance: SBA / cues for deviations / no balance issues noted Comments: Antalgic pattern with decreased R leg stance duration, right knee flexed in stance with limited increased flexion for swing, RLE abducted  TODAY'S TREATMENT                                                                          DATE: 03/15/2024:  Therex:    HEP instruction/performance c cues for techniques, handout provided.  Trial set performed of each for comprehension and symptom assessment.  See below for exercise list  Self-care: PT recommended elevation with leg higher than the heart >/= 2 times per day for >/= 15 minutes.  Patient verbalized understanding  Manual therapy: Passive range of motion with overpressure for knee flexion seated.  PT constantly assessing end range.  PT able to feel quadriceps decreased resistance to stretch with prolonged hold.  PATIENT EDUCATION:  Education details: HEP, POC, elevation Person educated: Patient Education method: Explanation, Demonstration, Verbal cues, and Handouts Education comprehension: verbalized understanding, returned demonstration, and verbal cues required  HOME EXERCISE PROGRAM: Access Code: [email or web address] URL: [email or web address] Date: 03/15/2024 Prepared by: Lorie Rook  Exercises - Supine Heel Slide with Strap  - 2-4 x daily - 7 x weekly - 1-2 sets - 10 reps - 5 seconds hold - supine quad set with  towel roll under ankle  - 2-4 x daily - 7 x weekly - 1-2 sets - 10 reps - 5 seconds hold - supine knee flexion stretch with thigh vertical  - 2-4 x daily - 7 x weekly - 1-2 sets - 10 reps - 5 seconds hold - Seated Knee Flexion Extension AROM   - 2-4 x daily - 7 x weekly - 1-2 sets - 10 reps - 5 seconds hold  ASSESSMENT: CLINICAL IMPRESSION: Patient is a 68 y.o. who comes to clinic with complaints of right knee pain s/p TKA & MUA with mobility, strength and movement coordination deficits that impair their ability to perform usual daily and recreational functional activities without increase difficulty/symptoms at this time.  Patient to benefit from skilled PT services to address impairments and limitations to improve to previous level of function without restriction secondary to condition.   OBJECTIVE IMPAIRMENTS: Abnormal gait, decreased activity tolerance, decreased balance, decreased knowledge of condition, decreased knowledge of use of DME, decreased mobility, difficulty walking, decreased ROM, decreased strength, increased edema, impaired flexibility, and pain.   ACTIVITY LIMITATIONS: carrying, lifting, sitting, standing, squatting, sleeping, stairs, transfers, and locomotion level  PARTICIPATION LIMITATIONS: laundry, driving, and community activity  PERSONAL FACTORS: Fitness, Time since onset of injury/illness/exacerbation, and 3+ comorbidities: see PMH  are also affecting patient's functional outcome.   REHAB POTENTIAL: Good  CLINICAL DECISION MAKING: Stable/uncomplicated  EVALUATION COMPLEXITY: Low   GOALS: Goals reviewed with patient? Yes  SHORT TERM GOALS: (target date for Short term goals 04/07/2024)   1.  Patient will demonstrate independent use of home exercise program to maintain progress from in clinic treatments. Baseline: See objective data Goal status: Initial  2. PROM right Knee -5* ext to 90* flexion Baseline: See objective data Goal status: Initial  LONG TERM  GOALS: (target dates for all long term goals  04/28/2024 )   1. Patient will demonstrate/report pain at worst less than or equal to 2/10 to facilitate minimal limitation in daily activity secondary to pain symptoms. Baseline: See objective data Goal status: Initial   2. Patient will demonstrate  independent use of home exercise program to facilitate ability to maintain/progress functional gains from skilled physical therapy services. Baseline: See objective data Goal status: Initial  3.  Patient reports Patient-Specific Activity Score improved the average >/= 8 to indicate improvement in functional activities.  Baseline: SEE OBJECTIVE DATA Goal status: INITIAL   4.  Patient will demonstrate right knee LE MMT 5/5 (within 75% of LLE) throughout to faciltiate usual transfers, stairs, squatting at [facility] for daily life.  Baseline: See objective data Goal status: Initial   5.  PROM right knee -3* ext to 100* flexion Baseline: See objective data Goal status: Initial   6.  AROM right knee standing ext -5* to supine/seated flexion 90* Baseline: See objective data Goal status: Initial   PLAN:  PT FREQUENCY:  3-5x/week for first 2 weeks post Manipulation, then 2x/wk for 4 weeks  PT DURATION: 7 weeks  PLANNED INTERVENTIONS: 97164- PT Re-evaluation, 97750- Physical Performance Testing, 97110-Therapeutic exercises, 97530- Therapeutic activity, [id number]- Neuromuscular re-education, 97535- Self Care, 09811- Manual therapy, [id number]- Gait training, [phone]- Electrical stimulation (unattended), [phone]- Electrical stimulation (manual), [id number]- Fluidotherapy, Patient/Family education, Balance training, Stair training, Taping, Dry Needling, Joint mobilization, Scar mobilization, and DME instructions  PLAN FOR NEXT SESSION: Review & Update HEP. Manual therapy & therapeutic exercise for range.  Vaso for edema.   Lorie Rook, PT, DPT 03/15/2024, 4:33 PM  Date of referral: 03/08/2024 Referring provider: Casilda Clayman, PA-C  Referring diagnosis? Z30.865 (ICD-10-CM) - S/P total knee arthroplasty, right  Treatment diagnosis? (if different than referring diagnosis)  Stiffness of right knee, not elsewhere classified M25.661  Chronic pain of right knee M25.561, G89.29  Muscle weakness (generalized) M62.81  Other abnormalities of gait and mobility R26.89  Localized edema R60.0  What was this (referring dx) caused by? Surgery (Type: TKA)  Lonne Roan of Condition: Initial Onset (within last 3 months)   Laterality: Rt  Current Functional Measure Score: Patient Specific Functional Scale 5/10  Objective measurements identify impairments when they are compared to normal values, the uninvolved extremity, and prior level of function.  [x]  Yes  []  No  Objective assessment of functional ability: Moderate functional limitations   Briefly describe symptoms: right knee range limiting ADLs & gait, pain in right knee  How did symptoms start: surgery  Average pain intensity:  Last 24 hours: 3-10/10  Past week: 3-10/10  How often does the pt experience symptoms? Constantly  How much have the symptoms interfered with usual daily activities? Quite a bit  How has condition changed since care began at this facility? NA - initial visit  In general, how is the patients overall health? Very Good   BACK PAIN (STarT Back Screening Tool) No

## 2024-03-16 ENCOUNTER — Ambulatory Visit (INDEPENDENT_AMBULATORY_CARE_PROVIDER_SITE_OTHER): Admitting: Physical Therapy

## 2024-03-16 DIAGNOSIS — M25661 Stiffness of right knee, not elsewhere classified: Secondary | ICD-10-CM

## 2024-03-16 DIAGNOSIS — R600 Localized edema: Secondary | ICD-10-CM

## 2024-03-16 DIAGNOSIS — M6281 Muscle weakness (generalized): Secondary | ICD-10-CM

## 2024-03-16 DIAGNOSIS — M25561 Pain in right knee: Secondary | ICD-10-CM

## 2024-03-16 DIAGNOSIS — R2689 Other abnormalities of gait and mobility: Secondary | ICD-10-CM

## 2024-03-16 DIAGNOSIS — G8929 Other chronic pain: Secondary | ICD-10-CM

## 2024-03-16 NOTE — Therapy (Signed)
 OUTPATIENT PHYSICAL THERAPY TREATMENT   Patient Name: Daniel Hurley MRN: [id number] DOB:May 04, 1956, 68 y.o., male Today's Date: 03/16/2024  END OF SESSION:  PT End of Session - 03/16/24 1152     Visit Number 2    Number of Visits 18    Date for PT Re-Evaluation 04/28/24    Authorization Type [employer] Medicare    Authorization Time Period 10% co-insurance    Progress Note Due on Visit 10    PT Start Time 1152    PT Stop Time 1230    PT Time Calculation (min) 38 min    Activity Tolerance Patient tolerated treatment well    Behavior During Therapy WFL for tasks assessed/performed              Past Medical History:  Diagnosis Date   COPD (chronic obstructive pulmonary disease) (HCC)    Coronary artery disease    Hypertension 02/12/2020   Past Surgical History:  Procedure Laterality Date   ANKLE FUSION  2006   BACK SURGERY  2008   L5-5 fusion    CORONARY PRESSURE/FFR STUDY N/A 07/08/2022   Procedure: INTRAVASCULAR PRESSURE WIRE/FFR STUDY;  Surgeon: Odie Benne, MD;  Location: [facility];  Service: Cardiovascular;  Laterality: N/A;   CORONARY STENT INTERVENTION N/A 07/08/2022   Procedure: CORONARY STENT INTERVENTION;  Surgeon: Odie Benne, MD;  Location: [facility];  Service: Cardiovascular;  Laterality: N/A;   INGUINAL HERNIA REPAIR Right 12/27/2023   Procedure: OPEN RIGHT INGUINAL HERNIA REPAIR WITH MESH;  Surgeon: Enid Harry, MD;  Location: [facility] OR;  Service: General;  Laterality: Right;   KNEE CLOSED REDUCTION Right 03/14/2024   Procedure: CLOSED RIGHT KNEE MANIPULATION;  Surgeon: Jasmine Mesi, MD;  Location: [facility] OR;  Service: Orthopedics;  Laterality: Right;   LEFT HEART CATH AND CORONARY ANGIOGRAPHY N/A 07/08/2022   Procedure: LEFT HEART CATH AND CORONARY ANGIOGRAPHY;  Surgeon: Odie Benne, MD;  Location: [facility];  Service: Cardiovascular;  Laterality: N/A;   left wrist surgery     TOTAL KNEE  ARTHROPLASTY Right 01/27/2024   Procedure: RIGHT TOTAL KNEE ARTHROPLASTY;  Surgeon: Jasmine Mesi, MD;  Location: [facility] OR;  Service: Orthopedics;  Laterality: Right;   Patient Active Problem List   Diagnosis Date Noted   Arthritis of right knee 01/30/2024   S/P total knee replacement, right 01/27/2024   Unstable angina (HCC)    Chronic neck pain 11/22/2020   Hypertension 02/12/2020   Acute respiratory failure with hypoxia (HCC) 01/20/2016   Chest pain 01/19/2016   COPD exacerbation (HCC) 01/19/2016   Tobacco abuse 01/19/2016    PCP: Rey Catholic, MD  REFERRING PROVIDER: Casilda Clayman, PA-C   REFERRING DIAG: [phone] (ICD-10-CM) - S/P total knee arthroplasty, right   THERAPY DIAG:  Stiffness of right knee, not elsewhere classified  Chronic pain of right knee  Muscle weakness (generalized)  Other abnormalities of gait and mobility  Localized edema  Rationale for Evaluation and Treatment: Rehabilitation  ONSET DATE: 01/26/24 Right TKA & 03/14/2024 manipulation  SUBJECTIVE:   SUBJECTIVE STATEMENT: Pt states he did some exercises last night -- feels sore today.   From eval: This 68yo male underwent right Total knee Arthroplasty on 01/26/2024 and had HHPT per patient request for 6 weeks.  He had difficulty achieving necessary range of motion so underwent Manipulation under anesthesia 03/14/2024 & referred to OPPT.     PERTINENT HISTORY: 01/26/24 Right TKA & 03/14/2024 manipulation, COPD, CAD w/stent,  HTN, ankle fusion, back sg L5 fusion,   PAIN:  NPRS scale: at rest 0/10 and bending knee 3/10 max 10/10 Pain location: right knee anteriorly Pain description: ache & throbbing Aggravating factors:  bending knee Relieving factors: meds, ice, elevation  PRECAUTIONS: None  WEIGHT BEARING RESTRICTIONS: No  FALLS:  Has patient fallen in last 6 months? No  LIVING ENVIRONMENT: Lives with: lives with their spouse and adult grandson Lives in: Mobile home Stairs: Yes:  External: 5 steps; can reach both Has following equipment at home: Single point cane, Environmental consultant - 2 wheeled, Crutches, Wheelchair (manual), [employer], bed side commode, Grab bars, and Ramped entry  OCCUPATION: retired Music therapist  PLOF: Independent  PATIENT GOALS:    walk & bend knee  Next MD visit:  OBJECTIVE:  DIAGNOSTIC FINDINGS: 02/09/2024 X-ray AP and lateral views of the knee reviewed.  Total knee arthroplasty  prosthesis in good position and alignment without any complicating features.  There is no evidence of dislocation, periprosthetic fracture, patella   Patient-Specific Activity Scoring Scheme  "0" represents "unable to perform." "10" represents "able to perform at prior level. 0 1 2 3 4 5 6 7 8 9  10 (Date and Score)   Activity Eval     1. Walking   5    2. Stairs   4    3. Standing ADLs 6   4.    5.    Score 5    Total score = sum of the activity scores/number of activities Minimum detectable change (90%CI) for average score = 2 points Minimum detectable change (90%CI) for single activity score = 3 points  COGNITION: Overall cognitive status: WFL    SENSATION: WFL  EDEMA:  Circumferential:   LLE: above knee 38 cm,  around knee 37.3 cm, below knee 34.5 cm RLE: above knee 42 cm,  around knee 42.8 cm, below knee 36.8 cm  POSTURE: rounded shoulders, forward head, and weight shift left  PALPATION: Right knee: Tenderness along incision especially distally over tibia with some adherence, patella and anterior joint line. Pt denies muscle pain in quad, hamstring or gastroc.   LOWER EXTREMITY ROM:   ROM Right eval Left eval  Hip flexion    Hip extension    Hip abduction    Hip adduction    Hip internal rotation    Hip external rotation    Knee flexion Seated  P: 82* A: 85* Supine A: 71* P: 79*   Knee extension LAQ -22* Standing -18*   Ankle dorsiflexion    Ankle plantarflexion    Ankle inversion    Ankle eversion     (Blank rows = not  tested)  LOWER EXTREMITY MMT:  MMT Right eval Left eval  Hip flexion    Hip extension    Hip abduction    Hip adduction    Hip internal rotation    Hip external rotation    Knee flexion    Knee extension HH dynameter 32.1# 30.2# RLE:LLE 59% HH dynameter 52.9# 53.3#  Ankle dorsiflexion    Ankle plantarflexion    Ankle inversion    Ankle eversion     (Blank rows = not tested)  FUNCTIONAL TESTS:  18 inch chair transfer: requires UE assist and decreased RLE assist moving from extended position sliding back upon arising.   GAIT: Distance walked: 150' Assistive device utilized: Single point cane Level of assistance: SBA / cues for deviations / no balance issues noted Comments: Antalgic pattern with decreased R leg  stance duration, right knee flexed in stance with limited increased flexion for swing, RLE abducted   TODAY'S TREATMENT                                                                          DATE: 03/16/2024: Prone knee hang 2# x 2 min Prone knee hang quad set with 2# 10x3" Prone quad set 2x10x3" Supine quad set with PT overpressure into ext 2x10 Supine SLR with quad set using strap for assist 2x10 Supine heel slide with strap 2x10 Grade II to III tibiofemoral mob for knee flexion Patellar mobilizations all directions STM & TPR hamstrings and quads Recumbent bike half revolutions x4 fwd & bwd   03/15/2024:  Therex:    HEP instruction/performance c cues for techniques, handout provided.  Trial set performed of each for comprehension and symptom assessment.  See below for exercise list  Self-care: PT recommended elevation with leg higher than the heart >/= 2 times per day for >/= 15 minutes.  Patient verbalized understanding  Manual therapy: Passive range of motion with overpressure for knee flexion seated.  PT constantly assessing end range.  PT able to feel quadriceps decreased resistance to stretch with prolonged hold.  PATIENT EDUCATION:  Education  details: HEP, POC, elevation Person educated: Patient Education method: Explanation, Demonstration, Verbal cues, and Handouts Education comprehension: verbalized understanding, returned demonstration, and verbal cues required  HOME EXERCISE PROGRAM: Access Code: [email or web address] URL: [email or web address] Date: 03/15/2024 Prepared by: Lorie Rook  Exercises - Supine Heel Slide with Strap  - 2-4 x daily - 7 x weekly - 1-2 sets - 10 reps - 5 seconds hold - supine quad set with towel roll under ankle  - 2-4 x daily - 7 x weekly - 1-2 sets - 10 reps - 5 seconds hold - supine knee flexion stretch with thigh vertical  - 2-4 x daily - 7 x weekly - 1-2 sets - 10 reps - 5 seconds hold - Seated Knee Flexion Extension AROM   - 2-4 x daily - 7 x weekly - 1-2 sets - 10 reps - 5 seconds hold  ASSESSMENT: CLINICAL IMPRESSION: Heavy focus on ROM s/p MUA. R knee PROM into ext almost similar to L knee. Remains the most painful with flexion.   From eval: Patient is a 68 y.o. who comes to clinic with complaints of right knee pain s/p TKA & MUA with mobility, strength and movement coordination deficits that impair their ability to perform usual daily and recreational functional activities without increase difficulty/symptoms at this time.  Patient to benefit from skilled PT services to address impairments and limitations to improve to previous level of function without restriction secondary to condition.   OBJECTIVE IMPAIRMENTS: Abnormal gait, decreased activity tolerance, decreased balance, decreased knowledge of condition, decreased knowledge of use of DME, decreased mobility, difficulty walking, decreased ROM, decreased strength, increased edema, impaired flexibility, and pain.   ACTIVITY LIMITATIONS: carrying, lifting, sitting, standing, squatting, sleeping, stairs, transfers, and locomotion level  PARTICIPATION LIMITATIONS: laundry, driving, and community activity  PERSONAL FACTORS: Fitness,  Time since onset of injury/illness/exacerbation, and 3+ comorbidities: see PMH are also affecting patient's functional outcome.   REHAB POTENTIAL: Good  CLINICAL DECISION MAKING: Stable/uncomplicated  EVALUATION COMPLEXITY: Low  GOALS: Goals reviewed with patient? Yes  SHORT TERM GOALS: (target date for Short term goals 04/07/2024)   1.  Patient will demonstrate independent use of home exercise program to maintain progress from in clinic treatments. Baseline: See objective data Goal status: Initial  2. PROM right Knee -5* ext to 90* flexion Baseline: See objective data Goal status: Initial  LONG TERM GOALS: (target dates for all long term goals  04/28/2024 )   1. Patient will demonstrate/report pain at worst less than or equal to 2/10 to facilitate minimal limitation in daily activity secondary to pain symptoms. Baseline: See objective data Goal status: Initial   2. Patient will demonstrate independent use of home exercise program to facilitate ability to maintain/progress functional gains from skilled physical therapy services. Baseline: See objective data Goal status: Initial  3.  Patient reports Patient-Specific Activity Score improved the average >/= 8 to indicate improvement in functional activities.  Baseline: SEE OBJECTIVE DATA Goal status: INITIAL   4.  Patient will demonstrate right knee LE MMT 5/5 (within 75% of LLE) throughout to faciltiate usual transfers, stairs, squatting at [facility] for daily life.  Baseline: See objective data Goal status: Initial   5.  PROM right knee -3* ext to 100* flexion Baseline: See objective data Goal status: Initial   6.  AROM right knee standing ext -5* to supine/seated flexion 90* Baseline: See objective data Goal status: Initial   PLAN:  PT FREQUENCY:  3-5x/week for first 2 weeks post Manipulation, then 2x/wk for 4 weeks  PT DURATION: 7 weeks  PLANNED INTERVENTIONS: 97164- PT Re-evaluation, 97750- Physical Performance  Testing, 97110-Therapeutic exercises, 97530- Therapeutic activity, [id number]- Neuromuscular re-education, 97535- Self Care, 47829- Manual therapy, [id number]- Gait training, [phone]- Electrical stimulation (unattended), [phone]- Electrical stimulation (manual), [id number]- Fluidotherapy, Patient/Family education, Balance training, Stair training, Taping, Dry Needling, Joint mobilization, Scar mobilization, and DME instructions  PLAN FOR NEXT SESSION: Review & Update HEP. Manual therapy & therapeutic exercise for range.  Vaso for edema.   Damere Brandenburg April Ma L Zakeria Kulzer, PT, DPT 03/16/2024, 11:52 AM  Date of referral: 03/08/2024 Referring provider: Casilda Clayman, PA-C  Referring diagnosis? Q46.962 (ICD-10-CM) - S/P total knee arthroplasty, right  Treatment diagnosis? (if different than referring diagnosis)  Stiffness of right knee, not elsewhere classified M25.661  Chronic pain of right knee M25.561, G89.29  Muscle weakness (generalized) M62.81  Other abnormalities of gait and mobility R26.89  Localized edema R60.0  What was this (referring dx) caused by? Surgery (Type: TKA)  Lonne Roan of Condition: Initial Onset (within last 3 months)   Laterality: Rt  Current Functional Measure Score: Patient Specific Functional Scale 5/10  Objective measurements identify impairments when they are compared to normal values, the uninvolved extremity, and prior level of function.  [x]  Yes  []  No  Objective assessment of functional ability: Moderate functional limitations   Briefly describe symptoms: right knee range limiting ADLs & gait, pain in right knee  How did symptoms start: surgery  Average pain intensity:  Last 24 hours: 3-10/10  Past week: 3-10/10  How often does the pt experience symptoms? Constantly  How much have the symptoms interfered with usual daily activities? Quite a bit  How has condition changed since care began at this facility? NA - initial visit  In general, how is the patients overall  health? Very Good   BACK PAIN (STarT Back Screening Tool) No

## 2024-03-17 ENCOUNTER — Ambulatory Visit (INDEPENDENT_AMBULATORY_CARE_PROVIDER_SITE_OTHER): Admitting: Physical Therapy

## 2024-03-17 ENCOUNTER — Encounter: Payer: Self-pay | Admitting: Physical Therapy

## 2024-03-17 DIAGNOSIS — M25561 Pain in right knee: Secondary | ICD-10-CM | POA: Diagnosis not present

## 2024-03-17 DIAGNOSIS — G8929 Other chronic pain: Secondary | ICD-10-CM

## 2024-03-17 DIAGNOSIS — M6281 Muscle weakness (generalized): Secondary | ICD-10-CM

## 2024-03-17 DIAGNOSIS — R2689 Other abnormalities of gait and mobility: Secondary | ICD-10-CM

## 2024-03-17 DIAGNOSIS — M25661 Stiffness of right knee, not elsewhere classified: Secondary | ICD-10-CM | POA: Diagnosis not present

## 2024-03-17 DIAGNOSIS — R600 Localized edema: Secondary | ICD-10-CM

## 2024-03-17 NOTE — Therapy (Signed)
 OUTPATIENT PHYSICAL THERAPY TREATMENT   Patient Name: Daniel Hurley MRN: [id number] DOB:08-09-1956, 68 y.o., male Today's Date: 03/17/2024  END OF SESSION:  PT End of Session - 03/17/24 1311     Visit Number 3    Number of Visits 18    Date for PT Re-Evaluation 04/28/24    Authorization Type [employer] Medicare    Authorization Time Period 10% co-insurance    Progress Note Due on Visit 10    PT Start Time 1302    PT Stop Time 1344    PT Time Calculation (min) 42 min    Activity Tolerance Patient tolerated treatment well    Behavior During Therapy WFL for tasks assessed/performed               Past Medical History:  Diagnosis Date   COPD (chronic obstructive pulmonary disease) (HCC)    Coronary artery disease    Hypertension 02/12/2020   Past Surgical History:  Procedure Laterality Date   ANKLE FUSION  2006   BACK SURGERY  2008   L5-5 fusion    CORONARY PRESSURE/FFR STUDY N/A 07/08/2022   Procedure: INTRAVASCULAR PRESSURE WIRE/FFR STUDY;  Surgeon: Odie Benne, MD;  Location: [facility];  Service: Cardiovascular;  Laterality: N/A;   CORONARY STENT INTERVENTION N/A 07/08/2022   Procedure: CORONARY STENT INTERVENTION;  Surgeon: Odie Benne, MD;  Location: [facility];  Service: Cardiovascular;  Laterality: N/A;   INGUINAL HERNIA REPAIR Right 12/27/2023   Procedure: OPEN RIGHT INGUINAL HERNIA REPAIR WITH MESH;  Surgeon: Enid Harry, MD;  Location: [facility] OR;  Service: General;  Laterality: Right;   KNEE CLOSED REDUCTION Right 03/14/2024   Procedure: CLOSED RIGHT KNEE MANIPULATION;  Surgeon: Jasmine Mesi, MD;  Location: [facility] OR;  Service: Orthopedics;  Laterality: Right;   LEFT HEART CATH AND CORONARY ANGIOGRAPHY N/A 07/08/2022   Procedure: LEFT HEART CATH AND CORONARY ANGIOGRAPHY;  Surgeon: Odie Benne, MD;  Location: [facility];  Service: Cardiovascular;  Laterality: N/A;   left wrist surgery     TOTAL KNEE  ARTHROPLASTY Right 01/27/2024   Procedure: RIGHT TOTAL KNEE ARTHROPLASTY;  Surgeon: Jasmine Mesi, MD;  Location: [facility] OR;  Service: Orthopedics;  Laterality: Right;   Patient Active Problem List   Diagnosis Date Noted   Arthritis of right knee 01/30/2024   S/P total knee replacement, right 01/27/2024   Unstable angina (HCC)    Chronic neck pain 11/22/2020   Hypertension 02/12/2020   Acute respiratory failure with hypoxia (HCC) 01/20/2016   Chest pain 01/19/2016   COPD exacerbation (HCC) 01/19/2016   Tobacco abuse 01/19/2016    PCP: Rey Catholic, MD  REFERRING PROVIDER: Casilda Clayman, PA-C   REFERRING DIAG: [id number] (ICD-10-CM) - S/P total knee arthroplasty, right   THERAPY DIAG:  Stiffness of right knee, not elsewhere classified  Chronic pain of right knee  Muscle weakness (generalized)  Other abnormalities of gait and mobility  Localized edema  Rationale for Evaluation and Treatment: Rehabilitation  ONSET DATE: 01/26/24 Right TKA & 03/14/2024 manipulation  SUBJECTIVE:   SUBJECTIVE STATEMENT:  I'm still sore, especially from PT yesterday   From eval: This 68yo male underwent right Total knee Arthroplasty on 01/26/2024 and had HHPT per patient request for 6 weeks.  He had difficulty achieving necessary range of motion so underwent Manipulation under anesthesia 03/14/2024 & referred to OPPT.     PERTINENT HISTORY: 01/26/24 Right TKA & 03/14/2024 manipulation, COPD, CAD w/stent, HTN, ankle fusion,  back sg L5 fusion,   PAIN:  NPRS scale: 0/10 at rest   PRECAUTIONS: None  WEIGHT BEARING RESTRICTIONS: No  FALLS:  Has patient fallen in last 6 months? No  LIVING ENVIRONMENT: Lives with: lives with their spouse and adult grandson Lives in: Mobile home Stairs: Yes: External: 5 steps; can reach both Has following equipment at home: Single point cane, Environmental consultant - 2 wheeled, Crutches, Wheelchair (manual), [employer], bed side commode, Grab bars, and Ramped  entry  OCCUPATION: retired Music therapist  PLOF: Independent  PATIENT GOALS:    walk & bend knee  Next MD visit:  OBJECTIVE:  DIAGNOSTIC FINDINGS: 02/09/2024 X-ray AP and lateral views of the knee reviewed.  Total knee arthroplasty  prosthesis in good position and alignment without any complicating features.  There is no evidence of dislocation, periprosthetic fracture, patella   Patient-Specific Activity Scoring Scheme  "0" represents "unable to perform." "10" represents "able to perform at prior level. 0 1 2 3 4 5 6 7 8 9  10 (Date and Score)   Activity Eval     1. Walking   5    2. Stairs   4    3. Standing ADLs 6   4.    5.    Score 5    Total score = sum of the activity scores/number of activities Minimum detectable change (90%CI) for average score = 2 points Minimum detectable change (90%CI) for single activity score = 3 points  COGNITION: Overall cognitive status: WFL    SENSATION: WFL  EDEMA:  Circumferential:   LLE: above knee 38 cm,  around knee 37.3 cm, below knee 34.5 cm RLE: above knee 42 cm,  around knee 42.8 cm, below knee 36.8 cm  POSTURE: rounded shoulders, forward head, and weight shift left  PALPATION: Right knee: Tenderness along incision especially distally over tibia with some adherence, patella and anterior joint line. Pt denies muscle pain in quad, hamstring or gastroc.   LOWER EXTREMITY ROM:   ROM Right eval Left eval  Hip flexion    Hip extension    Hip abduction    Hip adduction    Hip internal rotation    Hip external rotation    Knee flexion Seated  P: 82* A: 85* Supine A: 71* P: 79*   Knee extension LAQ -22* Standing -18*   Ankle dorsiflexion    Ankle plantarflexion    Ankle inversion    Ankle eversion     (Blank rows = not tested)  LOWER EXTREMITY MMT:  MMT Right eval Left eval  Hip flexion    Hip extension    Hip abduction    Hip adduction    Hip internal rotation    Hip external rotation    Knee flexion     Knee extension HH dynameter 32.1# 30.2# RLE:LLE 59% HH dynameter 52.9# 53.3#  Ankle dorsiflexion    Ankle plantarflexion    Ankle inversion    Ankle eversion     (Blank rows = not tested)  FUNCTIONAL TESTS:  18 inch chair transfer: requires UE assist and decreased RLE assist moving from extended position sliding back upon arising.   GAIT: Distance walked: 150' Assistive device utilized: Single point cane Level of assistance: SBA / cues for deviations / no balance issues noted Comments: Antalgic pattern with decreased R leg stance duration, right knee flexed in stance with limited increased flexion for swing, RLE abducted   TODAY'S TREATMENT  DATE:  03/17/24  Scifit bike partial rotations back and forth x10 minutes seat 10 for ROM and w/u    Knee extension stretch with LE on chair 2# x3 minutes  SAQs over foam roller 2# x15  R LE in available range  Bridges with progressive R knee flexion 3x5 MinA from PT LAQs 2# x15 R LE in available range  Self knee flexion stretch x15 edge of mat table   Percussion gun with towel for padding to R quad sitting edge of mat table mixed with rounds of AAROM for knee flexion with assist from PT     03/16/2024: Prone knee hang 2# x 2 min Prone knee hang quad set with 2# 10x3" Prone quad set 2x10x3" Supine quad set with PT overpressure into ext 2x10 Supine SLR with quad set using strap for assist 2x10 Supine heel slide with strap 2x10 Grade II to III tibiofemoral mob for knee flexion Patellar mobilizations all directions STM & TPR hamstrings and quads Recumbent bike half revolutions x4 fwd & bwd   03/15/2024:  Therex:    HEP instruction/performance c cues for techniques, handout provided.  Trial set performed of each for comprehension and symptom assessment.  See below for exercise list  Self-care: PT recommended elevation with leg higher than the heart >/= 2 times  per day for >/= 15 minutes.  Patient verbalized understanding  Manual therapy: Passive range of motion with overpressure for knee flexion seated.  PT constantly assessing end range.  PT able to feel quadriceps decreased resistance to stretch with prolonged hold.  PATIENT EDUCATION:  Education details: HEP, POC, elevation Person educated: Patient Education method: Explanation, Demonstration, Verbal cues, and Handouts Education comprehension: verbalized understanding, returned demonstration, and verbal cues required  HOME EXERCISE PROGRAM: Access Code: [email or web address] URL: [email or web address] Date: 03/15/2024 Prepared by: Lorie Rook  Exercises - Supine Heel Slide with Strap  - 2-4 x daily - 7 x weekly - 1-2 sets - 10 reps - 5 seconds hold - supine quad set with towel roll under ankle  - 2-4 x daily - 7 x weekly - 1-2 sets - 10 reps - 5 seconds hold - supine knee flexion stretch with thigh vertical  - 2-4 x daily - 7 x weekly - 1-2 sets - 10 reps - 5 seconds hold - Seated Knee Flexion Extension AROM   - 2-4 x daily - 7 x weekly - 1-2 sets - 10 reps - 5 seconds hold  ASSESSMENT: CLINICAL IMPRESSION:  Continued interventions as appropriate for knee ROM following his manipulation. He does relay that he is starting to feel a little better every day but remains sore. Will continue to progress him as able.   From eval: Patient is a 68 y.o. who comes to clinic with complaints of right knee pain s/p TKA & MUA with mobility, strength and movement coordination deficits that impair their ability to perform usual daily and recreational functional activities without increase difficulty/symptoms at this time.  Patient to benefit from skilled PT services to address impairments and limitations to improve to previous level of function without restriction secondary to condition.   OBJECTIVE IMPAIRMENTS: Abnormal gait, decreased activity tolerance, decreased balance, decreased knowledge of  condition, decreased knowledge of use of DME, decreased mobility, difficulty walking, decreased ROM, decreased strength, increased edema, impaired flexibility, and pain.   ACTIVITY LIMITATIONS: carrying, lifting, sitting, standing, squatting, sleeping, stairs, transfers, and locomotion level  PARTICIPATION LIMITATIONS: laundry, driving, and community activity  PERSONAL FACTORS: Fitness, Time since onset of  injury/illness/exacerbation, and 3+ comorbidities: see PMH are also affecting patient's functional outcome.   REHAB POTENTIAL: Good  CLINICAL DECISION MAKING: Stable/uncomplicated  EVALUATION COMPLEXITY: Low   GOALS: Goals reviewed with patient? Yes  SHORT TERM GOALS: (target date for Short term goals 04/07/2024)   1.  Patient will demonstrate independent use of home exercise program to maintain progress from in clinic treatments. Baseline: See objective data Goal status: Initial  2. PROM right Knee -5* ext to 90* flexion Baseline: See objective data Goal status: Initial  LONG TERM GOALS: (target dates for all long term goals  04/28/2024 )   1. Patient will demonstrate/report pain at worst less than or equal to 2/10 to facilitate minimal limitation in daily activity secondary to pain symptoms. Baseline: See objective data Goal status: Initial   2. Patient will demonstrate independent use of home exercise program to facilitate ability to maintain/progress functional gains from skilled physical therapy services. Baseline: See objective data Goal status: Initial  3.  Patient reports Patient-Specific Activity Score improved the average >/= 8 to indicate improvement in functional activities.  Baseline: SEE OBJECTIVE DATA Goal status: INITIAL   4.  Patient will demonstrate right knee LE MMT 5/5 (within 75% of LLE) throughout to faciltiate usual transfers, stairs, squatting at [facility] for daily life.  Baseline: See objective data Goal status: Initial   5.  PROM right knee -3* ext  to 100* flexion Baseline: See objective data Goal status: Initial   6.  AROM right knee standing ext -5* to supine/seated flexion 90* Baseline: See objective data Goal status: Initial   PLAN:  PT FREQUENCY:  3-5x/week for first 2 weeks post Manipulation, then 2x/wk for 4 weeks  PT DURATION: 7 weeks  PLANNED INTERVENTIONS: 97164- PT Re-evaluation, 97750- Physical Performance Testing, 97110-Therapeutic exercises, 97530- Therapeutic activity, [id number]- Neuromuscular re-education, 97535- Self Care, [id number]- Manual therapy, [id number]- Gait training, [phone]- Electrical stimulation (unattended), [phone]- Electrical stimulation (manual), [id number]- Fluidotherapy, Patient/Family education, Balance training, Stair training, Taping, Dry Needling, Joint mobilization, Scar mobilization, and DME instructions  PLAN FOR NEXT SESSION: Review & Update HEP. Manual therapy & therapeutic exercise for range especially flexion.  Vaso for edema PRN   Terrel Ferries, PT, DPT 03/17/24 1:44 PM   Date of referral: 03/08/2024 Referring provider: Casilda Clayman, PA-C  Referring diagnosis? M01.027 (ICD-10-CM) - S/P total knee arthroplasty, right  Treatment diagnosis? (if different than referring diagnosis)  Stiffness of right knee, not elsewhere classified M25.661  Chronic pain of right knee M25.561, G89.29  Muscle weakness (generalized) M62.81  Other abnormalities of gait and mobility R26.89  Localized edema R60.0  What was this (referring dx) caused by? Surgery (Type: TKA)  Lonne Roan of Condition: Initial Onset (within last 3 months)   Laterality: Rt  Current Functional Measure Score: Patient Specific Functional Scale 5/10  Objective measurements identify impairments when they are compared to normal values, the uninvolved extremity, and prior level of function.  [x]  Yes  []  No  Objective assessment of functional ability: Moderate functional limitations   Briefly describe symptoms: right knee range limiting  ADLs & gait, pain in right knee  How did symptoms start: surgery  Average pain intensity:  Last 24 hours: 3-10/10  Past week: 3-10/10  How often does the pt experience symptoms? Constantly  How much have the symptoms interfered with usual daily activities? Quite a bit  How has condition changed since care began at this facility? NA - initial visit  In general, how is the patients overall health? Very Good  BACK PAIN (STarT Back Screening Tool) No

## 2024-03-20 ENCOUNTER — Ambulatory Visit (INDEPENDENT_AMBULATORY_CARE_PROVIDER_SITE_OTHER): Admitting: Rehabilitative and Restorative Service Providers"

## 2024-03-20 ENCOUNTER — Encounter: Payer: Self-pay | Admitting: Rehabilitative and Restorative Service Providers"

## 2024-03-20 DIAGNOSIS — M6281 Muscle weakness (generalized): Secondary | ICD-10-CM | POA: Diagnosis not present

## 2024-03-20 DIAGNOSIS — R2689 Other abnormalities of gait and mobility: Secondary | ICD-10-CM | POA: Diagnosis not present

## 2024-03-20 DIAGNOSIS — R600 Localized edema: Secondary | ICD-10-CM

## 2024-03-20 DIAGNOSIS — M25561 Pain in right knee: Secondary | ICD-10-CM

## 2024-03-20 DIAGNOSIS — M25661 Stiffness of right knee, not elsewhere classified: Secondary | ICD-10-CM | POA: Diagnosis not present

## 2024-03-20 DIAGNOSIS — G8929 Other chronic pain: Secondary | ICD-10-CM

## 2024-03-20 NOTE — Therapy (Signed)
 OUTPATIENT PHYSICAL THERAPY TREATMENT   Patient Name: Daniel Hurley MRN: [id number] DOB:[birth year], 68 y.o., male Today's Date: 03/20/2024  END OF SESSION:  PT End of Session - 03/20/24 1143     Visit Number 4    Number of Visits 18    Date for PT Re-Evaluation 04/28/24    Authorization Type [employer] Medicare 10% co-insurance    Authorization Time Period 03/15/2024 - 05/17/2024    Authorization - Visit Number 4    Authorization - Number of Visits 18    Progress Note Due on Visit 10    PT Start Time 1138    PT Stop Time 1228    PT Time Calculation (min) 50 min    Activity Tolerance Patient tolerated treatment well    Behavior During Therapy [facility] for tasks assessed/performed                Past Medical History:  Diagnosis Date   COPD (chronic obstructive pulmonary disease) (HCC)    Coronary artery disease    Hypertension 02/12/2020   Past Surgical History:  Procedure Laterality Date   ANKLE FUSION  2006   BACK SURGERY  2008   L5-5 fusion    CORONARY PRESSURE/FFR STUDY N/A 07/08/2022   Procedure: INTRAVASCULAR PRESSURE WIRE/FFR STUDY;  Surgeon: Odie Benne, MD;  Location: [facility] INVASIVE CV LAB;  Service: Cardiovascular;  Laterality: N/A;   CORONARY STENT INTERVENTION N/A 07/08/2022   Procedure: CORONARY STENT INTERVENTION;  Surgeon: Odie Benne, MD;  Location: [facility] INVASIVE CV [facility];  Service: Cardiovascular;  Laterality: N/A;   INGUINAL HERNIA REPAIR Right 12/27/2023   Procedure: OPEN RIGHT INGUINAL HERNIA REPAIR WITH MESH;  Surgeon: Enid Harry, MD;  Location: [facility] OR;  Service: General;  Laterality: Right;   KNEE CLOSED REDUCTION Right 03/14/2024   Procedure: CLOSED RIGHT KNEE MANIPULATION;  Surgeon: Jasmine Mesi, MD;  Location: [facility] OR;  Service: Orthopedics;  Laterality: Right;   LEFT HEART CATH AND CORONARY ANGIOGRAPHY N/A 07/08/2022   Procedure: LEFT HEART CATH AND CORONARY ANGIOGRAPHY;  Surgeon: Odie Benne, MD;  Location: [facility]  INVASIVE CV LAB;  Service: Cardiovascular;  Laterality: N/A;   left wrist surgery     TOTAL KNEE ARTHROPLASTY Right 01/27/2024   Procedure: RIGHT TOTAL KNEE ARTHROPLASTY;  Surgeon: Jasmine Mesi, MD;  Location: [facility] OR;  Service: Orthopedics;  Laterality: Right;   Patient Active Problem List   Diagnosis Date Noted   Arthritis of right knee 01/30/2024   S/P total knee replacement, right 01/27/2024   Unstable angina (HCC)    Chronic neck pain 11/22/2020   Hypertension 02/12/2020   Acute respiratory failure with hypoxia (HCC) 01/20/2016   Chest pain 01/19/2016   COPD exacerbation (HCC) 01/19/2016   Tobacco abuse 01/19/2016    PCP: Rey Catholic, MD  REFERRING PROVIDER: Casilda Clayman, PA-C   REFERRING DIAG: [phone] (ICD-10-CM) - S/P total knee arthroplasty, right   THERAPY DIAG:  Stiffness of right knee, not elsewhere classified  Chronic pain of right knee  Muscle weakness (generalized)  Other abnormalities of gait and mobility  Localized edema  Rationale for Evaluation and Treatment: Rehabilitation  ONSET DATE: 01/26/24 Right TKA & 03/14/2024 manipulation  SUBJECTIVE:   SUBJECTIVE STATEMENT: Pt indicated sore/tightness as chief complaint.  Reported increased sore feeling for several days after last visit.   PERTINENT HISTORY: 01/26/24 Right TKA & 03/14/2024 manipulation, COPD, CAD w/stent, HTN, ankle fusion, back sg L5 fusion,   PAIN:    NPRS scale: 2/10 Pain  location: Rt knee  Pain description: sore/tight Aggravating factors: end pains.  Relieving factors: ice   PRECAUTIONS: None  WEIGHT BEARING RESTRICTIONS: No  FALLS:  Has patient fallen in last 6 months? No  LIVING ENVIRONMENT: Lives with: lives with their spouse and adult grandson Lives in: Mobile home Stairs: Yes: External: 5 steps; can reach both Has following equipment at home: Single point cane, Environmental consultant - 2 wheeled, Crutches, Wheelchair (manual), [employer], bed side commode, Grab bars, and  Ramped entry  OCCUPATION: retired Music therapist  PLOF: Independent  PATIENT GOALS:    walk & bend knee  Next MD visit:  OBJECTIVE:  DIAGNOSTIC FINDINGS: 02/09/2024 X-ray AP and lateral views of the knee reviewed.  Total knee arthroplasty  prosthesis in good position and alignment without any complicating features.  There is no evidence of dislocation, periprosthetic fracture, patella   Patient-Specific Activity Scoring Scheme  "0" represents "unable to perform." "10" represents "able to perform at prior level. 0 1 2 3 4 5 6 7 8 9  10 (Date and Score)   Activity Eval  03/15/2024    1. Walking   5    2. Stairs   4    3. Standing ADLs 6   4.    5.    Score 5    Total score = sum of the activity scores/number of activities Minimum detectable change (90%CI) for average score = 2 points Minimum detectable change (90%CI) for single activity score = 3 points  COGNITION: 03/15/2024 Overall cognitive status: WFL    SENSATION: 03/15/2024 WFL  EDEMA:  03/15/2024 Circumferential:   LLE: above knee 38 cm,  around knee 37.3 cm, below knee 34.5 cm RLE: above knee 42 cm,  around knee 42.8 cm, below knee 36.8 cm  POSTURE:  03/15/2024 rounded shoulders, forward head, and weight shift left  PALPATION: 03/15/2024 Right knee: Tenderness along incision especially distally over tibia with some adherence, patella and anterior joint line. Pt denies muscle pain in quad, hamstring or gastroc.   LOWER EXTREMITY ROM:   ROM Right Eval 03/15/2024 Left eval  Hip flexion    Hip extension    Hip abduction    Hip adduction    Hip internal rotation    Hip external rotation    Knee flexion Seated  P: 82* A: 85* Supine A: 71* P: 79*   Knee extension LAQ -22* Standing -18*   Ankle dorsiflexion    Ankle plantarflexion    Ankle inversion    Ankle eversion     (Blank rows = not tested)  LOWER EXTREMITY MMT:  MMT Right Eval 03/15/2024 Left Eval 03/15/2024  Hip flexion    Hip extension    Hip  abduction    Hip adduction    Hip internal rotation    Hip external rotation    Knee flexion    Knee extension HH dynameter 32.1# 30.2# RLE:LLE 59% HH dynameter 52.9# 53.3#  Ankle dorsiflexion    Ankle plantarflexion    Ankle inversion    Ankle eversion     (Blank rows = not tested)  FUNCTIONAL TESTS:  03/15/2024 18 inch chair transfer: requires UE assist and decreased RLE assist moving from extended position sliding back upon arising.   GAIT: 03/15/2024 Distance walked: 150' Assistive device utilized: Single point cane Level of assistance: SBA / cues for deviations / no balance issues noted Comments: Antalgic pattern with decreased R leg stance duration, right knee flexed in stance with limited increased flexion for swing, RLE  abducted                 TODAY'S TREATMENT                                                                          DATE: 03/20/2024 Therex: UBE LE partial circles to full circles after 3 mins, total 8 mins, seat 11  Incline gastroc stretch 30 sec x 3 bilateral Seated Rt leg LAQ with end range pauses each direction with reciprocal movement opposite leg.  Seated heel prop 3 mins Rt leg Supine quad set for extension 5 sec hold x 10 Rt   Heel prop TKE stretch in supine start of vaso to tolerance.  Education for home use with additional time for education   TherActivity (to improve squatting, transfers, stairs) Leg press in available knee flexion range double leg 75 lbs 2x10 with stretch into flexion, single leg Rt  x 15 37 lbs    Manual: Seated Rt knee flexion distraction c IR mobilization c movement.  Contract/relax knee flexion stretch c percussive device on Rt quad to tolerance.     TODAY'S TREATMENT                                                                          DATE: 03/17/24  Scifit bike partial rotations back and forth x10 minutes seat 10 for ROM and w/u  Knee extension stretch with LE on chair 2# x3 minutes  SAQs over foam roller 2#  x15  R LE in available range  Bridges with progressive R knee flexion 3x5 MinA from PT LAQs 2# x15 R LE in available range  Self knee flexion stretch x15 edge of mat table   Percussion gun with towel for padding to R quad sitting edge of mat table mixed with rounds of AAROM for knee flexion with assist from PT   TODAY'S TREATMENT                                                                          DATE: 03/16/2024: Prone knee hang 2# x 2 min Prone knee hang quad set with 2# 10x3" Prone quad set 2x10x3" Supine quad set with PT overpressure into ext 2x10 Supine SLR with quad set using strap for assist 2x10 Supine heel slide with strap 2x10 Grade II to III tibiofemoral mob for knee flexion Patellar mobilizations all directions STM & TPR hamstrings and quads Recumbent bike half revolutions x4 fwd & bwd   TODAY'S TREATMENT  DATE: 03/15/2024:  Therex:    HEP instruction/performance c cues for techniques, handout provided.  Trial set performed of each for comprehension and symptom assessment.  See below for exercise list  Self-care: PT recommended elevation with leg higher than the heart >/= 2 times per day for >/= 15 minutes.  Patient verbalized understanding  Manual therapy: Passive range of motion with overpressure for knee flexion seated.  PT constantly assessing end range.  PT able to feel quadriceps decreased resistance to stretch with prolonged hold.  PATIENT EDUCATION:  03/20/2024 Education details: HEP update Person educated: Patient Education method: Programmer, multimedia, Demonstration, Verbal cues, and Handouts Education comprehension: verbalized understanding, returned demonstration, and verbal cues required  HOME EXERCISE PROGRAM: Access Code: [email or web address] URL: [email or web address] Date: 03/20/2024 Prepared by: Bonna Bustard  Exercises - Supine Heel Slide with Strap  - 2-4 x daily - 7 x weekly -  1-2 sets - 10 reps - 5 seconds hold - supine quad set with towel roll under ankle  - 2-4 x daily - 7 x weekly - 1-2 sets - 10 reps - 5 seconds hold - supine knee flexion stretch with thigh vertical  - 2-4 x daily - 7 x weekly - 1-2 sets - 10 reps - 5 seconds hold - Seated Knee Extension Stretch with Chair  - 2-4 x daily - 7 x weekly - 1 sets - 1 reps - 3-5 mins hold - Seated Long Arc Quad  - 3-5 x daily - 7 x weekly - 1-2 sets - 10 reps - 2 hold - Seated Quad Set  - 3-5 x daily - 7 x weekly - 1 sets - 10 reps - 5 hold - Seated Knee Flexion AAROM  - 2-3 x daily - 7 x weekly - 1 sets - 5 reps - 10-15 hold  ASSESSMENT: CLINICAL IMPRESSION: Ambulation independent in clinic today with noted antalgic gait and lacking TKE in stance.  Education and review of importance of working both extension and flexion mobility gains. Continued skilled PT services indicated to help progress mobility as tolerated.   OBJECTIVE IMPAIRMENTS: Abnormal gait, decreased activity tolerance, decreased balance, decreased knowledge of condition, decreased knowledge of use of DME, decreased mobility, difficulty walking, decreased ROM, decreased strength, increased edema, impaired flexibility, and pain.   ACTIVITY LIMITATIONS: carrying, lifting, sitting, standing, squatting, sleeping, stairs, transfers, and locomotion level  PARTICIPATION LIMITATIONS: laundry, driving, and community activity  PERSONAL FACTORS: Fitness, Time since onset of injury/illness/exacerbation, and 3+ comorbidities: see PMH are also affecting patient's functional outcome.   REHAB POTENTIAL: Good  CLINICAL DECISION MAKING: Stable/uncomplicated  EVALUATION COMPLEXITY: Low   GOALS: Goals reviewed with patient? Yes  SHORT TERM GOALS: (target date for Short term goals 04/07/2024)   1.  Patient will demonstrate independent use of home exercise program to maintain progress from in clinic treatments. Baseline: See objective data Goal status: on going  03/20/2024  2. PROM right Knee -5* ext to 90* flexion Baseline: See objective data Goal status: on going 03/20/2024  LONG TERM GOALS: (target dates for all long term goals  04/28/2024 )   1. Patient will demonstrate/report pain at worst less than or equal to 2/10 to facilitate minimal limitation in daily activity secondary to pain symptoms. Baseline: See objective data Goal status: Initial   2. Patient will demonstrate independent use of home exercise program to facilitate ability to maintain/progress functional gains from skilled physical therapy services. Baseline: See objective data Goal status: Initial  3.  Patient reports Patient-Specific Activity Score improved  the average >/= 8 to indicate improvement in functional activities.  Baseline: SEE OBJECTIVE DATA Goal status: INITIAL   4.  Patient will demonstrate right knee LE MMT 5/5 (within 75% of LLE) throughout to faciltiate usual transfers, stairs, squatting at [facility] for daily life.  Baseline: See objective data Goal status: Initial   5.  PROM right knee -3* ext to 100* flexion Baseline: See objective data Goal status: Initial   6.  AROM right knee standing ext -5* to supine/seated flexion 90* Baseline: See objective data Goal status: Initial   PLAN:  PT FREQUENCY:  3-5x/week for first 2 weeks post Manipulation, then 2x/wk for 4 weeks  PT DURATION: 7 weeks  PLANNED INTERVENTIONS: 97164- PT Re-evaluation, 97750- Physical Performance Testing, 97110-Therapeutic exercises, 97530- Therapeutic activity, [id number]- Neuromuscular re-education, 97535- Self Care, 95284- Manual therapy, [id number]- Gait training, [phone]- Electrical stimulation (unattended), [phone]- Electrical stimulation (manual), [id number]- Fluidotherapy, Patient/Family education, Balance training, Stair training, Taping, Dry Needling, Joint mobilization, Scar mobilization, and DME instructions  PLAN FOR NEXT SESSION: Extension/flexion  gains, progressive strengthening as able.     Bonna Bustard, PT, DPT, OCS, ATC 03/20/24  12:22 PM    Date of referral: 03/08/2024 Referring provider: Casilda Clayman, PA-C  Referring diagnosis? O53.664 (ICD-10-CM) - S/P total knee arthroplasty, right  Treatment diagnosis? (if different than referring diagnosis)  Stiffness of right knee, not elsewhere classified M25.661  Chronic pain of right knee M25.561, G89.29  Muscle weakness (generalized) M62.81  Other abnormalities of gait and mobility R26.89  Localized edema R60.0  What was this (referring dx) caused by? Surgery (Type: TKA)  Lonne Roan of Condition: Initial Onset (within last 3 months)   Laterality: Rt  Current Functional Measure Score: Patient Specific Functional Scale 5/10  Objective measurements identify impairments when they are compared to normal values, the uninvolved extremity, and prior level of function.  [x]  Yes  []  No  Objective assessment of functional ability: Moderate functional limitations   Briefly describe symptoms: right knee range limiting ADLs & gait, pain in right knee  How did symptoms start: surgery  Average pain intensity:  Last 24 hours: 3-10/10  Past week: 3-10/10  How often does the pt experience symptoms? Constantly  How much have the symptoms interfered with usual daily activities? Quite a bit  How has condition changed since care began at this facility? NA - initial visit  In general, how is the patients overall health? Very Good   BACK PAIN (STarT Back Screening Tool) No

## 2024-03-22 ENCOUNTER — Encounter: Payer: Self-pay | Admitting: Physical Therapy

## 2024-03-22 ENCOUNTER — Ambulatory Visit (INDEPENDENT_AMBULATORY_CARE_PROVIDER_SITE_OTHER): Admitting: Physical Therapy

## 2024-03-22 ENCOUNTER — Ambulatory Visit: Admitting: Orthopedic Surgery

## 2024-03-22 DIAGNOSIS — M25661 Stiffness of right knee, not elsewhere classified: Secondary | ICD-10-CM | POA: Diagnosis not present

## 2024-03-22 DIAGNOSIS — Z96651 Presence of right artificial knee joint: Secondary | ICD-10-CM

## 2024-03-22 DIAGNOSIS — M6281 Muscle weakness (generalized): Secondary | ICD-10-CM | POA: Diagnosis not present

## 2024-03-22 DIAGNOSIS — M25561 Pain in right knee: Secondary | ICD-10-CM

## 2024-03-22 DIAGNOSIS — R600 Localized edema: Secondary | ICD-10-CM

## 2024-03-22 DIAGNOSIS — G8929 Other chronic pain: Secondary | ICD-10-CM

## 2024-03-22 DIAGNOSIS — R2689 Other abnormalities of gait and mobility: Secondary | ICD-10-CM

## 2024-03-22 NOTE — Therapy (Signed)
 OUTPATIENT PHYSICAL THERAPY TREATMENT   Patient Name: Daniel Hurley MRN: [id number] DOB:26-Aug-1956, 68 y.o., male Today's Date: 03/22/2024  END OF SESSION:  PT End of Session - 03/22/24 1009     Visit Number 5    Number of Visits 18    Date for PT Re-Evaluation 04/28/24    Authorization Type [employer] Medicare 10% co-insurance    Authorization Time Period 03/15/2024 - 05/17/2024    Authorization - Visit Number 5    Authorization - Number of Visits 18    Progress Note Due on Visit 10    PT Start Time 1015    PT Stop Time 1110    PT Time Calculation (min) 55 min    Activity Tolerance Patient tolerated treatment well    Behavior During Therapy [facility] for tasks assessed/performed                 Past Medical History:  Diagnosis Date   COPD (chronic obstructive pulmonary disease) (HCC)    Coronary artery disease    Hypertension 02/12/2020   Past Surgical History:  Procedure Laterality Date   ANKLE FUSION  2006   BACK SURGERY  2008   L5-5 fusion    CORONARY PRESSURE/FFR STUDY N/A 07/08/2022   Procedure: INTRAVASCULAR PRESSURE WIRE/FFR STUDY;  Surgeon: Odie Benne, MD;  Location: [facility] IN[facility]IVE [facility];  Service: Cardiovascular;  Laterality: N/A;   CORONARY STENT INTERVENTION N/A 07/08/2022   Procedure: CORONARY STENT INTERVENTION;  Surgeon: Odie Benne, MD;  Location: [facility] INV[facility] CV [facility];  Service: Cardiovascular;  Laterality: N/A;   INGUINAL HERNIA REPAIR Right 12/27/2023   Procedure: OPEN RIGHT INGUINAL HERNIA REPAIR WITH MESH;  Surgeon: Enid Harry, MD;  Location: [facility] OR;  Service: General;  Laterality: Right;   KNEE CLOSED REDUCTION Right 03/14/2024   Procedure: CLOSED RIGHT KNEE MANIPULATION;  Surgeon: Jasmine Mesi, MD;  Location: [facility] OR;  Service: Orthopedics;  Laterality: Right;   LEFT HEART CATH AND CORONARY ANGIOGRAPHY N/A 07/08/2022   Procedure: LEFT HEART CATH AND CORONARY ANGIOGRAPHY;  Surgeon: Odie Benne, MD;  Location: [facility]  INVASIVE CV LAB;  Service: Cardiovascular;  Laterality: N/A;   left wrist surgery     TOTAL KNEE ARTHROPLASTY Right 01/27/2024   Procedure: RIGHT TOTAL KNEE ARTHROPLASTY;  Surgeon: Jasmine Mesi, MD;  Location: [facility] OR;  Service: Orthopedics;  Laterality: Right;   Patient Active Problem List   Diagnosis Date Noted   Arthritis of right knee 01/30/2024   S/P total knee replacement, right 01/27/2024   Unstable angina (HCC)    Chronic neck pain 11/22/2020   Hypertension 02/12/2020   Acute respiratory failure with hypoxia (HCC) 01/20/2016   Chest pain 01/19/2016   COPD exacerbation (HCC) 01/19/2016   Tobacco abuse 01/19/2016    PCP: Rey Catholic, MD  REFERRING PROVIDER: Casilda Clayman, PA-C   REFERRING DIAG: [phone] (ICD-10-CM) - S/P total knee arthroplasty, right   THERAPY DIAG:  Stiffness of right knee, not elsewhere classified  Chronic pain of right knee  Muscle weakness (generalized)  Other abnormalities of gait and mobility  Localized edema  Rationale for Evaluation and Treatment: Rehabilitation  ONSET DATE: 01/26/24 Right TKA & 03/14/2024 manipulation  SUBJECTIVE:   SUBJECTIVE STATEMENT: Doing pretty well; feels like his knee is still pretty tight   PERTINENT HISTORY: 01/26/24 Right TKA & 03/14/2024 manipulation, COPD, CAD w/stent, HTN, ankle fusion, back sg L5 fusion,   PAIN:    NPRS scale: 2/10 Pain location: Rt knee  Pain  description: sore/tight Aggravating factors: end pains.  Relieving factors: ice   PRECAUTIONS: None  WEIGHT BEARING RESTRICTIONS: No  FALLS:  Has patient fallen in last 6 months? No  LIVING ENVIRONMENT: Lives with: lives with their spouse and adult grandson Lives in: Mobile home Stairs: Yes: External: 5 steps; can reach both Has following equipment at home: Single point cane, Environmental consultant - 2 wheeled, Crutches, Wheelchair (manual), [employer], bed side commode, Grab bars, and Ramped entry  OCCUPATION: retired  Music therapist  PLOF: Independent  PATIENT GOALS:    walk & bend knee  Next MD visit:  OBJECTIVE:  DIAGNOSTIC FINDINGS: 02/09/2024 X-ray AP and lateral views of the knee reviewed.  Total knee arthroplasty  prosthesis in good position and alignment without any complicating features.  There is no evidence of dislocation, periprosthetic fracture, patella   Patient-Specific Activity Scoring Scheme  "0" represents "unable to perform." "10" represents "able to perform at prior level. 0 1 2 3 4 5 6 7 8 9  10 (Date and Score)   Activity Eval  03/15/2024    1. Walking   5    2. Stairs   4    3. Standing ADLs 6   4.    5.    Score 5    Total score = sum of the activity scores/number of activities Minimum detectable change (90%CI) for average score = 2 points Minimum detectable change (90%CI) for single activity score = 3 points  COGNITION: 03/15/2024 Overall cognitive status: WFL    SENSATION: 03/15/2024 WFL  EDEMA:  03/15/2024 Circumferential:   LLE: above knee 38 cm,  around knee 37.3 cm, below knee 34.5 cm RLE: above knee 42 cm,  around knee 42.8 cm, below knee 36.8 cm  POSTURE:  03/15/2024 rounded shoulders, forward head, and weight shift left  PALPATION: 03/15/2024 Right knee: Tenderness along incision especially distally over tibia with some adherence, patella and anterior joint line. Pt denies muscle pain in quad, hamstring or gastroc.   LOWER EXTREMITY ROM:   ROM Right Eval 03/15/2024 Left eval  Hip flexion    Hip extension    Hip abduction    Hip adduction    Hip internal rotation    Hip external rotation    Knee flexion Seated  P: 82* A: 85* Supine A: 71* P: 79* A: 85 P: 100  Knee extension LAQ -22* Standing -18* A: -12 (seated LAQ) P: -7  Ankle dorsiflexion    Ankle plantarflexion    Ankle inversion    Ankle eversion     (Blank rows = not tested)  LOWER EXTREMITY MMT:  MMT Right Eval 03/15/2024 Left Eval 03/15/2024  Hip flexion    Hip extension    Hip  abduction    Hip adduction    Hip internal rotation    Hip external rotation    Knee flexion    Knee extension HH dynameter 32.1# 30.2# RLE:LLE 59% HH dynameter 52.9# 53.3#  Ankle dorsiflexion    Ankle plantarflexion    Ankle inversion    Ankle eversion     (Blank rows = not tested)  FUNCTIONAL TESTS:  03/15/2024 18 inch chair transfer: requires UE assist and decreased RLE assist moving from extended position sliding back upon arising.   GAIT: 03/15/2024 Distance walked: 150' Assistive device utilized: Single point cane Level of assistance: SBA / cues for deviations / no balance issues noted Comments: Antalgic pattern with decreased R leg stance duration, right knee flexed in stance with limited increased flexion  for swing, RLE abducted                 TODAY'S TREATMENT 03/22/24 TherEx UBE LEs only seat 10 x 8 min; partial to full revolutions Seated AA Rt knee flexion 10 x 10 sec hold; LLE providing overpressure Seated LAQ 2x10 on Rt; 5#  Heel prop during vaso x 5 min for extension  TherAct Leg Press bil 75# 3x10 with 1 min flexion holds between sets; RLE only 37# 3x10  Manual Seated Rt knee flexion with end range holds; strategies to decrease hip hike needed with contract/relax  Modalities Vaso x 10 min med pressure; 34 deg in elevation   03/20/2024 Therex: UBE LE partial circles to full circles after 3 mins, total 8 mins, seat 11  Incline gastroc stretch 30 sec x 3 bilateral Seated Rt leg LAQ with end range pauses each direction with reciprocal movement opposite leg.  Seated heel prop 3 mins Rt leg Supine quad set for extension 5 sec hold x 10 Rt   Heel prop TKE stretch in supine start of vaso to tolerance.  Education for home use with additional time for education   TherActivity (to improve squatting, transfers, stairs) Leg press in available knee flexion range double leg 75 lbs 2x10 with stretch into flexion, single leg Rt  x 15 37 lbs    Manual: Seated Rt  knee flexion distraction c IR mobilization c movement.  Contract/relax knee flexion stretch c percussive device on Rt quad to tolerance.     03/17/24  Scifit bike partial rotations back and forth x10 minutes seat 10 for ROM and w/u  Knee extension stretch with LE on chair 2# x3 minutes  SAQs over foam roller 2# x15  R LE in available range  Bridges with progressive R knee flexion 3x5 MinA from PT LAQs 2# x15 R LE in available range  Self knee flexion stretch x15 edge of mat table   Percussion gun with towel for padding to R quad sitting edge of mat table mixed with rounds of AAROM for knee flexion with assist from PT   03/16/2024: Prone knee hang 2# x 2 min Prone knee hang quad set with 2# 10x3" Prone quad set 2x10x3" Supine quad set with PT overpressure into ext 2x10 Supine SLR with quad set using strap for assist 2x10 Supine heel slide with strap 2x10 Grade II to III tibiofemoral mob for knee flexion Patellar mobilizations all directions STM & TPR hamstrings and quads Recumbent bike half revolutions x4 fwd & bwd   PATIENT EDUCATION:  03/20/2024 Education details: HEP update Person educated: Patient Education method: Programmer, multimedia, Demonstration, Verbal cues, and Handouts Education comprehension: verbalized understanding, returned demonstration, and verbal cues required  HOME EXERCISE PROGRAM: Access Code: [email or web address] URL: [email or web address] Date: 03/20/2024 Prepared by: Bonna Bustard  Exercises - Supine Heel Slide with Strap  - 2-4 x daily - 7 x weekly - 1-2 sets - 10 reps - 5 seconds hold - supine quad set with towel roll under ankle  - 2-4 x daily - 7 x weekly - 1-2 sets - 10 reps - 5 seconds hold - supine knee flexion stretch with thigh vertical  - 2-4 x daily - 7 x weekly - 1-2 sets - 10 reps - 5 seconds hold - Seated Knee Extension Stretch with Chair  - 2-4 x daily - 7 x weekly - 1 sets - 1 reps - 3-5 mins hold - Seated Long Arc Quad  - 3-5 x daily - 7  x  weekly - 1-2 sets - 10 reps - 2 hold - Seated Quad Set  - 3-5 x daily - 7 x weekly - 1 sets - 10 reps - 5 hold - Seated Knee Flexion AAROM  - 2-3 x daily - 7 x weekly - 1 sets - 5 reps - 10-15 hold  ASSESSMENT: CLINICAL IMPRESSION: Pt tolerated session well today with good improvement in PROM noted today.  Overall progressing well with PT.  Continue skilled PT.   OBJECTIVE IMPAIRMENTS: Abnormal gait, decreased activity tolerance, decreased balance, decreased knowledge of condition, decreased knowledge of use of DME, decreased mobility, difficulty walking, decreased ROM, decreased strength, increased edema, impaired flexibility, and pain.   ACTIVITY LIMITATIONS: carrying, lifting, sitting, standing, squatting, sleeping, stairs, transfers, and locomotion level  PARTICIPATION LIMITATIONS: laundry, driving, and community activity  PERSONAL FACTORS: Fitness, Time since onset of injury/illness/exacerbation, and 3+ comorbidities: see PMH are also affecting patient's functional outcome.   REHAB POTENTIAL: Good  CLINICAL DECISION MAKING: Stable/uncomplicated  EVALUATION COMPLEXITY: Low   GOALS: Goals reviewed with patient? Yes  SHORT TERM GOALS: (target date for Short term goals 04/07/2024)   1.  Patient will demonstrate independent use of home exercise program to maintain progress from in clinic treatments. Baseline: See objective data Goal status: on going 03/20/2024  2. PROM right Knee -5* ext to 90* flexion Baseline: See objective data Goal status: on going 03/20/2024  LONG TERM GOALS: (target dates for all long term goals  04/28/2024 )   1. Patient will demonstrate/report pain at worst less than or equal to 2/10 to facilitate minimal limitation in daily activity secondary to pain symptoms. Baseline: See objective data Goal status: Initial   2. Patient will demonstrate independent use of home exercise program to facilitate ability to maintain/progress functional gains from skilled  physical therapy services. Baseline: See objective data Goal status: Initial  3.  Patient reports Patient-Specific Activity Score improved the average >/= 8 to indicate improvement in functional activities.  Baseline: SEE OBJECTIVE DATA Goal status: INITIAL   4.  Patient will demonstrate right knee LE MMT 5/5 (within 75% of LLE) throughout to faciltiate usual transfers, stairs, squatting at [facility] for daily life.  Baseline: See objective data Goal status: Initial   5.  PROM right knee -3* ext to 100* flexion Baseline: See objective data Goal status: Initial   6.  AROM right knee standing ext -5* to supine/seated flexion 90* Baseline: See objective data Goal status: Initial   PLAN:  PT FREQUENCY:  3-5x/week for first 2 weeks post Manipulation, then 2x/wk for 4 weeks  PT DURATION: 7 weeks  PLANNED INTERVENTIONS: 97164- PT Re-evaluation, 97750- Physical Performance Testing, 97110-Therapeutic exercises, 97530- Therapeutic activity, [id number]- Neuromuscular re-education, 97535- Self Care, 53664- Manual therapy, [id number]- Gait training, [phone]- Electrical stimulation (unattended), [phone]- Electrical stimulation (manual), [id number]- Fluidotherapy, Patient/Family education, Balance training, Stair training, Taping, Dry Needling, Joint mobilization, Scar mobilization, and DME instructions  PLAN FOR NEXT SESSION: continue to work on ROM, progressive strengthening as able.    Marley Simmers, PT, DPT 03/22/24 11:13 AM     Date of referral: 03/08/2024 Referring provider: Casilda Clayman, PA-C  Referring diagnosis? G38.756 (ICD-10-CM) - S/P total knee arthroplasty, right  Treatment diagnosis? (if different than referring diagnosis)  Stiffness of right knee, not elsewhere classified M25.661  Chronic pain of right knee M25.561, G89.29  Muscle weakness (generalized) M62.81  Other abnormalities of gait and mobility R26.89  Localized edema R60.0  What was this (referring dx) caused  by?  Surgery (Type: TKA)  Lonne Roan of Condition: Initial Onset (within last 3 months)   Laterality: Rt  Current Functional Measure Score: Patient Specific Functional Scale 5/10  Objective measurements identify impairments when they are compared to normal values, the uninvolved extremity, and prior level of function.  [x]  Yes  []  No  Objective assessment of functional ability: Moderate functional limitations   Briefly describe symptoms: right knee range limiting ADLs & gait, pain in right knee  How did symptoms start: surgery  Average pain intensity:  Last 24 hours: 3-10/10  Past week: 3-10/10  How often does the pt experience symptoms? Constantly  How much have the symptoms interfered with usual daily activities? Quite a bit  How has condition changed since care began at this facility? NA - initial visit  In general, how is the patients overall health? Very Good   BACK PAIN (STarT Back Screening Tool) No

## 2024-03-24 ENCOUNTER — Encounter: Payer: Self-pay | Admitting: Rehabilitative and Restorative Service Providers"

## 2024-03-24 ENCOUNTER — Ambulatory Visit (INDEPENDENT_AMBULATORY_CARE_PROVIDER_SITE_OTHER): Admitting: Rehabilitative and Restorative Service Providers"

## 2024-03-24 DIAGNOSIS — M6281 Muscle weakness (generalized): Secondary | ICD-10-CM | POA: Diagnosis not present

## 2024-03-24 DIAGNOSIS — R600 Localized edema: Secondary | ICD-10-CM

## 2024-03-24 DIAGNOSIS — G8929 Other chronic pain: Secondary | ICD-10-CM

## 2024-03-24 DIAGNOSIS — R2689 Other abnormalities of gait and mobility: Secondary | ICD-10-CM

## 2024-03-24 DIAGNOSIS — M25561 Pain in right knee: Secondary | ICD-10-CM | POA: Diagnosis not present

## 2024-03-24 DIAGNOSIS — M25661 Stiffness of right knee, not elsewhere classified: Secondary | ICD-10-CM

## 2024-03-24 NOTE — Therapy (Addendum)
 OUTPATIENT PHYSICAL THERAPY TREATMENT NOTE   Patient Name: Daniel Hurley MRN: [id number] DOB:1955-11-15, 68 y.o., male Today's Date: 03/24/2024  END OF SESSION:  PT End of Session - 03/24/24 1152     Visit Number 6    Number of Visits 18    Date for PT Re-Evaluation 04/28/24    Authorization Type [employer] Medicare 10% co-insurance    Authorization Time Period 03/15/2024 - 05/17/2024    Authorization - Number of Visits 18    Progress Note Due on Visit 10    PT Start Time 1147    PT Stop Time 1237    PT Time Calculation (min) 50 min    Activity Tolerance Patient tolerated treatment well;No increased pain;Patient limited by pain    Behavior During Therapy [facility] for tasks assessed/performed             Past Medical History:  Diagnosis Date   COPD (chronic obstructive pulmonary disease) (HCC)    Coronary artery disease    Hypertension 02/12/2020   Past Surgical History:  Procedure Laterality Date   ANKLE FUSION  2006   BACK SURGERY  2008   L5-5 fusion    CORONARY PRESSURE/FFR STUDY N/A 07/08/2022   Procedure: INTRAVASCULAR PRESSURE WIRE/FFR STUDY;  Surgeon: Odie Benne, MD;  Location: [facility];  Service: Cardiovascular;  Laterality: N/A;   CORONARY STENT INTERVENTION N/A 07/08/2022   Procedure: CORONARY STENT INTERVENTION;  Surgeon: Odie Benne, MD;  Location: [facility];  Service: Cardiovascular;  Laterality: N/A;   INGUINAL HERNIA REPAIR Right 12/27/2023   Procedure: OPEN RIGHT INGUINAL HERNIA REPAIR WITH MESH;  Surgeon: Enid Harry, MD;  Location: [facility] OR;  Service: General;  Laterality: Right;   KNEE CLOSED REDUCTION Right 03/14/2024   Procedure: CLOSED RIGHT KNEE MANIPULATION;  Surgeon: Jasmine Mesi, MD;  Location: [facility] OR;  Service: Orthopedics;  Laterality: Right;   LEFT HEART CATH AND CORONARY ANGIOGRAPHY N/A 07/08/2022   Procedure: LEFT HEART CATH AND CORONARY ANGIOGRAPHY;  Surgeon: Odie Benne, MD;  Location: [facility]  INV[facility] LAB;  Service: Cardiovascular;  Laterality: N/A;   left wrist surgery     TOTAL KNEE ARTHROPLASTY Right 01/27/2024   Procedure: RIGHT TOTAL KNEE ARTHROPLASTY;  Surgeon: Jasmine Mesi, MD;  Location: [facility] OR;  Service: Orthopedics;  Laterality: Right;   Patient Active Problem List   Diagnosis Date Noted   Arthritis of right knee 01/30/2024   S/P total knee replacement, right 01/27/2024   Unstable angina (HCC)    Chronic neck pain 11/22/2020   Hypertension 02/12/2020   Acute respiratory failure with hypoxia (HCC) 01/20/2016   Chest pain 01/19/2016   COPD exacerbation (HCC) 01/19/2016   Tobacco abuse 01/19/2016    PCP: Rey Catholic, MD  REFERRING PROVIDER: Casilda Clayman, PA-C   REFERRING DIAG: [phone] (ICD-10-CM) - S/P total knee arthroplasty, right   THERAPY DIAG:  Stiffness of right knee, not elsewhere classified  Chronic pain of right knee  Muscle weakness (generalized)  Other abnormalities of gait and mobility  Localized edema  Rationale for Evaluation and Treatment: Rehabilitation  ONSET DATE: 01/26/24 Right TKA & 03/14/2024 manipulation  SUBJECTIVE:   SUBJECTIVE STATEMENT: Kirke reports good HEP compliance.  "You don't know how bad I want to get this thing well."  He reports Dr. Rozelle Corning is OK with him playing golf next week.   PERTINENT HISTORY: 01/26/24 Right TKA & 03/14/2024 manipulation, COPD, CAD w/stent, HTN, ankle fusion, back sg L5 fusion,  PAIN:    NPRS scale: 0-5/10 this week on a 10/10 Pain location: Rt knee  Pain description: sore/tight Aggravating factors: end range pains.  Relieving factors: ice   PRECAUTIONS: None  WEIGHT BEARING RESTRICTIONS: No  FALLS:  Has patient fallen in last 6 months? No  LIVING ENVIRONMENT: Lives with: lives with their spouse and adult grandson Lives in: Mobile home Stairs: Yes: External: 5 steps; can reach both Has following equipment at home: Single point cane, Environmental consultant - 2 wheeled, Crutches,  Wheelchair (manual), [employer], bed side commode, Grab bars, and Ramped entry  OCCUPATION: retired Music therapist  PLOF: Independent  PATIENT GOALS:    walk & bend knee  Next MD visit:  OBJECTIVE:  DIAGNOSTIC FINDINGS: 02/09/2024 X-ray AP and lateral views of the knee reviewed.  Total knee arthroplasty  prosthesis in good position and alignment without any complicating features.  There is no evidence of dislocation, periprosthetic fracture, patella   Patient-Specific Activity Scoring Scheme  "0" represents "unable to perform." "10" represents "able to perform at prior level. 0 1 2 3 4 5 6 7 8 9  10 (Date and Score)   Activity Eval  03/15/2024    1. Walking   5    2. Stairs   4    3. Standing ADLs 6   4.    5.    Score 5    Total score = sum of the activity scores/number of activities Minimum detectable change (90%CI) for average score = 2 points Minimum detectable change (90%CI) for single activity score = 3 points  COGNITION: 03/15/2024 Overall cognitive status: WFL    SENSATION: 03/15/2024 WFL  EDEMA:  03/15/2024 Circumferential:   LLE: above knee 38 cm,  around knee 37.3 cm, below knee 34.5 cm RLE: above knee 42 cm,  around knee 42.8 cm, below knee 36.8 cm  POSTURE:  03/15/2024 rounded shoulders, forward head, and weight shift left  PALPATION: 03/15/2024 Right knee: Tenderness along incision especially distally over tibia with some adherence, patella and anterior joint line. Pt denies muscle pain in quad, hamstring or gastroc.   LOWER EXTREMITY ROM:   ROM Right Eval 03/15/2024 Left eval Right 03/24/2024  Hip flexion     Hip extension     Hip abduction     Hip adduction     Hip internal rotation     Hip external rotation     Knee flexion Seated  P: 82* A: 85* Supine A: 71* P: 79* A: 85 P: 100 Active 92  Knee extension LAQ -22* Standing -18* A: -12 (seated LAQ) P: -7 Active lacks 6   Ankle dorsiflexion     Ankle plantarflexion     Ankle inversion      Ankle eversion      (Blank rows = not tested)  LOWER EXTREMITY MMT:  MMT Right Eval 03/15/2024 Left Eval 03/15/2024  Hip flexion    Hip extension    Hip abduction    Hip adduction    Hip internal rotation    Hip external rotation    Knee flexion    Knee extension HH dynameter 32.1# 30.2# RLE:LLE 59% HH dynameter 52.9# 53.3#  Ankle dorsiflexion    Ankle plantarflexion    Ankle inversion    Ankle eversion     (Blank rows = not tested)  FUNCTIONAL TESTS:  03/15/2024 18 inch chair transfer: requires UE assist and decreased RLE assist moving from extended position sliding back upon arising.   GAIT: 03/15/2024 Distance walked: 150'  Assistive device utilized: Single point cane Level of assistance: SBA / cues for deviations / no balance issues noted Comments: Antalgic pattern with decreased R leg stance duration, right knee flexed in stance with limited increased flexion for swing, RLE abducted                 TODAY'S TREATMENT 03/24/2024 Recumbent bike Seat 6 for 5 minutes Seated knee flexion with PT overpressure 10 x 10 seconds Supine knee flexion with belt (quad set between reps) 10 x 5 seconds Quadriceps sets with right heel prop 10 x 5 seconds  Functional Activities: Double leg Press 75# 12 reps full extension and stretch into flexion  Single leg Press 37# Right only full extension and stretch into flexion (not ready to increase weight with single leg) 10 reps    03/22/24 TherEx UBE LEs only seat 10 x 8 min; partial to full revolutions Seated AA Rt knee flexion 10 x 10 sec hold; LLE providing overpressure Seated LAQ 2x10 on Rt; 5#  Heel prop during vaso x 5 min for extension  TherAct Leg Press bil 75# 3x10 with 1 min flexion holds between sets; RLE only 37# 3x10  Manual Seated Rt knee flexion with end range holds; strategies to decrease hip hike needed with contract/relax  Modalities Vaso x 10 min med pressure; 34 deg in elevation   03/20/2024 Therex: UBE LE  partial circles to full circles after 3 mins, total 8 mins, seat 11  Incline gastroc stretch 30 sec x 3 bilateral Seated Rt leg LAQ with end range pauses each direction with reciprocal movement opposite leg.  Seated heel prop 3 mins Rt leg Supine quad set for extension 5 sec hold x 10 Rt   Heel prop TKE stretch in supine start of vaso to tolerance.  Education for home use with additional time for education   TherActivity (to improve squatting, transfers, stairs) Leg press in available knee flexion range double leg 75 lbs 2x10 with stretch into flexion, single leg Rt  x 15 37 lbs    Manual: Seated Rt knee flexion distraction c IR mobilization c movement.  Contract/relax knee flexion stretch c percussive device on Rt quad to tolerance.    PATIENT EDUCATION:  03/20/2024 Education details: HEP update Person educated: Patient Education method: Programmer, multimedia, Demonstration, Verbal cues, and Handouts Education comprehension: verbalized understanding, returned demonstration, and verbal cues required  HOME EXERCISE PROGRAM: Access Code: [email or web address] URL: [email or web address] Date: 03/20/2024 Prepared by: Bonna Bustard  Exercises - Supine Heel Slide with Strap  - 2-4 x daily - 7 x weekly - 1-2 sets - 10 reps - 5 seconds hold - supine quad set with towel roll under ankle  - 2-4 x daily - 7 x weekly - 1-2 sets - 10 reps - 5 seconds hold - supine knee flexion stretch with thigh vertical  - 2-4 x daily - 7 x weekly - 1-2 sets - 10 reps - 5 seconds hold - Seated Knee Extension Stretch with Chair  - 2-4 x daily - 7 x weekly - 1 sets - 1 reps - 3-5 mins hold - Seated Long Arc Quad  - 3-5 x daily - 7 x weekly - 1-2 sets - 10 reps - 2 hold - Seated Quad Set  - 3-5 x daily - 7 x weekly - 1 sets - 10 reps - 5 hold - Seated Knee Flexion AAROM  - 2-3 x daily - 7 x weekly - 1 sets - 5 reps - 10-15 hold  ASSESSMENT: CLINICAL IMPRESSION: AROM was 0 - 6 - 92 degrees today.  Silvia is giving  great effort in the office and he reports good compliance with his exercises.  Active range of motion (flexion and extension) remain high priority along with quadriceps strengthening and edema control.   OBJECTIVE IMPAIRMENTS: Abnormal gait, decreased activity tolerance, decreased balance, decreased knowledge of condition, decreased knowledge of use of DME, decreased mobility, difficulty walking, decreased ROM, decreased strength, increased edema, impaired flexibility, and pain.   ACTIVITY LIMITATIONS: carrying, lifting, sitting, standing, squatting, sleeping, stairs, transfers, and locomotion level  PARTICIPATION LIMITATIONS: laundry, driving, and community activity  PERSONAL FACTORS: Fitness, Time since onset of injury/illness/exacerbation, and 3+ comorbidities: see PMH are also affecting patient's functional outcome.   REHAB POTENTIAL: Good  CLINICAL DECISION MAKING: Stable/uncomplicated  EVALUATION COMPLEXITY: Low   GOALS: Goals reviewed with patient? Yes  SHORT TERM GOALS: (target date for Short term goals 04/07/2024)   1.  Patient will demonstrate independent use of home exercise program to maintain progress from in clinic treatments. Baseline: See objective data Goal status: On going 03/24/2024  2. PROM right Knee -5* ext to 90* flexion Baseline: See objective data Goal status: Partially met 03/24/2024  LONG TERM GOALS: (target dates for all long term goals  04/28/2024 )   1. Patient will demonstrate/report pain at worst less than or equal to 2/10 to facilitate minimal limitation in daily activity secondary to pain symptoms. Baseline: See objective data Goal status: Initial   2. Patient will demonstrate independent use of home exercise program to facilitate ability to maintain/progress functional gains from skilled physical therapy services. Baseline: See objective data Goal status: Initial  3.  Patient reports Patient-Specific Activity Score improved the average >/= 8 to  indicate improvement in functional activities.  Baseline: SEE OBJECTIVE DATA Goal status: INITIAL   4.  Patient will demonstrate right knee LE MMT 5/5 (within 75% of LLE) throughout to faciltiate usual transfers, stairs, squatting at [facility] for daily life.  Baseline: See objective data Goal status: Initial   5.  PROM right knee -3* ext to 100* flexion Baseline: See objective data Goal status: Initial   6.  AROM right knee standing ext -5* to supine/seated flexion 90* Baseline: See objective data Goal status: Initial   PLAN:  PT FREQUENCY:  3-5x/week for first 2 weeks post Manipulation, then 2x/wk for 4 weeks  PT DURATION: 7 weeks  PLANNED INTERVENTIONS: 97164- PT Re-evaluation, 97750- Physical Performance Testing, 97110-Therapeutic exercises, 97530- Therapeutic activity, 97112- Neuromuscular re-education, 97535- Self Care, 16109- Manual therapy, [phone]- Gait training, [phone]- Electrical stimulation (unattended), [phone]- Electrical stimulation (manual), [id number]- Fluidotherapy, Patient/Family education, Balance training, Stair training, Taping, Dry Needling, Joint mobilization, Scar mobilization, and DME instructions  PLAN FOR NEXT SESSION: AROM emphasis with edema control and quadriceps strengthening   Joli Neas PT, MPT 03/24/24 2:59 PM     Date of referral: 03/08/2024 Referring provider: Casilda Clayman, PA-C  Referring diagnosis? G95.621 (ICD-10-CM) - S/P total knee arthroplasty, right  Treatment diagnosis? (if different than referring diagnosis)  Stiffness of right knee, not elsewhere classified M25.661  Chronic pain of right knee M25.561, G89.29  Muscle weakness (generalized) M62.81  Other abnormalities of gait and mobility R26.89  Localized edema R60.0  What was this (referring dx) caused by? Surgery (Type: TKA)  Nature of Condition: Initial Onset (within last 3 months)   Laterality: Rt  Current Functional Measure Score: Patient Specific Functional Scale  5/10  Objective measurements identify impairments when they are  compared to normal values, the uninvolved extremity, and prior level of function.  [x]  Yes  []  No  Objective assessment of functional ability: Moderate functional limitations   Briefly describe symptoms: right knee range limiting ADLs & gait, pain in right knee  How did symptoms start: surgery  Average pain intensity:  Last 24 hours: 3-10/10  Past week: 3-10/10  How often does the pt experience symptoms? Constantly  How much have the symptoms interfered with usual daily activities? Quite a bit  How has condition changed since care began at this facility? NA - initial visit  In general, how is the patients overall health? Very Good   BACK PAIN (STarT Back Screening Tool) No

## 2024-03-26 ENCOUNTER — Encounter: Payer: Self-pay | Admitting: Orthopedic Surgery

## 2024-03-26 DIAGNOSIS — M24661 Ankylosis, right knee: Secondary | ICD-10-CM

## 2024-03-26 NOTE — Progress Notes (Signed)
 Post-Op Visit Note   Patient: Daniel Hurley           Date of Birth: 1956/04/17           MRN: [id number] Visit Date: 03/22/2024 PCP: Daniel Catholic, MD   Assessment & Plan:  Chief Complaint:  Chief Complaint  Patient presents with   Right Knee - Routine Post Op    RIGHT KNEE MUA (surgery 03-14-24)  Right TKA 01/27/24     Visit Diagnoses: No diagnosis found.  Plan: Daniel Hurley is now a week out manipulation under anesthesia for right total knee replacement placed 01/27/2024.  We did get him to about 120 of flexion.  He has been in physical therapy.  Takes occasional pain medication.  On examination extensor mechanism is intact.  Range of motion is about 10-100.  Taking muscle relaxers as well.  Plan at this time is 2-week return with [facility] for clinical recheck.  I think it is important that he spend at least 3 to 4 hours a day working on range of motion for the knee.  Follow-Up Instructions: No follow-ups on file.   Orders:  No orders of the defined types were placed in this encounter.  No orders of the defined types were placed in this encounter.   Imaging: No results found.  PMFS History: Patient Active Problem List   Diagnosis Date Noted   Arthritis of right knee 01/30/2024   S/P total knee replacement, right 01/27/2024   Unstable angina (HCC)    Chronic neck pain 11/22/2020   Hypertension 02/12/2020   Acute respiratory failure with hypoxia (HCC) 01/20/2016   Chest pain 01/19/2016   COPD exacerbation (HCC) 01/19/2016   Tobacco abuse 01/19/2016   Past Medical History:  Diagnosis Date   COPD (chronic obstructive pulmonary disease) (HCC)    Coronary artery disease    Hypertension 02/12/2020    No family history on file.  Past Surgical History:  Procedure Laterality Date   ANKLE FUSION  2006   BACK SURGERY  2008   L5-5 fusion    CORONARY PRESSURE/FFR STUDY N/A 07/08/2022   Procedure: INTRAVASCULAR PRESSURE WIRE/FFR STUDY;  Surgeon: Odie Benne,  MD;  Location: [facility];  Service: Cardiovascular;  Laterality: N/A;   CORONARY STENT INTERVENTION N/A 07/08/2022   Procedure: CORONARY STENT INTERVENTION;  Surgeon: Odie Benne, MD;  Location: [facility];  Service: Cardiovascular;  Laterality: N/A;   INGUINAL HERNIA REPAIR Right 12/27/2023   Procedure: OPEN RIGHT INGUINAL HERNIA REPAIR WITH MESH;  Surgeon: Enid Harry, MD;  Location: [facility] OR;  Service: General;  Laterality: Right;   KNEE CLOSED REDUCTION Right 03/14/2024   Procedure: CLOSED RIGHT KNEE MANIPULATION;  Surgeon: Jasmine Mesi, MD;  Location: [facility] OR;  Service: Orthopedics;  Laterality: Right;   LEFT HEART CATH AND CORONARY ANGIOGRAPHY N/A 07/08/2022   Procedure: LEFT HEART CATH AND CORONARY ANGIOGRAPHY;  Surgeon: Odie Benne, MD;  Location: [facility];  Service: Cardiovascular;  Laterality: N/A;   left wrist surgery     TOTAL KNEE ARTHROPLASTY Right 01/27/2024   Procedure: RIGHT TOTAL KNEE ARTHROPLASTY;  Surgeon: Jasmine Mesi, MD;  Location: [facility] OR;  Service: Orthopedics;  Laterality: Right;   Social History   Occupational History   Not on file  Tobacco Use   Smoking status: Every Day    Current packs/day: 1.50    Types: Cigarettes   Smokeless tobacco: Never  Vaping Use   Vaping status: Never Used  Substance and Sexual Activity   Alcohol use: Not Currently    Comment: rarely   Drug use: No   Sexual activity: Not Currently

## 2024-03-27 ENCOUNTER — Ambulatory Visit: Admitting: Physical Therapy

## 2024-03-27 ENCOUNTER — Encounter: Payer: Self-pay | Admitting: Physical Therapy

## 2024-03-27 DIAGNOSIS — M6281 Muscle weakness (generalized): Secondary | ICD-10-CM

## 2024-03-27 DIAGNOSIS — M25561 Pain in right knee: Secondary | ICD-10-CM | POA: Diagnosis not present

## 2024-03-27 DIAGNOSIS — G8929 Other chronic pain: Secondary | ICD-10-CM

## 2024-03-27 DIAGNOSIS — M25661 Stiffness of right knee, not elsewhere classified: Secondary | ICD-10-CM

## 2024-03-27 DIAGNOSIS — R2689 Other abnormalities of gait and mobility: Secondary | ICD-10-CM

## 2024-03-27 DIAGNOSIS — R600 Localized edema: Secondary | ICD-10-CM

## 2024-03-27 NOTE — Therapy (Addendum)
 OUTPATIENT PHYSICAL THERAPY TREATMENT NOTE   Patient Name: Daniel Hurley MRN: [id number] DOB:13-Aug-1956, 68 y.o., male Today's Date: 03/27/2024  END OF SESSION:  PT End of Session - 03/27/24 1113     Visit Number 7    Number of Visits 18    Date for PT Re-Evaluation 04/28/24    Authorization Type [employer] Medicare 10% co-insurance    Authorization Time Period 03/15/2024 - 05/17/2024    Authorization - Visit Number 6    Progress Note Due on Visit 10    PT Start Time 1102    PT Stop Time 1152    PT Time Calculation (min) 50 min    Activity Tolerance Patient tolerated treatment well;No increased pain;Patient limited by pain    Behavior During Therapy [facility] for tasks assessed/performed              Past Medical History:  Diagnosis Date   COPD (chronic obstructive pulmonary disease) (HCC)    Coronary artery disease    Hypertension 02/12/2020   Past Surgical History:  Procedure Laterality Date   ANKLE FUSION  2006   BACK SURGERY  2008   L5-5 fusion    CORONARY PRESSURE/FFR STUDY N/A 07/08/2022   Procedure: INTRAVASCULAR PRESSURE WIRE/FFR STUDY;  Surgeon: Odie Benne, MD;  Location: [facility] INVASIVE CV LAB;  Service: Cardiovascular;  Laterality: N/A;   CORONARY STENT INTERVENTION N/A 07/08/2022   Procedure: CORONARY STENT INTERVENTION;  Surgeon: Odie Benne, MD;  Location: [facility] INVASIVE CV LAB;  Service: Cardiovascular;  Laterality: N/A;   INGUINAL HERNIA REPAIR Right 12/27/2023   Procedure: OPEN RIGHT INGUINAL HERNIA REPAIR WITH MESH;  Surgeon: Enid Harry, MD;  Location: [facility] OR;  Service: General;  Laterality: Right;   KNEE CLOSED REDUCTION Right 03/14/2024   Procedure: CLOSED RIGHT KNEE MANIPULATION;  Surgeon: Jasmine Mesi, MD;  Location: [facility] OR;  Service: Orthopedics;  Laterality: Right;   LEFT HEART CATH AND CORONARY ANGIOGRAPHY N/A 07/08/2022   Procedure: LEFT HEART CATH AND CORONARY ANGIOGRAPHY;  Surgeon: Odie Benne, MD;  Location: [facility]  INVASIVE CV LAB;  Service: Cardiovascular;  Laterality: N/A;   left wrist surgery     TOTAL KNEE ARTHROPLASTY Right 01/27/2024   Procedure: RIGHT TOTAL KNEE ARTHROPLASTY;  Surgeon: Jasmine Mesi, MD;  Location: [facility] OR;  Service: Orthopedics;  Laterality: Right;   Patient Active Problem List   Diagnosis Date Noted   Arthrofibrosis of knee joint, right 03/26/2024   Arthritis of right knee 01/30/2024   S/P total knee replacement, right 01/27/2024   Unstable angina (HCC)    Chronic neck pain 11/22/2020   Hypertension 02/12/2020   Acute respiratory failure with hypoxia (HCC) 01/20/2016   Chest pain 01/19/2016   COPD exacerbation (HCC) 01/19/2016   Tobacco abuse 01/19/2016    PCP: Rey Catholic, MD  REFERRING PROVIDER: Casilda Clayman, PA-C   REFERRING DIAG: [phone] (ICD-10-CM) - S/P total knee arthroplasty, right   THERAPY DIAG:  Stiffness of right knee, not elsewhere classified  Chronic pain of right knee  Muscle weakness (generalized)  Other abnormalities of gait and mobility  Localized edema  Rationale for Evaluation and Treatment: Rehabilitation  ONSET DATE: 01/26/24 Right TKA & 03/14/2024 manipulation  SUBJECTIVE:   SUBJECTIVE STATEMENT: Pt reporting 1/10 pain upon arrival. Pt stating worse pain is after prolonged sitting due to stiffness in her Rt knee.    PERTINENT HISTORY: 01/26/24 Right TKA & 03/14/2024 manipulation, COPD, CAD w/stent, HTN, ankle fusion, back sg L5 fusion,  PAIN:    NPRS scale: 1/10 Pain location: Rt knee  Pain description: sore/tight Aggravating factors: end range pains.  Relieving factors: ice   PRECAUTIONS: None  WEIGHT BEARING RESTRICTIONS: No  FALLS:  Has patient fallen in last 6 months? No  LIVING ENVIRONMENT: Lives with: lives with their spouse and adult grandson Lives in: Mobile home Stairs: Yes: External: 5 steps; can reach both Has following equipment at home: Single point cane, Environmental consultant - 2 wheeled, Crutches,  Wheelchair (manual), [employer], bed side commode, Grab bars, and Ramped entry  OCCUPATION: retired Music therapist  PLOF: Independent  PATIENT GOALS:    walk & bend knee  Next MD visit:  OBJECTIVE:  DIAGNOSTIC FINDINGS: 02/09/2024 X-ray AP and lateral views of the knee reviewed.  Total knee arthroplasty  prosthesis in good position and alignment without any complicating features.  There is no evidence of dislocation, periprosthetic fracture, patella   Patient-Specific Activity Scoring Scheme  "0" represents "unable to perform." "10" represents "able to perform at prior level. 0 1 2 3 4 5 6 7 8 9  10 (Date and Score)   Activity Eval  03/15/2024    1. Walking   5    2. Stairs   4    3. Standing ADLs 6   4.    5.    Score 5    Total score = sum of the activity scores/number of activities Minimum detectable change (90%CI) for average score = 2 points Minimum detectable change (90%CI) for single activity score = 3 points  COGNITION: 03/15/2024 Overall cognitive status: WFL    SENSATION: 03/15/2024 WFL  EDEMA:  03/15/2024 Circumferential:   LLE: above knee 38 cm,  around knee 37.3 cm, below knee 34.5 cm RLE: above knee 42 cm,  around knee 42.8 cm, below knee 36.8 cm  POSTURE:  03/15/2024 rounded shoulders, forward head, and weight shift left  PALPATION: 03/15/2024 Right knee: Tenderness along incision especially distally over tibia with some adherence, patella and anterior joint line. Pt denies muscle pain in quad, hamstring or gastroc.   LOWER EXTREMITY ROM:   ROM Right Eval 03/15/2024 Left eval Rt 03/24/2024 Rt 03/27/24  Hip flexion      Hip extension      Hip abduction      Hip adduction      Hip internal rotation      Hip external rotation      Knee flexion Seated  P: 82* A: 85* Supine A: 71* P: 79* A: 85 P: 100 Active 92 AA: 90 A: 82  Knee extension LAQ -22* Standing -18* A: -12 (seated LAQ) P: -7 Active lacks 6  A: -6 (Lacking)  Ankle dorsiflexion       Ankle plantarflexion      Ankle inversion      Ankle eversion       (Blank rows = not tested)  LOWER EXTREMITY MMT:  MMT Right Eval 03/15/2024 Left Eval 03/15/2024  Hip flexion    Hip extension    Hip abduction    Hip adduction    Hip internal rotation    Hip external rotation    Knee flexion    Knee extension HH dynameter 32.1# 30.2# RLE:LLE 59% HH dynameter 52.9# 53.3#  Ankle dorsiflexion    Ankle plantarflexion    Ankle inversion    Ankle eversion     (Blank rows = not tested)  FUNCTIONAL TESTS:  03/15/2024 18 inch chair transfer: requires UE assist and decreased RLE assist moving  from extended position sliding back upon arising.   GAIT: 03/15/2024 Distance walked: 150' Assistive device utilized: Single point cane Level of assistance: SBA / cues for deviations / no balance issues noted Comments: Antalgic pattern with decreased R leg stance duration, right knee flexed in stance with limited increased flexion for swing, RLE abducted                 03/27/24 TherEx Recumbent bike: partial revolutions x 8 minutes Calf stretch x 30 sec on slant board Seated SLR: 2 x 10  TherAct Leg Press bil 81# 3x10 with 1 min flexion holds between sets; RLE only 43# 2x10 Step ups on 6 inch step x 15 leading with Rt LE Mini squats x 15 in parallel bars c bil UE support Manual Seated Rt knee flexion with end range holds; strategies to decrease hip hike needed with contract/relax Modalities Vaso x 10 min med pressure; 34 deg in elevation     TODAY'S TREATMENT 03/24/2024 Recumbent bike Seat 6 for 5 minutes Seated knee flexion with PT overpressure 10 x 10 seconds Supine knee flexion with belt (quad set between reps) 10 x 5 seconds Quadriceps sets with right heel prop 10 x 5 seconds  Functional Activities: Double leg Press 75# 12 reps full extension and stretch into flexion  Single leg Press 37# Right only full extension and stretch into flexion (not ready to increase weight with  single leg) 10 reps    03/22/24 TherEx UBE LEs only seat 10 x 8 min; partial to full revolutions Seated AA Rt knee flexion 10 x 10 sec hold; LLE providing overpressure Seated LAQ 2x10 on Rt; 5#  Heel prop during vaso x 5 min for extension  TherAct Leg Press bil 75# 3x10 with 1 min flexion holds between sets; RLE only 37# 3x10  Manual Seated Rt knee flexion with end range holds; strategies to decrease hip hike needed with contract/relax  Modalities Vaso x 10 min med pressure; 34 deg in elevation   03/20/2024 Therex: UBE LE partial circles to full circles after 3 mins, total 8 mins, seat 11  Incline gastroc stretch 30 sec x 3 bilateral Seated Rt leg LAQ with end range pauses each direction with reciprocal movement opposite leg.  Seated heel prop 3 mins Rt leg Supine quad set for extension 5 sec hold x 10 Rt   Heel prop TKE stretch in supine start of vaso to tolerance.  Education for home use with additional time for education   TherActivity (to improve squatting, transfers, stairs) Leg press in available knee flexion range double leg 75 lbs 2x10 with stretch into flexion, single leg Rt  x 15 37 lbs    Manual: Seated Rt knee flexion distraction c IR mobilization c movement.  Contract/relax knee flexion stretch c percussive device on Rt quad to tolerance.    PATIENT EDUCATION:  03/20/2024 Education details: HEP update Person educated: Patient Education method: Programmer, multimedia, Demonstration, Verbal cues, and Handouts Education comprehension: verbalized understanding, returned demonstration, and verbal cues required  HOME EXERCISE PROGRAM: Access Code: [email or web address] URL: [email or web address] Date: 03/20/2024 Prepared by: Bonna Bustard  Exercises - Supine Heel Slide with Strap  - 2-4 x daily - 7 x weekly - 1-2 sets - 10 reps - 5 seconds hold - supine quad set with towel roll under ankle  - 2-4 x daily - 7 x weekly - 1-2 sets - 10 reps - 5 seconds hold - supine  knee flexion stretch with thigh vertical  - 2-4  x daily - 7 x weekly - 1-2 sets - 10 reps - 5 seconds hold - Seated Knee Extension Stretch with Chair  - 2-4 x daily - 7 x weekly - 1 sets - 1 reps - 3-5 mins hold - Seated Long Arc Quad  - 3-5 x daily - 7 x weekly - 1-2 sets - 10 reps - 2 hold - Seated Quad Set  - 3-5 x daily - 7 x weekly - 1 sets - 10 reps - 5 hold - Seated Knee Flexion AAROM  - 2-3 x daily - 7 x weekly - 1 sets - 5 reps - 10-15 hold  ASSESSMENT: CLINICAL IMPRESSION: Pt tolerating exercises well this visit. Pt with no reports of increased pain during session more stiffness when beginning new tasks. Pt's active assisted arc of motion is -5 to 90 degrees. Continued skilled PT toward goals set.    OBJECTIVE IMPAIRMENTS: Abnormal gait, decreased activity tolerance, decreased balance, decreased knowledge of condition, decreased knowledge of use of DME, decreased mobility, difficulty walking, decreased ROM, decreased strength, increased edema, impaired flexibility, and pain.   ACTIVITY LIMITATIONS: carrying, lifting, sitting, standing, squatting, sleeping, stairs, transfers, and locomotion level  PARTICIPATION LIMITATIONS: laundry, driving, and community activity  PERSONAL FACTORS: Fitness, Time since onset of injury/illness/exacerbation, and 3+ comorbidities: see PMH are also affecting patient's functional outcome.   REHAB POTENTIAL: Good  CLINICAL DECISION MAKING: Stable/uncomplicated  EVALUATION COMPLEXITY: Low   GOALS: Goals reviewed with patient? Yes  SHORT TERM GOALS: (target date for Short term goals 04/07/2024)   1.  Patient will demonstrate independent use of home exercise program to maintain progress from in clinic treatments. Baseline: See objective data Goal status: On going 03/24/2024  2. PROM right Knee -5* ext to 90* flexion Baseline: See objective data Goal status: Partially met 03/27/2024  LONG TERM GOALS: (target dates for all long term goals  04/28/2024  )   1. Patient will demonstrate/report pain at worst less than or equal to 2/10 to facilitate minimal limitation in daily activity secondary to pain symptoms. Baseline: See objective data Goal status: Initial   2. Patient will demonstrate independent use of home exercise program to facilitate ability to maintain/progress functional gains from skilled physical therapy services. Baseline: See objective data Goal status: Initial  3.  Patient reports Patient-Specific Activity Score improved the average >/= 8 to indicate improvement in functional activities.  Baseline: SEE OBJECTIVE DATA Goal status: INITIAL   4.  Patient will demonstrate right knee LE MMT 5/5 (within 75% of LLE) throughout to faciltiate usual transfers, stairs, squatting at [facility] for daily life.  Baseline: See objective data Goal status: Initial   5.  PROM right knee -3* ext to 100* flexion Baseline: See objective data Goal status: Initial   6.  AROM right knee standing ext -5* to supine/seated flexion 90* Baseline: See objective data Goal status: Initial   PLAN:  PT FREQUENCY:  3-5x/week for first 2 weeks post Manipulation, then 2x/wk for 4 weeks  PT DURATION: 7 weeks  PLANNED INTERVENTIONS: 97164- PT Re-evaluation, 97750- Physical Performance Testing, 97110-Therapeutic exercises, 97530- Therapeutic activity, 97112- Neuromuscular re-education, 97535- Self Care, 47829- Manual therapy, [phone]- Gait training, [phone]- Electrical stimulation (unattended), [phone]- Electrical stimulation (manual), [id number]- Fluidotherapy, Patient/Family education, Balance training, Stair training, Taping, Dry Needling, Joint mobilization, Scar mobilization, and DME instructions  PLAN FOR NEXT SESSION: AROM emphasis with edema control and quadriceps strengthening   Jerrel Mor, PT, MPT 03/27/24 11:16 AM   03/27/24 11:16 AM  Date of referral: 03/08/2024 Referring provider: Casilda Clayman, PA-C  Referring diagnosis?  Z61.096 (ICD-10-CM) - S/P total knee arthroplasty, right  Treatment diagnosis? (if different than referring diagnosis)  Stiffness of right knee, not elsewhere classified M25.661  Chronic pain of right knee M25.561, G89.29  Muscle weakness (generalized) M62.81  Other abnormalities of gait and mobility R26.89  Localized edema R60.0  What was this (referring dx) caused by? Surgery (Type: TKA)  Lonne Roan of Condition: Initial Onset (within last 3 months)   Laterality: Rt  Current Functional Measure Score: Patient Specific Functional Scale 5/10  Objective measurements identify impairments when they are compared to normal values, the uninvolved extremity, and prior level of function.  [x]  Yes  []  No  Objective assessment of functional ability: Moderate functional limitations   Briefly describe symptoms: right knee range limiting ADLs & gait, pain in right knee  How did symptoms start: surgery  Average pain intensity:  Last 24 hours: 3-10/10  Past week: 3-10/10  How often does the pt experience symptoms? Constantly  How much have the symptoms interfered with usual daily activities? Quite a bit  How has condition changed since care began at this facility? NA - initial visit  In general, how is the patients overall health? Very Good   BACK PAIN (STarT Back Screening Tool) No

## 2024-03-28 ENCOUNTER — Encounter: Admitting: Physical Therapy

## 2024-03-29 ENCOUNTER — Encounter: Admitting: Physical Therapy

## 2024-03-29 NOTE — Therapy (Incomplete)
 OUTPATIENT PHYSICAL THERAPY TREATMENT NOTE   Patient Name: Daniel Hurley MRN: [id number] DOB:[birth year], 68 y.o., male Today's Date: 03/29/2024  END OF SESSION:     Past Medical History:  Diagnosis Date   COPD (chronic obstructive pulmonary disease) (HCC)    Coronary artery disease    Hypertension 02/12/2020   Past Surgical History:  Procedure Laterality Date   ANKLE FUSION  2006   BACK SURGERY  2008   L5-5 fusion    CORONARY PRESSURE/FFR STUDY N/A 07/08/2022   Procedure: INTRAVASCULAR PRESSURE WIRE/FFR STUDY;  Surgeon: Odie Benne, MD;  Location: [facility];  Service: Cardiovascular;  Laterality: N/A;   CORONARY STENT INTERVENTION N/A 07/08/2022   Procedure: CORONARY STENT INTERVENTION;  Surgeon: Odie Benne, MD;  Location: [facility];  Service: Cardiovascular;  Laterality: N/A;   INGUINAL HERNIA REPAIR Right 12/27/2023   Procedure: OPEN RIGHT INGUINAL HERNIA REPAIR WITH MESH;  Surgeon: Enid Harry, MD;  Location: [facility] OR;  Service: General;  Laterality: Right;   KNEE CLOSED REDUCTION Right 03/14/2024   Procedure: CLOSED RIGHT KNEE MANIPULATION;  Surgeon: Jasmine Mesi, MD;  Location: [facility] OR;  Service: Orthopedics;  Laterality: Right;   LEFT HEART CATH AND CORONARY ANGIOGRAPHY N/A 07/08/2022   Procedure: LEFT HEART CATH AND CORONARY ANGIOGRAPHY;  Surgeon: Odie Benne, MD;  Location: [facility];  Service: Cardiovascular;  Laterality: N/A;   left wrist surgery     TOTAL KNEE ARTHROPLASTY Right 01/27/2024   Procedure: RIGHT TOTAL KNEE ARTHROPLASTY;  Surgeon: Jasmine Mesi, MD;  Location: [facility] OR;  Service: Orthopedics;  Laterality: Right;   Patient Active Problem List   Diagnosis Date Noted   Arthrofibrosis of knee joint, right 03/26/2024   Arthritis of right knee 01/30/2024   S/P total knee replacement, right 01/27/2024   Unstable angina (HCC)    Chronic neck pain 11/22/2020   Hypertension 02/12/2020    Acute respiratory failure with hypoxia (HCC) 01/20/2016   Chest pain 01/19/2016   COPD exacerbation (HCC) 01/19/2016   Tobacco abuse 01/19/2016    PCP: Rey Catholic, MD  REFERRING PROVIDER: Casilda Clayman, PA-C   REFERRING DIAG: [phone] (ICD-10-CM) - S/P total knee arthroplasty, right   THERAPY DIAG:  No diagnosis found.  Rationale for Evaluation and Treatment: Rehabilitation  ONSET DATE: 01/26/24 Right TKA & 03/14/2024 manipulation  SUBJECTIVE:   SUBJECTIVE STATEMENT: ***   Pt reporting 1/10 pain upon arrival. Pt stating worse pain is after prolonged sitting due to stiffness in her Rt knee.   PERTINENT HISTORY: 01/26/24 Right TKA & 03/14/2024 manipulation, COPD, CAD w/stent, HTN, ankle fusion, back sg L5 fusion,   PAIN:  NPRS scale: ***   1/10 Pain location: Rt knee  Pain description: sore/tight Aggravating factors: end range pains.  Relieving factors: ice   PRECAUTIONS: None  WEIGHT BEARING RESTRICTIONS: No  FALLS:  Has patient fallen in last 6 months? No  LIVING ENVIRONMENT: Lives with: lives with their spouse and adult grandson Lives in: Mobile home Stairs: Yes: External: 5 steps; can reach both Has following equipment at home: Single point cane, Environmental consultant - 2 wheeled, Crutches, Wheelchair (manual), [employer], bed side commode, Grab bars, and Ramped entry  OCCUPATION: retired Music therapist  PLOF: Independent  PATIENT GOALS:    walk & bend knee  Next MD visit:  OBJECTIVE:  DIAGNOSTIC FINDINGS: 02/09/2024 X-ray AP and lateral views of the knee reviewed.  Total knee arthroplasty  prosthesis in good position and alignment without any  complicating features.  There is no evidence of dislocation, periprosthetic fracture, patella   Patient-Specific Activity Scoring Scheme  "0" represents "unable to perform." "10" represents "able to perform at prior level. 0 1 2 3 4 5 6 7 8 9  10 (Date and Score)   Activity Eval  03/15/2024    1. Walking   5    2. Stairs   4     3. Standing ADLs 6   4.    5.    Score 5    Total score = sum of the activity scores/number of activities Minimum detectable change (90%CI) for average score = 2 points Minimum detectable change (90%CI) for single activity score = 3 points  COGNITION: 03/15/2024 Overall cognitive status: WFL    SENSATION: 03/15/2024 WFL  EDEMA:  03/15/2024 Circumferential:   LLE: above knee 38 cm,  around knee 37.3 cm, below knee 34.5 cm RLE: above knee 42 cm,  around knee 42.8 cm, below knee 36.8 cm  POSTURE:  03/15/2024 rounded shoulders, forward head, and weight shift left  PALPATION: 03/15/2024 Right knee: Tenderness along incision especially distally over tibia with some adherence, patella and anterior joint line. Pt denies muscle pain in quad, hamstring or gastroc.   LOWER EXTREMITY ROM:   ROM Right Eval 03/15/2024 Left eval Rt 03/24/2024 Rt 03/27/24  Hip flexion      Hip extension      Hip abduction      Hip adduction      Hip internal rotation      Hip external rotation      Knee flexion Seated  P: 82* A: 85* Supine A: 71* P: 79* A: 85 P: 100 Active 92 AA: 90 A: 82  Knee extension LAQ -22* Standing -18* A: -12 (seated LAQ) P: -7 Active lacks 6  A: -6 (Lacking)  Ankle dorsiflexion      Ankle plantarflexion      Ankle inversion      Ankle eversion       (Blank rows = not tested)  LOWER EXTREMITY MMT:  MMT Right Eval 03/15/2024 Left Eval 03/15/2024  Hip flexion    Hip extension    Hip abduction    Hip adduction    Hip internal rotation    Hip external rotation    Knee flexion    Knee extension HH dynameter 32.1# 30.2# RLE:LLE 59% HH dynameter 52.9# 53.3#  Ankle dorsiflexion    Ankle plantarflexion    Ankle inversion    Ankle eversion     (Blank rows = not tested)  FUNCTIONAL TESTS:  03/15/2024 18 inch chair transfer: requires UE assist and decreased RLE assist moving from extended position sliding back upon arising.   GAIT: 03/15/2024 Distance walked:  150' Assistive device utilized: Single point cane Level of assistance: SBA / cues for deviations / no balance issues noted Comments: Antalgic pattern with decreased R leg stance duration, right knee flexed in stance with limited increased flexion for swing, RLE abducted                 TODAY'S  TREATMENT 03/29/2024 Therapeutic Exercise: ***  Therapeutic Activities: ***  Manual:  Modalities: Vaso x 10 min med pressure; 34 deg in elevation    TREATMENT 03/27/24 TherEx Recumbent bike: partial revolutions x 8 minutes Calf stretch x 30 sec on slant board Seated SLR: 2 x 10  TherAct Leg Press bil 81# 3x10 with 1 min flexion holds between sets; RLE only 43# 2x10 Step ups  on 6 inch step x 15 leading with Rt LE Mini squats x 15 in parallel bars c bil UE support Manual Seated Rt knee flexion with end range holds; strategies to decrease hip hike needed with contract/relax Modalities Vaso x 10 min med pressure; 34 deg in elevation     TREATMENT 03/24/2024 Recumbent bike Seat 6 for 5 minutes Seated knee flexion with PT overpressure 10 x 10 seconds Supine knee flexion with belt (quad set between reps) 10 x 5 seconds Quadriceps sets with right heel prop 10 x 5 seconds  Functional Activities: Double leg Press 75# 12 reps full extension and stretch into flexion  Single leg Press 37# Right only full extension and stretch into flexion (not ready to increase weight with single leg) 10 reps    03/22/24 TherEx UBE LEs only seat 10 x 8 min; partial to full revolutions Seated AA Rt knee flexion 10 x 10 sec hold; LLE providing overpressure Seated LAQ 2x10 on Rt; 5#  Heel prop during vaso x 5 min for extension  TherAct Leg Press bil 75# 3x10 with 1 min flexion holds between sets; RLE only 37# 3x10  Manual Seated Rt knee flexion with end range holds; strategies to decrease hip hike needed with contract/relax  Modalities Vaso x 10 min med pressure; 34 deg in elevation    PATIENT  EDUCATION:  03/20/2024 Education details: HEP update Person educated: Patient Education method: Programmer, multimedia, Demonstration, Verbal cues, and Handouts Education comprehension: verbalized understanding, returned demonstration, and verbal cues required  HOME EXERCISE PROGRAM: Access Code: [email or web address] URL: [email or web address] Date: 03/20/2024 Prepared by: Bonna Bustard  Exercises - Supine Heel Slide with Strap  - 2-4 x daily - 7 x weekly - 1-2 sets - 10 reps - 5 seconds hold - supine quad set with towel roll under ankle  - 2-4 x daily - 7 x weekly - 1-2 sets - 10 reps - 5 seconds hold - supine knee flexion stretch with thigh vertical  - 2-4 x daily - 7 x weekly - 1-2 sets - 10 reps - 5 seconds hold - Seated Knee Extension Stretch with Chair  - 2-4 x daily - 7 x weekly - 1 sets - 1 reps - 3-5 mins hold - Seated Long Arc Quad  - 3-5 x daily - 7 x weekly - 1-2 sets - 10 reps - 2 hold - Seated Quad Set  - 3-5 x daily - 7 x weekly - 1 sets - 10 reps - 5 hold - Seated Knee Flexion AAROM  - 2-3 x daily - 7 x weekly - 1 sets - 5 reps - 10-15 hold  ASSESSMENT: CLINICAL IMPRESSION: ***   Pt tolerating exercises well this visit. Pt with no reports of increased pain during session more stiffness when beginning new tasks. Pt's active assisted arc of motion is -5 to 90 degrees. Continued skilled PT toward goals set.   OBJECTIVE IMPAIRMENTS: Abnormal gait, decreased activity tolerance, decreased balance, decreased knowledge of condition, decreased knowledge of use of DME, decreased mobility, difficulty walking, decreased ROM, decreased strength, increased edema, impaired flexibility, and pain.   ACTIVITY LIMITATIONS: carrying, lifting, sitting, standing, squatting, sleeping, stairs, transfers, and locomotion level  PARTICIPATION LIMITATIONS: laundry, driving, and community activity  PERSONAL FACTORS: Fitness, Time since onset of injury/illness/exacerbation, and 3+ comorbidities: see PMH are  also affecting patient's functional outcome.   REHAB POTENTIAL: Good  CLINICAL DECISION MAKING: Stable/uncomplicated  EVALUATION COMPLEXITY: Low   GOALS: Goals reviewed with patient? Yes  SHORT TERM  GOALS: (target date for Short term goals 04/07/2024)   1.  Patient will demonstrate independent use of home exercise program to maintain progress from in clinic treatments. Baseline: See objective data Goal status: Ongoing   03/29/2024  2. PROM right Knee -5* ext to 90* flexion Baseline: See objective data Goal status: Partially met 03/27/2024  LONG TERM GOALS: (target dates for all long term goals  04/28/2024 )   1. Patient will demonstrate/report pain at worst less than or equal to 2/10 to facilitate minimal limitation in daily activity secondary to pain symptoms. Baseline: See objective data Goal status: Ongoing   03/29/2024   2. Patient will demonstrate independent use of home exercise program to facilitate ability to maintain/progress functional gains from skilled physical therapy services. Baseline: See objective data Goal status: Ongoing   03/29/2024  3.  Patient reports Patient-Specific Activity Score improved the average >/= 8 to indicate improvement in functional activities.  Baseline: SEE OBJECTIVE DATA Goal status: Ongoing   03/29/2024   4.  Patient will demonstrate right knee LE MMT 5/5 (within 75% of LLE) throughout to faciltiate usual transfers, stairs, squatting at [facility] for daily life.  Baseline: See objective data Goal status: Ongoing   03/29/2024   5.  PROM right knee -3* ext to 100* flexion Baseline: See objective data Goal status: Ongoing   03/29/2024   6.  AROM right knee standing ext -5* to supine/seated flexion 90* Baseline: See objective data Goal status: Ongoing   03/29/2024   PLAN:  PT FREQUENCY:  3-5x/week for first 2 weeks post Manipulation, then 2x/wk for 4 weeks  PT DURATION: 7 weeks  PLANNED INTERVENTIONS: 97164- PT Re-evaluation, 97750- Physical  Performance Testing, 97110-Therapeutic exercises, 97530- Therapeutic activity, 97112- Neuromuscular re-education, 97535- Self Care, 16109- Manual therapy, [phone]- Gait training, [phone]- Electrical stimulation (unattended), [phone]- Electrical stimulation (manual), [id number]- Fluidotherapy, Patient/Family education, Balance training, Stair training, Taping, Dry Needling, Joint mobilization, Scar mobilization, and DME instructions  PLAN FOR NEXT SESSION: ***  AROM emphasis with edema control and quadriceps strengthening    Lorie Rook, PT, DPT 03/29/2024, 7:34 AM      Date of referral: 03/08/2024 Referring provider: Casilda Clayman, PA-C  Referring diagnosis? G95.621 (ICD-10-CM) - S/P total knee arthroplasty, right  Treatment diagnosis? (if different than referring diagnosis)  Stiffness of right knee, not elsewhere classified M25.661  Chronic pain of right knee M25.561, G89.29  Muscle weakness (generalized) M62.81  Other abnormalities of gait and mobility R26.89  Localized edema R60.0  What was this (referring dx) caused by? Surgery (Type: TKA)  Lonne Roan of Condition: Initial Onset (within last 3 months)   Laterality: Rt  Current Functional Measure Score: Patient Specific Functional Scale 5/10  Objective measurements identify impairments when they are compared to normal values, the uninvolved extremity, and prior level of function.  [x]  Yes  []  No  Objective assessment of functional ability: Moderate functional limitations   Briefly describe symptoms: right knee range limiting ADLs & gait, pain in right knee  How did symptoms start: surgery  Average pain intensity:  Last 24 hours: 3-10/10  Past week: 3-10/10  How often does the pt experience symptoms? Constantly  How much have the symptoms interfered with usual daily activities? Quite a bit  How has condition changed since care began at this facility? NA - initial visit  In general, how is the patients overall health?  Very Good   BACK PAIN (STarT Back Screening Tool) No

## 2024-03-30 ENCOUNTER — Encounter: Admitting: Rehabilitative and Restorative Service Providers"

## 2024-03-31 ENCOUNTER — Ambulatory Visit (INDEPENDENT_AMBULATORY_CARE_PROVIDER_SITE_OTHER): Admitting: Physical Therapy

## 2024-03-31 DIAGNOSIS — M25661 Stiffness of right knee, not elsewhere classified: Secondary | ICD-10-CM

## 2024-03-31 DIAGNOSIS — M6281 Muscle weakness (generalized): Secondary | ICD-10-CM | POA: Diagnosis not present

## 2024-03-31 DIAGNOSIS — M25561 Pain in right knee: Secondary | ICD-10-CM | POA: Diagnosis not present

## 2024-03-31 DIAGNOSIS — G8929 Other chronic pain: Secondary | ICD-10-CM

## 2024-03-31 DIAGNOSIS — R2689 Other abnormalities of gait and mobility: Secondary | ICD-10-CM | POA: Diagnosis not present

## 2024-03-31 DIAGNOSIS — R600 Localized edema: Secondary | ICD-10-CM

## 2024-03-31 NOTE — Therapy (Signed)
 OUTPATIENT PHYSICAL THERAPY TREATMENT NOTE   Patient Name: Daniel Hurley MRN: [id number] DOB:[birth year], 68 y.o., male Today's Date: 03/31/2024  END OF SESSION:  PT End of Session - 03/31/24 0928     Visit Number 8    Number of Visits 18    Date for PT Re-Evaluation 04/28/24    Authorization Type [employer] Medicare 10% co-insurance    Authorization Time Period 03/15/2024 - 05/17/2024    Progress Note Due on Visit 10    PT Start Time 0930    PT Stop Time 1023    PT Time Calculation (min) 53 min    Activity Tolerance Patient tolerated treatment well;No increased pain;Patient limited by pain    Behavior During Therapy [facility] for tasks assessed/performed             Past Medical History:  Diagnosis Date   COPD (chronic obstructive pulmonary disease) (HCC)    Coronary artery disease    Hypertension 02/12/2020   Past Surgical History:  Procedure Laterality Date   ANKLE FUSION  2006   BACK SURGERY  2008   L5-5 fusion    CORONARY PRESSURE/FFR STUDY N/A 07/08/2022   Procedure: INTRAVASCULAR PRESSURE WIRE/FFR STUDY;  Surgeon: Odie Benne, MD;  Location: [facility];  Service: Cardiovascular;  Laterality: N/A;   CORONARY STENT INTERVENTION N/A 07/08/2022   Procedure: CORONARY STENT INTERVENTION;  Surgeon: Odie Benne, MD;  Location: [facility];  Service: Cardiovascular;  Laterality: N/A;   INGUINAL HERNIA REPAIR Right 12/27/2023   Procedure: OPEN RIGHT INGUINAL HERNIA REPAIR WITH MESH;  Surgeon: Enid Harry, MD;  Location: [facility] OR;  Service: General;  Laterality: Right;   KNEE CLOSED REDUCTION Right 03/14/2024   Procedure: CLOSED RIGHT KNEE MANIPULATION;  Surgeon: Jasmine Mesi, MD;  Location: [facility] OR;  Service: Orthopedics;  Laterality: Right;   LEFT HEART CATH AND CORONARY ANGIOGRAPHY N/A 07/08/2022   Procedure: LEFT HEART CATH AND CORONARY ANGIOGRAPHY;  Surgeon: Odie Benne, MD;  Location: [facility];  Service:  Cardiovascular;  Laterality: N/A;   left wrist surgery     TOTAL KNEE ARTHROPLASTY Right 01/27/2024   Procedure: RIGHT TOTAL KNEE ARTHROPLASTY;  Surgeon: Jasmine Mesi, MD;  Location: [facility] OR;  Service: Orthopedics;  Laterality: Right;   Patient Active Problem List   Diagnosis Date Noted   Arthrofibrosis of knee joint, right 03/26/2024   Arthritis of right knee 01/30/2024   S/P total knee replacement, right 01/27/2024   Unstable angina (HCC)    Chronic neck pain 11/22/2020   Hypertension 02/12/2020   Acute respiratory failure with hypoxia (HCC) 01/20/2016   Chest pain 01/19/2016   COPD exacerbation (HCC) 01/19/2016   Tobacco abuse 01/19/2016    PCP: Rey Catholic, MD  REFERRING PROVIDER: Casilda Clayman, PA-C   REFERRING DIAG: [phone] (ICD-10-CM) - S/P total knee arthroplasty, right   THERAPY DIAG:  Stiffness of right knee, not elsewhere classified  Chronic pain of right knee  Muscle weakness (generalized)  Other abnormalities of gait and mobility  Localized edema  Rationale for Evaluation and Treatment: Rehabilitation  ONSET DATE: 01/26/24 Right TKA & 03/14/2024 manipulation  SUBJECTIVE:   SUBJECTIVE STATEMENT: Pt states pain has been okay. "I just can't bend the darn thing." Hasn't really been using his cane. Has been cleared to play golf. Felt pretty good doing all 18 holes.   PERTINENT HISTORY: 01/26/24 Right TKA & 03/14/2024 manipulation, COPD, CAD w/stent, HTN, ankle fusion, back sg L5 fusion,  PAIN:  NPRS scale: 1/10 Pain location: Rt knee  Pain description: sore/tight Aggravating factors: end range pains.  Relieving factors: ice   PRECAUTIONS: None  WEIGHT BEARING RESTRICTIONS: No  FALLS:  Has patient fallen in last 6 months? No  LIVING ENVIRONMENT: Lives with: lives with their spouse and adult grandson Lives in: Mobile home Stairs: Yes: External: 5 steps; can reach both Has following equipment at home: Single point cane, Environmental consultant - 2  wheeled, Crutches, Wheelchair (manual), [employer], bed side commode, Grab bars, and Ramped entry  OCCUPATION: retired Music therapist  PLOF: Independent  PATIENT GOALS:    walk & bend knee  Next MD visit:  OBJECTIVE:  DIAGNOSTIC FINDINGS: 02/09/2024 X-ray AP and lateral views of the knee reviewed.  Total knee arthroplasty  prosthesis in good position and alignment without any complicating features.  There is no evidence of dislocation, periprosthetic fracture, patella   Patient-Specific Activity Scoring Scheme  "0" represents "unable to perform." "10" represents "able to perform at prior level. 0 1 2 3 4 5 6 7 8 9  10 (Date and Score)   Activity Eval  03/15/2024    1. Walking   5    2. Stairs   4    3. Standing ADLs 6   4.    5.    Score 5    Total score = sum of the activity scores/number of activities Minimum detectable change (90%CI) for average score = 2 points Minimum detectable change (90%CI) for single activity score = 3 points  COGNITION: 03/15/2024 Overall cognitive status: WFL    SENSATION: 03/15/2024 WFL  EDEMA:  03/15/2024 Circumferential:   LLE: above knee 38 cm,  around knee 37.3 cm, below knee 34.5 cm RLE: above knee 42 cm,  around knee 42.8 cm, below knee 36.8 cm  POSTURE:  03/15/2024 rounded shoulders, forward head, and weight shift left  PALPATION: 03/15/2024 Right knee: Tenderness along incision especially distally over tibia with some adherence, patella and anterior joint line. Pt denies muscle pain in quad, hamstring or gastroc.   LOWER EXTREMITY ROM:   ROM Right Eval 03/15/2024 Left eval Rt 03/24/2024 Rt 03/27/24  Hip flexion      Hip extension      Hip abduction      Hip adduction      Hip internal rotation      Hip external rotation      Knee flexion Seated  P: 82* A: 85* Supine A: 71* P: 79* A: 85 P: 100 Active 92 AA: 90 A: 82  Knee extension LAQ -22* Standing -18* A: -12 (seated LAQ) P: -7 Active lacks 6  A: -6 (Lacking)  Ankle  dorsiflexion      Ankle plantarflexion      Ankle inversion      Ankle eversion       (Blank rows = not tested)  LOWER EXTREMITY MMT:  MMT Right Eval 03/15/2024 Left Eval 03/15/2024  Hip flexion    Hip extension    Hip abduction    Hip adduction    Hip internal rotation    Hip external rotation    Knee flexion    Knee extension HH dynameter 32.1# 30.2# RLE:LLE 59% HH dynameter 52.9# 53.3#  Ankle dorsiflexion    Ankle plantarflexion    Ankle inversion    Ankle eversion     (Blank rows = not tested)  FUNCTIONAL TESTS:  03/15/2024 18 inch chair transfer: requires UE assist and decreased RLE assist moving from extended  position sliding back upon arising.   GAIT: 03/15/2024 Distance walked: 150' Assistive device utilized: Single point cane Level of assistance: SBA / cues for deviations / no balance issues noted Comments: Antalgic pattern with decreased R leg stance duration, right knee flexed in stance with limited increased flexion for swing, RLE abducted                 TODAY'S  TREATMENT 03/29/2024 Therapeutic Exercise: Sci fit L1; 3 min fwd, 3 min bwd full revolutions at seat 9; 1 min seat 8 fwd; 1 min seat 7 fwd Supine knee flexion stretch against gravity x1 min; with hamstring curl 2x10  Therapeutic Activities: Quad set + overpressure 2x10 SLR 2x10 Shuttle double leg press 87# 2x10 Shuttle single leg press 43# 2x10, 50# x10  Manual: STM & MFR hamstring and quads Grade II to III patellar mobilizations distal and proximally for knee flex/ext Quadruped rocking fwd/bwd for knee bend with PT MWM Grade II to III tibiofemoral mobs for flexion and ext  Modalities: Vaso x 10 min med pressure; 34 deg in elevation    TREATMENT 03/27/24 TherEx Recumbent bike: partial revolutions x 8 minutes Calf stretch x 30 sec on slant board Seated SLR: 2 x 10  TherAct Leg Press bil 81# 3x10 with 1 min flexion holds between sets; RLE only 43# 2x10 Step ups on 6 inch step x 15  leading with Rt LE Mini squats x 15 in parallel bars c bil UE support Manual Seated Rt knee flexion with end range holds; strategies to decrease hip hike needed with contract/relax Modalities Vaso x 10 min med pressure; 34 deg in elevation     TREATMENT 03/24/2024 Recumbent bike Seat 6 for 5 minutes Seated knee flexion with PT overpressure 10 x 10 seconds Supine knee flexion with belt (quad set between reps) 10 x 5 seconds Quadriceps sets with right heel prop 10 x 5 seconds  Functional Activities: Double leg Press 75# 12 reps full extension and stretch into flexion  Single leg Press 37# Right only full extension and stretch into flexion (not ready to increase weight with single leg) 10 reps    03/22/24 TherEx UBE LEs only seat 10 x 8 min; partial to full revolutions Seated AA Rt knee flexion 10 x 10 sec hold; LLE providing overpressure Seated LAQ 2x10 on Rt; 5#  Heel prop during vaso x 5 min for extension  TherAct Leg Press bil 75# 3x10 with 1 min flexion holds between sets; RLE only 37# 3x10  Manual Seated Rt knee flexion with end range holds; strategies to decrease hip hike needed with contract/relax  Modalities Vaso x 10 min med pressure; 34 deg in elevation    PATIENT EDUCATION:  03/20/2024 Education details: HEP update Person educated: Patient Education method: Programmer, multimedia, Demonstration, Verbal cues, and Handouts Education comprehension: verbalized understanding, returned demonstration, and verbal cues required  HOME EXERCISE PROGRAM: Access Code: [email or web address] URL: [email or web address] Date: 03/20/2024 Prepared by: Bonna Bustard  Exercises - Supine Heel Slide with Strap  - 2-4 x daily - 7 x weekly - 1-2 sets - 10 reps - 5 seconds hold - supine quad set with towel roll under ankle  - 2-4 x daily - 7 x weekly - 1-2 sets - 10 reps - 5 seconds hold - supine knee flexion stretch with thigh vertical  - 2-4 x daily - 7 x weekly - 1-2 sets - 10 reps - 5  seconds hold - Seated Knee Extension Stretch with Chair  - 2-4  x daily - 7 x weekly - 1 sets - 1 reps - 3-5 mins hold - Seated Long Arc Quad  - 3-5 x daily - 7 x weekly - 1-2 sets - 10 reps - 2 hold - Seated Quad Set  - 3-5 x daily - 7 x weekly - 1 sets - 10 reps - 5 hold - Seated Knee Flexion AAROM  - 2-3 x daily - 7 x weekly - 1 sets - 5 reps - 10-15 hold  ASSESSMENT: CLINICAL IMPRESSION: Pt most limited with knee flexion. Provided heavy manual work to try and large focus on knee mobility this session. Continued to progress strengthening on leg press.   OBJECTIVE IMPAIRMENTS: Abnormal gait, decreased activity tolerance, decreased balance, decreased knowledge of condition, decreased knowledge of use of DME, decreased mobility, difficulty walking, decreased ROM, decreased strength, increased edema, impaired flexibility, and pain.   ACTIVITY LIMITATIONS: carrying, lifting, sitting, standing, squatting, sleeping, stairs, transfers, and locomotion level  PARTICIPATION LIMITATIONS: laundry, driving, and community activity  PERSONAL FACTORS: Fitness, Time since onset of injury/illness/exacerbation, and 3+ comorbidities: see PMH are also affecting patient's functional outcome.   REHAB POTENTIAL: Good  CLINICAL DECISION MAKING: Stable/uncomplicated  EVALUATION COMPLEXITY: Low   GOALS: Goals reviewed with patient? Yes  SHORT TERM GOALS: (target date for Short term goals 04/07/2024)   1.  Patient will demonstrate independent use of home exercise program to maintain progress from in clinic treatments. Baseline: See objective data Goal status: Ongoing   03/29/2024  2. PROM right Knee -5* ext to 90* flexion Baseline: See objective data Goal status: Partially met 03/27/2024  LONG TERM GOALS: (target dates for all long term goals  04/28/2024 )   1. Patient will demonstrate/report pain at worst less than or equal to 2/10 to facilitate minimal limitation in daily activity secondary to pain  symptoms. Baseline: See objective data Goal status: Ongoing   03/29/2024   2. Patient will demonstrate independent use of home exercise program to facilitate ability to maintain/progress functional gains from skilled physical therapy services. Baseline: See objective data Goal status: Ongoing   03/29/2024  3.  Patient reports Patient-Specific Activity Score improved the average >/= 8 to indicate improvement in functional activities.  Baseline: SEE OBJECTIVE DATA Goal status: Ongoing   03/29/2024   4.  Patient will demonstrate right knee LE MMT 5/5 (within 75% of LLE) throughout to faciltiate usual transfers, stairs, squatting at [facility] for daily life.  Baseline: See objective data Goal status: Ongoing   03/29/2024   5.  PROM right knee -3* ext to 100* flexion Baseline: See objective data Goal status: Ongoing   03/29/2024   6.  AROM right knee standing ext -5* to supine/seated flexion 90* Baseline: See objective data Goal status: Ongoing   03/29/2024   PLAN:  PT FREQUENCY:  3-5x/week for first 2 weeks post Manipulation, then 2x/wk for 4 weeks  PT DURATION: 7 weeks  PLANNED INTERVENTIONS: 97164- PT Re-evaluation, 97750- Physical Performance Testing, 97110-Therapeutic exercises, 97530- Therapeutic activity, 97112- Neuromuscular re-education, 97535- Self Care, 16109- Manual therapy, [phone]- Gait training, [phone]- Electrical stimulation (unattended), [phone]- Electrical stimulation (manual), [id number]- Fluidotherapy, Patient/Family education, Balance training, Stair training, Taping, Dry Needling, Joint mobilization, Scar mobilization, and DME instructions  PLAN FOR NEXT SESSION:  AROM emphasis with edema control and quadriceps strengthening    Toia Micale April Lanney Pitts, PT, DPT 03/31/2024, 10:15 AM      Date of referral: 03/08/2024 Referring provider: Casilda Clayman, PA-C  Referring diagnosis? G95.621 (ICD-10-CM) -  S/P total knee arthroplasty, right  Treatment diagnosis? (if different  than referring diagnosis)  Stiffness of right knee, not elsewhere classified M25.661  Chronic pain of right knee M25.561, G89.29  Muscle weakness (generalized) M62.81  Other abnormalities of gait and mobility R26.89  Localized edema R60.0  What was this (referring dx) caused by? Surgery (Type: TKA)  Lonne Roan of Condition: Initial Onset (within last 3 months)   Laterality: Rt  Current Functional Measure Score: Patient Specific Functional Scale 5/10  Objective measurements identify impairments when they are compared to normal values, the uninvolved extremity, and prior level of function.  [x]  Yes  []  No  Objective assessment of functional ability: Moderate functional limitations   Briefly describe symptoms: right knee range limiting ADLs & gait, pain in right knee  How did symptoms start: surgery  Average pain intensity:  Last 24 hours: 3-10/10  Past week: 3-10/10  How often does the pt experience symptoms? Constantly  How much have the symptoms interfered with usual daily activities? Quite a bit  How has condition changed since care began at this facility? NA - initial visit  In general, how is the patients overall health? Very Good   BACK PAIN (STarT Back Screening Tool) No

## 2024-04-05 ENCOUNTER — Ambulatory Visit (INDEPENDENT_AMBULATORY_CARE_PROVIDER_SITE_OTHER): Admitting: Orthopedic Surgery

## 2024-04-05 ENCOUNTER — Ambulatory Visit (INDEPENDENT_AMBULATORY_CARE_PROVIDER_SITE_OTHER): Admitting: Physical Therapy

## 2024-04-05 ENCOUNTER — Encounter: Payer: Self-pay | Admitting: Physical Therapy

## 2024-04-05 DIAGNOSIS — M25661 Stiffness of right knee, not elsewhere classified: Secondary | ICD-10-CM | POA: Diagnosis not present

## 2024-04-05 DIAGNOSIS — R600 Localized edema: Secondary | ICD-10-CM

## 2024-04-05 DIAGNOSIS — M25561 Pain in right knee: Secondary | ICD-10-CM | POA: Diagnosis not present

## 2024-04-05 DIAGNOSIS — M6281 Muscle weakness (generalized): Secondary | ICD-10-CM

## 2024-04-05 DIAGNOSIS — Z96651 Presence of right artificial knee joint: Secondary | ICD-10-CM

## 2024-04-05 DIAGNOSIS — R2689 Other abnormalities of gait and mobility: Secondary | ICD-10-CM | POA: Diagnosis not present

## 2024-04-05 DIAGNOSIS — G8929 Other chronic pain: Secondary | ICD-10-CM

## 2024-04-05 NOTE — Therapy (Signed)
 OUTPATIENT PHYSICAL THERAPY TREATMENT NOTE   Patient Name: Daniel Hurley MRN: [id number] DOB:[birth year], 68 y.o., male Today's Date: 04/05/2024  END OF SESSION:  PT End of Session - 04/05/24 1257     Visit Number 9    Number of Visits 18    Date for PT Re-Evaluation 04/28/24    Authorization Type [employer] Medicare 10% co-insurance    Authorization Time Period 03/15/2024 - 05/17/2024    Authorization - Visit Number 9    Authorization - Number of Visits 18    Progress Note Due on Visit 10    PT Start Time 1258    PT Stop Time 1343    PT Time Calculation (min) 45 min    Activity Tolerance Patient tolerated treatment well;No increased pain;Patient limited by pain    Behavior During Therapy [facility] for tasks assessed/performed              Past Medical History:  Diagnosis Date   COPD (chronic obstructive pulmonary disease) (HCC)    Coronary artery disease    Hypertension 02/12/2020   Past Surgical History:  Procedure Laterality Date   ANKLE FUSION  2006   BACK SURGERY  2008   L5-5 fusion    CORONARY PRESSURE/FFR STUDY N/A 07/08/2022   Procedure: INTRAVASCULAR PRESSURE WIRE/FFR STUDY;  Surgeon: Odie Benne, MD;  Location: [facility];  Service: Cardiovascular;  Laterality: N/A;   CORONARY STENT INTERVENTION N/A 07/08/2022   Procedure: CORONARY STENT INTERVENTION;  Surgeon: Odie Benne, MD;  Location: [facility];  Service: Cardiovascular;  Laterality: N/A;   INGUINAL HERNIA REPAIR Right 12/27/2023   Procedure: OPEN RIGHT INGUINAL HERNIA REPAIR WITH MESH;  Surgeon: Enid Harry, MD;  Location: [facility] OR;  Service: General;  Laterality: Right;   KNEE CLOSED REDUCTION Right 03/14/2024   Procedure: CLOSED RIGHT KNEE MANIPULATION;  Surgeon: Jasmine Mesi, MD;  Location: [facility] OR;  Service: Orthopedics;  Laterality: Right;   LEFT HEART CATH AND CORONARY ANGIOGRAPHY N/A 07/08/2022   Procedure: LEFT HEART CATH AND CORONARY ANGIOGRAPHY;  Surgeon:  Odie Benne, MD;  Location: [facility];  Service: Cardiovascular;  Laterality: N/A;   left wrist surgery     TOTAL KNEE ARTHROPLASTY Right 01/27/2024   Procedure: RIGHT TOTAL KNEE ARTHROPLASTY;  Surgeon: Jasmine Mesi, MD;  Location: [facility] OR;  Service: Orthopedics;  Laterality: Right;   Patient Active Problem List   Diagnosis Date Noted   Arthrofibrosis of knee joint, right 03/26/2024   Arthritis of right knee 01/30/2024   S/P total knee replacement, right 01/27/2024   Unstable angina (HCC)    Chronic neck pain 11/22/2020   Hypertension 02/12/2020   Acute respiratory failure with hypoxia (HCC) 01/20/2016   Chest pain 01/19/2016   COPD exacerbation (HCC) 01/19/2016   Tobacco abuse 01/19/2016    PCP: Rey Catholic, MD  REFERRING PROVIDER: Casilda Clayman, PA-C   REFERRING DIAG: [phone] (ICD-10-CM) - S/P total knee arthroplasty, right   THERAPY DIAG:  Stiffness of right knee, not elsewhere classified  Chronic pain of right knee  Muscle weakness (generalized)  Other abnormalities of gait and mobility  Localized edema  Rationale for Evaluation and Treatment: Rehabilitation  ONSET DATE: 01/26/24 Right TKA & 03/14/2024 manipulation  SUBJECTIVE:   SUBJECTIVE STATEMENT: His leg felt better after doing leg press at higher weight last time.   PERTINENT HISTORY: 01/26/24 Right TKA & 03/14/2024 manipulation, COPD, CAD w/stent, HTN, ankle fusion, back sg L5 fusion,  PAIN:  NPRS scale:  at rest 0/10 and trying to flex   1/10 Pain location: Rt knee  Pain description: sore/tight Aggravating factors: end range pains.  Relieving factors: ice   PRECAUTIONS: None  WEIGHT BEARING RESTRICTIONS: No  FALLS:  Has patient fallen in last 6 months? No  LIVING ENVIRONMENT: Lives with: lives with their spouse and adult grandson Lives in: Mobile home Stairs: Yes: External: 5 steps; can reach both Has following equipment at home: Single point cane, Environmental consultant - 2  wheeled, Crutches, Wheelchair (manual), [employer], bed side commode, Grab bars, and Ramped entry  OCCUPATION: retired Music therapist  PLOF: Independent  PATIENT GOALS:    walk & bend knee  Next MD visit:  OBJECTIVE:  DIAGNOSTIC FINDINGS: 02/09/2024 X-ray AP and lateral views of the knee reviewed.  Total knee arthroplasty  prosthesis in good position and alignment without any complicating features.  There is no evidence of dislocation, periprosthetic fracture, patella   Patient-Specific Activity Scoring Scheme  "0" represents "unable to perform." "10" represents "able to perform at prior level. 0 1 2 3 4 5 6 7 8 9  10 (Date and Score)   Activity Eval  03/15/2024    1. Walking   5    2. Stairs   4    3. Standing ADLs 6   4.    5.    Score 5    Total score = sum of the activity scores/number of activities Minimum detectable change (90%CI) for average score = 2 points Minimum detectable change (90%CI) for single activity score = 3 points  COGNITION: 03/15/2024 Overall cognitive status: WFL    SENSATION: 03/15/2024 WFL  EDEMA:  03/15/2024 Circumferential:   LLE: above knee 38 cm,  around knee 37.3 cm, below knee 34.5 cm RLE: above knee 42 cm,  around knee 42.8 cm, below knee 36.8 cm  POSTURE:  03/15/2024 rounded shoulders, forward head, and weight shift left  PALPATION: 03/15/2024 Right knee: Tenderness along incision especially distally over tibia with some adherence, patella and anterior joint line. Pt denies muscle pain in quad, hamstring or gastroc.   LOWER EXTREMITY ROM:   ROM Right Eval 03/15/2024 Left eval Rt 03/24/2024 Rt 03/27/24 Right 04/05/24  Hip flexion       Hip extension       Hip abduction       Hip adduction       Hip internal rotation       Hip external rotation       Knee flexion Seated  P: 82* A: 85* Supine A: 71* P: 79* A: 85 P: 100 Active 92 AA: 90 A: 82 Seated A: 90*  Knee extension LAQ -22* Standing -18* A: -12 (seated LAQ) P: -7 Active  lacks 6  A: -6 (Lacking) Standing TKE -7* LAQ -9*  Ankle dorsiflexion       Ankle plantarflexion       Ankle inversion       Ankle eversion        (Blank rows = not tested)  LOWER EXTREMITY MMT:  MMT Right Eval 03/15/2024 Left Eval 03/15/2024  Hip flexion    Hip extension    Hip abduction    Hip adduction    Hip internal rotation    Hip external rotation    Knee flexion    Knee extension HH dynameter 32.1# 30.2# RLE:LLE 59% HH dynameter 52.9# 53.3#  Ankle dorsiflexion    Ankle plantarflexion    Ankle inversion  Ankle eversion     (Blank rows = not tested)  FUNCTIONAL TESTS:  03/15/2024 18 inch chair transfer: requires UE assist and decreased RLE assist moving from extended position sliding back upon arising.   GAIT: 03/15/2024 Distance walked: 150' Assistive device utilized: Single point cane Level of assistance: SBA / cues for deviations / no balance issues noted Comments: Antalgic pattern with decreased R leg stance duration, right knee flexed in stance with limited increased flexion for swing, RLE abducted                 TODAY'S  TREATMENT 04/05/2024 Therapeutic Exercise:  Precor recumbent seat 5 rocking flexion stretch 8 min.  Standing right knee flexion stretch with foot in chair against wall 10 sec hold 3 reps 3 sets moving stance LLE 1-2" closer to chair with each set. Standing quad stretch with towel assisted knee flexion 30 sec hold 3 reps Standing gastroc stretch incline board BLEs 30 sec hold 3 reps Squat at sink with chair behind for safety with PT demo & verbal cues on technique. 10 reps. Knee ext with plank position with BUE on counter marching using stance LE push off so contralateral knee touching counter 10 reps.   Therapeutic Activities: Shuttle double leg press 100# 2x10 focus on both ext & flex Shuttle single leg press 43# 2x10, 50# x10   TREATMENT 03/29/2024 Therapeutic Exercise: Sci fit L1; 3 min fwd, 3 min bwd full revolutions at seat 9;  1 min seat 8 fwd; 1 min seat 7 fwd Supine knee flexion stretch against gravity x1 min; with hamstring curl 2x10  Therapeutic Activities: Quad set + overpressure 2x10 SLR 2x10 Shuttle double leg press 87# 2x10 Shuttle single leg press 43# 2x10, 50# x10  Manual: STM & MFR hamstring and quads Grade II to III patellar mobilizations distal and proximally for knee flex/ext Quadruped rocking fwd/bwd for knee bend with PT MWM Grade II to III tibiofemoral mobs for flexion and ext  Modalities: Vaso x 10 min med pressure; 34 deg in elevation    TREATMENT 03/27/24 TherEx Recumbent bike: partial revolutions x 8 minutes Calf stretch x 30 sec on slant board Seated SLR: 2 x 10  TherAct Leg Press bil 81# 3x10 with 1 min flexion holds between sets; RLE only 43# 2x10 Step ups on 6 inch step x 15 leading with Rt LE Mini squats x 15 in parallel bars c bil UE support Manual Seated Rt knee flexion with end range holds; strategies to decrease hip hike needed with contract/relax Modalities Vaso x 10 min med pressure; 34 deg in elevation     TREATMENT 03/24/2024 Recumbent bike Seat 6 for 5 minutes Seated knee flexion with PT overpressure 10 x 10 seconds Supine knee flexion with belt (quad set between reps) 10 x 5 seconds Quadriceps sets with right heel prop 10 x 5 seconds  Functional Activities: Double leg Press 75# 12 reps full extension and stretch into flexion  Single leg Press 37# Right only full extension and stretch into flexion (not ready to increase weight with single leg) 10 reps    PATIENT EDUCATION:  03/20/2024 Education details: HEP update Person educated: Patient Education method: Programmer, multimedia, Demonstration, Verbal cues, and Handouts Education comprehension: verbalized understanding, returned demonstration, and verbal cues required  HOME EXERCISE PROGRAM: Access Code: [email or web address] URL: [email or web address] Date: 03/20/2024 Prepared by: Bonna Bustard  Exercises - Supine Heel Slide with Strap  - 2-4 x daily - 7 x weekly - 1-2 sets - 10 reps -  5 seconds hold - supine quad set with towel roll under ankle  - 2-4 x daily - 7 x weekly - 1-2 sets - 10 reps - 5 seconds hold - supine knee flexion stretch with thigh vertical  - 2-4 x daily - 7 x weekly - 1-2 sets - 10 reps - 5 seconds hold - Seated Knee Extension Stretch with Chair  - 2-4 x daily - 7 x weekly - 1 sets - 1 reps - 3-5 mins hold - Seated Long Arc Quad  - 3-5 x daily - 7 x weekly - 1-2 sets - 10 reps - 2 hold - Seated Quad Set  - 3-5 x daily - 7 x weekly - 1 sets - 10 reps - 5 hold - Seated Knee Flexion AAROM  - 2-3 x daily - 7 x weekly - 1 sets - 5 reps - 10-15 hold  ASSESSMENT: CLINICAL IMPRESSION: Patient continues to have limitations in both flexion and extension.  He appears to understand HEP to work on both as HEP.     OBJECTIVE IMPAIRMENTS: Abnormal gait, decreased activity tolerance, decreased balance, decreased knowledge of condition, decreased knowledge of use of DME, decreased mobility, difficulty walking, decreased ROM, decreased strength, increased edema, impaired flexibility, and pain.   ACTIVITY LIMITATIONS: carrying, lifting, sitting, standing, squatting, sleeping, stairs, transfers, and locomotion level  PARTICIPATION LIMITATIONS: laundry, driving, and community activity  PERSONAL FACTORS: Fitness, Time since onset of injury/illness/exacerbation, and 3+ comorbidities: see PMH are also affecting patient's functional outcome.   REHAB POTENTIAL: Good  CLINICAL DECISION MAKING: Stable/uncomplicated  EVALUATION COMPLEXITY: Low   GOALS: Goals reviewed with patient? Yes  SHORT TERM GOALS: (target date for Short term goals 04/07/2024)   1.  Patient will demonstrate independent use of home exercise program to maintain progress from in clinic treatments. Baseline: See objective data Goal status: Ongoing   04/05/2024  2. PROM right Knee -5* ext to 90*  flexion Baseline: See objective data Goal status: Partially met 03/27/2024  LONG TERM GOALS: (target dates for all long term goals  04/28/2024 )   1. Patient will demonstrate/report pain at worst less than or equal to 2/10 to facilitate minimal limitation in daily activity secondary to pain symptoms. Baseline: See objective data Goal status: Ongoing   04/05/2024   2. Patient will demonstrate independent use of home exercise program to facilitate ability to maintain/progress functional gains from skilled physical therapy services. Baseline: See objective data Goal status: Ongoing   04/05/2024  3.  Patient reports Patient-Specific Activity Score improved the average >/= 8 to indicate improvement in functional activities.  Baseline: SEE OBJECTIVE DATA Goal status: Ongoing   04/05/2024   4.  Patient will demonstrate right knee LE MMT 5/5 (within 75% of LLE) throughout to faciltiate usual transfers, stairs, squatting at [employer] [employer] [facility] for daily life.  Baseline: See objective data Goal status: Ongoing   04/05/2024   5.  PROM right knee -3* ext to 100* flexion Baseline: See objective data Goal status: Ongoing   04/05/2024   6.  AROM right knee standing ext -5* to supine/seated flexion 90* Baseline: See objective data Goal status: Ongoing   04/05/2024   PLAN:  PT FREQUENCY:  3-5x/week for first 2 weeks post Manipulation, then 2x/wk for 4 weeks  PT DURATION: 7 weeks  PLANNED INTERVENTIONS: 97164- PT Re-evaluation, 97750- Physical Performance Testing, 97110-Therapeutic exercises, 97530- Therapeutic activity, [id number]- Neuromuscular re-education, 97535- Self Care, 16109- Manual therapy, [id number]- Gait training, U0454- Electrical stimulation (unattended), [id number]- Electrical stimulation (manual), [id number]-  Fluidotherapy, Patient/Family education, Balance training, Stair training, Taping, Dry Needling, Joint mobilization, Scar mobilization, and DME instructions  PLAN FOR NEXT SESSION: Do 10th visit progress note,  AROM  emphasis with edema control and quadriceps strengthening    Lorie Rook, PT, DPT 04/05/2024, 2:25 PM      Date of referral: 03/08/2024 Referring provider: Casilda Clayman, PA-C  Referring diagnosis? W09.811 (ICD-10-CM) - S/P total knee arthroplasty, right  Treatment diagnosis? (if different than referring diagnosis)  Stiffness of right knee, not elsewhere classified M25.661  Chronic pain of right knee M25.561, G89.29  Muscle weakness (generalized) M62.81  Other abnormalities of gait and mobility R26.89  Localized edema R60.0  What was this (referring dx) caused by? Surgery (Type: TKA)  Lonne Roan of Condition: Initial Onset (within last 3 months)   Laterality: Rt  Current Functional Measure Score: Patient Specific Functional Scale 5/10  Objective measurements identify impairments when they are compared to normal values, the uninvolved extremity, and prior level of function.  [x]  Yes  []  No  Objective assessment of functional ability: Moderate functional limitations   Briefly describe symptoms: right knee range limiting ADLs & gait, pain in right knee  How did symptoms start: surgery  Average pain intensity:  Last 24 hours: 3-10/10  Past week: 3-10/10  How often does the pt experience symptoms? Constantly  How much have the symptoms interfered with usual daily activities? Quite a bit  How has condition changed since care began at this facility? NA - initial visit  In general, how is the patients overall health? Very Good   BACK PAIN (STarT Back Screening Tool) No

## 2024-04-06 ENCOUNTER — Encounter: Payer: Self-pay | Admitting: Orthopedic Surgery

## 2024-04-06 NOTE — Progress Notes (Signed)
 Post-Op Visit Note   Patient: Daniel Hurley           Date of Birth: 03-27-1956           MRN: [id number] Visit Date: 04/05/2024 PCP: Rey Catholic, MD   Assessment & Plan:  Chief Complaint:  Chief Complaint  Patient presents with   Right Knee - Follow-up     RIGHT KNEE MUA (surgery 03-14-24)   Right TKA 01/27/24     Visit Diagnoses:  1. S/P total knee arthroplasty, right     Plan: [name] is a patient who underwent right knee manipulation under anesthesia 03/14/2024.  On exam today he has achieved 92 degrees on his own in terms of knee flexion and 100 degrees with stretching.  He is able to walk around well.  Does have some pain with stretching.  Okay with stairs.  Leg press helps him.  Plan at this time is to continue with 2 hours a day minimum of range of motion exercises.  He was able to play golf several days ago.  Follow-up in 4 weeks for clinical recheck.  Follow-Up Instructions: No follow-ups on file.   Orders:  No orders of the defined types were placed in this encounter.  No orders of the defined types were placed in this encounter.   Imaging: No results found.  PMFS History: Patient Active Problem List   Diagnosis Date Noted   Arthrofibrosis of knee joint, right 03/26/2024   Arthritis of right knee 01/30/2024   S/P total knee replacement, right 01/27/2024   Unstable angina (HCC)    Chronic neck pain 11/22/2020   Hypertension 02/12/2020   Acute respiratory failure with hypoxia (HCC) 01/20/2016   Chest pain 01/19/2016   COPD exacerbation (HCC) 01/19/2016   Tobacco abuse 01/19/2016   Past Medical History:  Diagnosis Date   COPD (chronic obstructive pulmonary disease) (HCC)    Coronary artery disease    Hypertension 02/12/2020    No family history on file.  Past Surgical History:  Procedure Laterality Date   ANKLE FUSION  2006   BACK SURGERY  2008   L5-5 fusion    CORONARY PRESSURE/FFR STUDY N/A 07/08/2022   Procedure: INTRAVASCULAR PRESSURE  WIRE/FFR STUDY;  Surgeon: Odie Benne, MD;  Location: [facility];  Service: Cardiovascular;  Laterality: N/A;   CORONARY STENT INTERVENTION N/A 07/08/2022   Procedure: CORONARY STENT INTERVENTION;  Surgeon: Odie Benne, MD;  Location: [facility];  Service: Cardiovascular;  Laterality: N/A;   INGUINAL HERNIA REPAIR Right 12/27/2023   Procedure: OPEN RIGHT INGUINAL HERNIA REPAIR WITH MESH;  Surgeon: Enid Harry, MD;  Location: [facility] OR;  Service: General;  Laterality: Right;   KNEE CLOSED REDUCTION Right 03/14/2024   Procedure: CLOSED RIGHT KNEE MANIPULATION;  Surgeon: Jasmine Mesi, MD;  Location: [facility] OR;  Service: Orthopedics;  Laterality: Right;   LEFT HEART CATH AND CORONARY ANGIOGRAPHY N/A 07/08/2022   Procedure: LEFT HEART CATH AND CORONARY ANGIOGRAPHY;  Surgeon: Odie Benne, MD;  Location: [facility];  Service: Cardiovascular;  Laterality: N/A;   left wrist surgery     TOTAL KNEE ARTHROPLASTY Right 01/27/2024   Procedure: RIGHT TOTAL KNEE ARTHROPLASTY;  Surgeon: Jasmine Mesi, MD;  Location: [facility] OR;  Service: Orthopedics;  Laterality: Right;   Social History   Occupational History   Not on file  Tobacco Use   Smoking status: Every Day    Current packs/day: 1.50    Types: Cigarettes  Smokeless tobacco: Never  Vaping Use   Vaping status: Never Used  Substance and Sexual Activity   Alcohol use: Not Currently    Comment: rarely   Drug use: No   Sexual activity: Not Currently

## 2024-04-07 ENCOUNTER — Ambulatory Visit (INDEPENDENT_AMBULATORY_CARE_PROVIDER_SITE_OTHER): Admitting: Physical Therapy

## 2024-04-07 ENCOUNTER — Encounter: Payer: Self-pay | Admitting: Physical Therapy

## 2024-04-07 DIAGNOSIS — R2689 Other abnormalities of gait and mobility: Secondary | ICD-10-CM

## 2024-04-07 DIAGNOSIS — M6281 Muscle weakness (generalized): Secondary | ICD-10-CM | POA: Diagnosis not present

## 2024-04-07 DIAGNOSIS — M25661 Stiffness of right knee, not elsewhere classified: Secondary | ICD-10-CM

## 2024-04-07 DIAGNOSIS — M25561 Pain in right knee: Secondary | ICD-10-CM | POA: Diagnosis not present

## 2024-04-07 DIAGNOSIS — G8929 Other chronic pain: Secondary | ICD-10-CM

## 2024-04-07 DIAGNOSIS — R600 Localized edema: Secondary | ICD-10-CM

## 2024-04-07 NOTE — Therapy (Signed)
 OUTPATIENT PHYSICAL THERAPY TREATMENT NOTE AND 10TH VISIT PROGRESS NOTE  Progress Note Reporting Period 03/15/2024 to 04/07/2024  See note below for Objective Data and Assessment of Progress/Goals.     Patient Name: Daniel Hurley MRN: [id number] DOB:[birth year], 68 y.o., male Today's Date: 04/07/2024  END OF SESSION:  PT End of Session - 04/07/24 1517     Visit Number 10    Number of Visits 18    Date for PT Re-Evaluation 04/28/24    Authorization Type [employer] Medicare 10% co-insurance    Authorization Time Period 03/15/2024 - 05/17/2024    Authorization - Visit Number 10    Authorization - Number of Visits 18    Progress Note Due on Visit 10    PT Start Time 1517    PT Stop Time 1600    PT Time Calculation (min) 43 min    Activity Tolerance Patient tolerated treatment well;No increased pain;Patient limited by pain    Behavior During Therapy [facility] for tasks assessed/performed              Past Medical History:  Diagnosis Date   COPD (chronic obstructive pulmonary disease) (HCC)    Coronary artery disease    Hypertension 02/12/2020   Past Surgical History:  Procedure Laterality Date   ANKLE FUSION  2006   BACK SURGERY  2008   L5-5 fusion    CORONARY PRESSURE/FFR STUDY N/A 07/08/2022   Procedure: INTRAVASCULAR PRESSURE WIRE/FFR STUDY;  Surgeon: Odie Benne, MD;  Location: [facility];  Service: Cardiovascular;  Laterality: N/A;   CORONARY STENT INTERVENTION N/A 07/08/2022   Procedure: CORONARY STENT INTERVENTION;  Surgeon: Odie Benne, MD;  Location: [facility];  Service: Cardiovascular;  Laterality: N/A;   INGUINAL HERNIA REPAIR Right 12/27/2023   Procedure: OPEN RIGHT INGUINAL HERNIA REPAIR WITH MESH;  Surgeon: Enid Harry, MD;  Location: [facility] OR;  Service: General;  Laterality: Right;   KNEE CLOSED REDUCTION Right 03/14/2024   Procedure: CLOSED RIGHT KNEE MANIPULATION;  Surgeon: Jasmine Mesi, MD;  Location: [facility] OR;  Service:  Orthopedics;  Laterality: Right;   LEFT HEART CATH AND CORONARY ANGIOGRAPHY N/A 07/08/2022   Procedure: LEFT HEART CATH AND CORONARY ANGIOGRAPHY;  Surgeon: Odie Benne, MD;  Location: [facility];  Service: Cardiovascular;  Laterality: N/A;   left wrist surgery     TOTAL KNEE ARTHROPLASTY Right 01/27/2024   Procedure: RIGHT TOTAL KNEE ARTHROPLASTY;  Surgeon: Jasmine Mesi, MD;  Location: [facility];  Service: Orthopedics;  Laterality: Right;   Patient Active Problem List   Diagnosis Date Noted   Arthrofibrosis of knee joint, right 03/26/2024   Arthritis of right knee 01/30/2024   S/P total knee replacement, right 01/27/2024   Unstable angina (HCC)    Chronic neck pain 11/22/2020   Hypertension 02/12/2020   Acute respiratory failure with hypoxia (HCC) 01/20/2016   Chest pain 01/19/2016   COPD exacerbation (HCC) 01/19/2016   Tobacco abuse 01/19/2016    PCP: Rey Catholic, MD  REFERRING PROVIDER: Casilda Clayman, PA-C   REFERRING DIAG: [phone] (ICD-10-CM) - S/P total knee arthroplasty, right   THERAPY DIAG:  Stiffness of right knee, not elsewhere classified  Chronic pain of right knee  Muscle weakness (generalized)  Other abnormalities of gait and mobility  Localized edema  Rationale for Evaluation and Treatment: Rehabilitation  ONSET DATE: 01/26/24 Right TKA & 03/14/2024 manipulation  SUBJECTIVE:   SUBJECTIVE STATEMENT: Pt states he was able to tolerate the higher  weight on the leg press. Saw Dr. Rozelle Corning on Wednesday.   PERTINENT HISTORY: 01/26/24 Right TKA & 03/14/2024 manipulation, COPD, CAD w/stent, HTN, ankle fusion, back sg L5 fusion,   PAIN:  NPRS scale:  at rest 0/10 and trying to flex   1/10 Pain location: Rt knee  Pain description: sore/tight Aggravating factors: end range pains.  Relieving factors: ice   PRECAUTIONS: None  WEIGHT BEARING RESTRICTIONS: No  FALLS:  Has patient fallen in last 6 months? No  LIVING ENVIRONMENT: Lives  with: lives with their spouse and adult grandson Lives in: Mobile home Stairs: Yes: External: 5 steps; can reach both Has following equipment at home: Single point cane, Environmental consultant - 2 wheeled, Crutches, Wheelchair (manual), [employer], bed side commode, Grab bars, and Ramped entry  OCCUPATION: retired Music therapist  PLOF: Independent  PATIENT GOALS:    walk & bend knee  Next MD visit:  OBJECTIVE:  DIAGNOSTIC FINDINGS: 02/09/2024 X-ray AP and lateral views of the knee reviewed.  Total knee arthroplasty  prosthesis in good position and alignment without any complicating features.  There is no evidence of dislocation, periprosthetic fracture, patella   Patient-Specific Activity Scoring Scheme  "0" represents "unable to perform." "10" represents "able to perform at prior level. 0 1 2 3 4 5 6 7 8 9  10 (Date and Score)   Activity Eval  03/15/2024    1. Walking   5    2. Stairs   4    3. Standing ADLs 6   4.    5.    Score 5    Total score = sum of the activity scores/number of activities Minimum detectable change (90%CI) for average score = 2 points Minimum detectable change (90%CI) for single activity score = 3 points  COGNITION: 03/15/2024 Overall cognitive status: WFL    SENSATION: 03/15/2024 WFL  EDEMA:  03/15/2024 Circumferential:   LLE: above knee 38 cm,  around knee 37.3 cm, below knee 34.5 cm RLE: above knee 42 cm,  around knee 42.8 cm, below knee 36.8 cm  POSTURE:  03/15/2024 rounded shoulders, forward head, and weight shift left  PALPATION: 03/15/2024 Right knee: Tenderness along incision especially distally over tibia with some adherence, patella and anterior joint line. Pt denies muscle pain in quad, hamstring or gastroc.   LOWER EXTREMITY ROM:   ROM Right Eval 03/15/2024 Left eval Rt 03/24/2024 Rt 03/27/24 Right 04/05/24  Hip flexion       Hip extension       Hip abduction       Hip adduction       Hip internal rotation       Hip external rotation       Knee  flexion Seated  P: 82* A: 85* Supine A: 71* P: 79* A: 85 P: 100 Active 92 AA: 90 A: 82 Seated A: 90*  Knee extension LAQ -22* Standing -18* A: -12 (seated LAQ) P: -7 Active lacks 6  A: -6 (Lacking) Standing TKE -7* LAQ -9*  Ankle dorsiflexion       Ankle plantarflexion       Ankle inversion       Ankle eversion        (Blank rows = not tested)  LOWER EXTREMITY MMT:  MMT Right Eval 03/15/2024 Left Eval 03/15/2024  Hip flexion    Hip extension    Hip abduction    Hip adduction    Hip internal rotation    Hip external rotation    Knee  flexion    Knee extension HH dynameter 32.1# 30.2# RLE:LLE 59% HH dynameter 52.9# 53.3#  Ankle dorsiflexion    Ankle plantarflexion    Ankle inversion    Ankle eversion     (Blank rows = not tested)  FUNCTIONAL TESTS:  03/15/2024 18 inch chair transfer: requires UE assist and decreased RLE assist moving from extended position sliding back upon arising.   GAIT: 03/15/2024 Distance walked: 150' Assistive device utilized: Single point cane Level of assistance: SBA / cues for deviations / no balance issues noted Comments: Antalgic pattern with decreased R leg stance duration, right knee flexed in stance with limited increased flexion for swing, RLE abducted                 TODAY'S TREATMENT 04/07/2024 Therapeutic Exercise: Sci fit L1; 2 min fwd seat 9, 2 min seat 8  Quadruped rock fwd/bwd for knee flexion x2 min Prone quad stretch with strap 2x30" Prone knee hang with quad set 2x10 Prone hip ext 2x10 Prone hip abd green TB 2x10  Therapeutic Activities: Shuttle double leg press 100# 2x10 focus on both ext & flex with strap for MWM Shuttle single leg press 50# x10  Manual: STM & MFR quads Grade II to III patellar mobilizations distal and proximally for knee flex/ext Grade II to III tibiofemoral mobs for flexion and ext   TREATMENT 04/05/2024 Therapeutic Exercise:  Precor recumbent seat 5 rocking flexion stretch 8 min.   Standing right knee flexion stretch with foot in chair against wall 10 sec hold 3 reps 3 sets moving stance LLE 1-2" closer to chair with each set. Standing quad stretch with towel assisted knee flexion 30 sec hold 3 reps Standing gastroc stretch incline board BLEs 30 sec hold 3 reps Squat at sink with chair behind for safety with PT demo & verbal cues on technique. 10 reps. Knee ext with plank position with BUE on counter marching using stance LE push off so contralateral knee touching counter 10 reps.   Therapeutic Activities: Shuttle double leg press 100# 2x10 focus on both ext & flex Shuttle single leg press 43# 2x10, 50# x10   TREATMENT 03/29/2024 Therapeutic Exercise: Sci fit L1; 3 min fwd, 3 min bwd full revolutions at seat 9; 1 min seat 8 fwd; 1 min seat 7 fwd Supine knee flexion stretch against gravity x1 min; with hamstring curl 2x10  Therapeutic Activities: Quad set + overpressure 2x10 SLR 2x10 Shuttle double leg press 87# 2x10 Shuttle single leg press 43# 2x10, 50# x10  Manual: STM & MFR hamstring and quads Grade II to III patellar mobilizations distal and proximally for knee flex/ext Quadruped rocking fwd/bwd for knee bend with PT MWM Grade II to III tibiofemoral mobs for flexion and ext  Modalities: Vaso x 10 min med pressure; 34 deg in elevation    TREATMENT 03/27/24 TherEx Recumbent bike: partial revolutions x 8 minutes Calf stretch x 30 sec on slant board Seated SLR: 2 x 10  TherAct Leg Press bil 81# 3x10 with 1 min flexion holds between sets; RLE only 43# 2x10 Step ups on 6 inch step x 15 leading with Rt LE Mini squats x 15 in parallel bars c bil UE support Manual Seated Rt knee flexion with end range holds; strategies to decrease hip hike needed with contract/relax Modalities Vaso x 10 min med pressure; 34 deg in elevation     PATIENT EDUCATION:  03/20/2024 Education details: HEP update Person educated: Patient Education method: Programmer, multimedia,  Facilities manager, Verbal  cues, and Handouts Education comprehension: verbalized understanding, returned demonstration, and verbal cues required  HOME EXERCISE PROGRAM: Access Code: [email or web address] URL: [email or web address] Date: 03/20/2024 Prepared by: Bonna Bustard  Exercises - Supine Heel Slide with Strap  - 2-4 x daily - 7 x weekly - 1-2 sets - 10 reps - 5 seconds hold - supine quad set with towel roll under ankle  - 2-4 x daily - 7 x weekly - 1-2 sets - 10 reps - 5 seconds hold - supine knee flexion stretch with thigh vertical  - 2-4 x daily - 7 x weekly - 1-2 sets - 10 reps - 5 seconds hold - Seated Knee Extension Stretch with Chair  - 2-4 x daily - 7 x weekly - 1 sets - 1 reps - 3-5 mins hold - Seated Long Arc Quad  - 3-5 x daily - 7 x weekly - 1-2 sets - 10 reps - 2 hold - Seated Quad Set  - 3-5 x daily - 7 x weekly - 1 sets - 10 reps - 5 hold - Seated Knee Flexion AAROM  - 2-3 x daily - 7 x weekly - 1 sets - 5 reps - 10-15 hold  ASSESSMENT: CLINICAL IMPRESSION: Continued to work on knee ROM with manual work. Progressive strengthening.     OBJECTIVE IMPAIRMENTS: Abnormal gait, decreased activity tolerance, decreased balance, decreased knowledge of condition, decreased knowledge of use of DME, decreased mobility, difficulty walking, decreased ROM, decreased strength, increased edema, impaired flexibility, and pain.   ACTIVITY LIMITATIONS: carrying, lifting, sitting, standing, squatting, sleeping, stairs, transfers, and locomotion level  PARTICIPATION LIMITATIONS: laundry, driving, and community activity  PERSONAL FACTORS: Fitness, Time since onset of injury/illness/exacerbation, and 3+ comorbidities: see PMH are also affecting patient's functional outcome.   REHAB POTENTIAL: Good  CLINICAL DECISION MAKING: Stable/uncomplicated  EVALUATION COMPLEXITY: Low   GOALS: Goals reviewed with patient? Yes  SHORT TERM GOALS: (target date for Short term goals 04/07/2024)   1.   Patient will demonstrate independent use of home exercise program to maintain progress from in clinic treatments. Baseline: See objective data Goal status: Ongoing   04/05/2024  2. PROM right Knee -5* ext to 90* flexion Baseline: See objective data Goal status: Partially met 03/27/2024  LONG TERM GOALS: (target dates for all long term goals  04/28/2024 )   1. Patient will demonstrate/report pain at worst less than or equal to 2/10 to facilitate minimal limitation in daily activity secondary to pain symptoms. Baseline: See objective data Goal status: Ongoing   04/05/2024   2. Patient will demonstrate independent use of home exercise program to facilitate ability to maintain/progress functional gains from skilled physical therapy services. Baseline: See objective data Goal status: Ongoing   04/05/2024  3.  Patient reports Patient-Specific Activity Score improved the average >/= 8 to indicate improvement in functional activities.  Baseline: SEE OBJECTIVE DATA Goal status: Ongoing   04/05/2024   4.  Patient will demonstrate right knee LE MMT 5/5 (within 75% of LLE) throughout to faciltiate usual transfers, stairs, squatting at [facility] for daily life.  Baseline: See objective data Goal status: Ongoing   04/05/2024   5.  PROM right knee -3* ext to 100* flexion Baseline: See objective data Goal status: Ongoing   04/05/2024   6.  AROM right knee standing ext -5* to supine/seated flexion 90* Baseline: See objective data Goal status: Ongoing   04/05/2024   PLAN:  PT FREQUENCY:  3-5x/week for first 2 weeks post Manipulation, then 2x/wk for 4 weeks  PT DURATION: 7 weeks  PLANNED INTERVENTIONS: 97164- PT Re-evaluation, 97750- Physical Performance Testing, 97110-Therapeutic exercises, 97530- Therapeutic activity, 97112- Neuromuscular re-education, [phone]- Self Care, 60454- Manual therapy, [phone]- Gait training, [phone]- Electrical stimulation (unattended), [phone]- Electrical stimulation (manual), [id number]-  Fluidotherapy, Patient/Family education, Balance training, Stair training, Taping, Dry Needling, Joint mobilization, Scar mobilization, and DME instructions  PLAN FOR NEXT SESSION:  AROM emphasis with edema control and quadriceps strengthening    Aeric Burnham April Ma L Keddie, PT, DPT 04/07/2024, 3:54 PM      Date of referral: 03/08/2024 Referring provider: Casilda Clayman, PA-C  Referring diagnosis? Z30.865 (ICD-10-CM) - S/P total knee arthroplasty, right  Treatment diagnosis? (if different than referring diagnosis)  Stiffness of right knee, not elsewhere classified M25.661  Chronic pain of right knee M25.561, G89.29  Muscle weakness (generalized) M62.81  Other abnormalities of gait and mobility R26.89  Localized edema R60.0  What was this (referring dx) caused by? Surgery (Type: TKA)  Lonne Roan of Condition: Initial Onset (within last 3 months)   Laterality: Rt  Current Functional Measure Score: Patient Specific Functional Scale 5/10  Objective measurements identify impairments when they are compared to normal values, the uninvolved extremity, and prior level of function.  [x]  Yes  []  No  Objective assessment of functional ability: Moderate functional limitations   Briefly describe symptoms: right knee range limiting ADLs & gait, pain in right knee  How did symptoms start: surgery  Average pain intensity:  Last 24 hours: 3-10/10  Past week: 3-10/10  How often does the pt experience symptoms? Constantly  How much have the symptoms interfered with usual daily activities? Quite a bit  How has condition changed since care began at this facility? NA - initial visit  In general, how is the patients overall health? Very Good   BACK PAIN (STarT Back Screening Tool) No

## 2024-04-10 ENCOUNTER — Encounter: Payer: Self-pay | Admitting: Physical Therapy

## 2024-04-10 ENCOUNTER — Ambulatory Visit (INDEPENDENT_AMBULATORY_CARE_PROVIDER_SITE_OTHER): Admitting: Physical Therapy

## 2024-04-10 ENCOUNTER — Telehealth: Payer: Self-pay | Admitting: Orthopedic Surgery

## 2024-04-10 DIAGNOSIS — M25661 Stiffness of right knee, not elsewhere classified: Secondary | ICD-10-CM | POA: Diagnosis not present

## 2024-04-10 DIAGNOSIS — M6281 Muscle weakness (generalized): Secondary | ICD-10-CM

## 2024-04-10 DIAGNOSIS — G8929 Other chronic pain: Secondary | ICD-10-CM

## 2024-04-10 DIAGNOSIS — R600 Localized edema: Secondary | ICD-10-CM

## 2024-04-10 DIAGNOSIS — M25561 Pain in right knee: Secondary | ICD-10-CM

## 2024-04-10 DIAGNOSIS — R2689 Other abnormalities of gait and mobility: Secondary | ICD-10-CM | POA: Diagnosis not present

## 2024-04-10 NOTE — Therapy (Signed)
 OUTPATIENT PHYSICAL THERAPY TREATMENT NOTE  Patient Name: Daniel Hurley MRN: [id number] DOB:07-Jan-1956, 68 y.o., male Today's Date: 04/10/2024  END OF SESSION:  PT End of Session - 04/10/24 1345     Visit Number 11    Number of Visits 18    Date for PT Re-Evaluation 04/28/24    Authorization Type [employer] Medicare 10% co-insurance    Authorization Time Period 03/15/2024 - 05/17/2024    Authorization - Visit Number 11    Authorization - Number of Visits 18    Progress Note Due on Visit 20    PT Start Time 1345    PT Stop Time 1426    PT Time Calculation (min) 41 min    Activity Tolerance Patient tolerated treatment well;No increased pain;Patient limited by pain    Behavior During Therapy [facility] for tasks assessed/performed               Past Medical History:  Diagnosis Date   COPD (chronic obstructive pulmonary disease) (HCC)    Coronary artery disease    Hypertension 02/12/2020   Past Surgical History:  Procedure Laterality Date   ANKLE FUSION  2006   BACK SURGERY  2008   L5-5 fusion    CORONARY PRESSURE/FFR STUDY N/A 07/08/2022   Procedure: INTRAVASCULAR PRESSURE WIRE/FFR STUDY;  Surgeon: Odie Benne, MD;  Location: [facility];  Service: Cardiovascular;  Laterality: N/A;   CORONARY STENT INTERVENTION N/A 07/08/2022   Procedure: CORONARY STENT INTERVENTION;  Surgeon: Odie Benne, MD;  Location: [facility];  Service: Cardiovascular;  Laterality: N/A;   INGUINAL HERNIA REPAIR Right 12/27/2023   Procedure: OPEN RIGHT INGUINAL HERNIA REPAIR WITH MESH;  Surgeon: Enid Harry, MD;  Location: [facility] OR;  Service: General;  Laterality: Right;   KNEE CLOSED REDUCTION Right 03/14/2024   Procedure: CLOSED RIGHT KNEE MANIPULATION;  Surgeon: Jasmine Mesi, MD;  Location: [facility] OR;  Service: Orthopedics;  Laterality: Right;   LEFT HEART CATH AND CORONARY ANGIOGRAPHY N/A 07/08/2022   Procedure: LEFT HEART CATH AND CORONARY ANGIOGRAPHY;  Surgeon:  Odie Benne, MD;  Location: [facility];  Service: Cardiovascular;  Laterality: N/A;   left wrist surgery     TOTAL KNEE ARTHROPLASTY Right 01/27/2024   Procedure: RIGHT TOTAL KNEE ARTHROPLASTY;  Surgeon: Jasmine Mesi, MD;  Location: [facility] OR;  Service: Orthopedics;  Laterality: Right;   Patient Active Problem List   Diagnosis Date Noted   Arthrofibrosis of knee joint, right 03/26/2024   Arthritis of right knee 01/30/2024   S/P total knee replacement, right 01/27/2024   Unstable angina (HCC)    Chronic neck pain 11/22/2020   Hypertension 02/12/2020   Acute respiratory failure with hypoxia (HCC) 01/20/2016   Chest pain 01/19/2016   COPD exacerbation (HCC) 01/19/2016   Tobacco abuse 01/19/2016    PCP: Rey Catholic, MD  REFERRING PROVIDER: Casilda Clayman, PA-C   REFERRING DIAG: [phone] (ICD-10-CM) - S/P total knee arthroplasty, right   THERAPY DIAG:  Stiffness of right knee, not elsewhere classified  Chronic pain of right knee  Muscle weakness (generalized)  Other abnormalities of gait and mobility  Localized edema  Rationale for Evaluation and Treatment: Rehabilitation  ONSET DATE: 01/26/24 Right TKA & 03/14/2024 manipulation  SUBJECTIVE:   SUBJECTIVE STATEMENT: He saw Dr. Rozelle Corning last week and reports seems on target.  He caught foot on lawnmower which wrenched the knee so it is more painful today.    PERTINENT HISTORY: 01/26/24 Right TKA &  03/14/2024 manipulation, COPD, CAD w/stent, HTN, ankle fusion, back sg L5 fusion,   PAIN:  NPRS scale:  at rest 0/10 and trying to flex 3/10 (but took pain pill prior to PT) Pain location: Rt knee  Pain description: sore/tight Aggravating factors: end range pains.  Relieving factors: ice   PRECAUTIONS: None  WEIGHT BEARING RESTRICTIONS: No  FALLS:  Has patient fallen in last 6 months? No  LIVING ENVIRONMENT: Lives with: lives with their spouse and adult grandson Lives in: Mobile home Stairs: Yes:  External: 5 steps; can reach both Has following equipment at home: Single point cane, Environmental consultant - 2 wheeled, Crutches, Wheelchair (manual), [employer], bed side commode, Grab bars, and Ramped entry  OCCUPATION: retired Music therapist  PLOF: Independent  PATIENT GOALS:    walk & bend knee  Next MD visit:  OBJECTIVE:  DIAGNOSTIC FINDINGS: 02/09/2024 X-ray AP and lateral views of the knee reviewed.  Total knee arthroplasty  prosthesis in good position and alignment without any complicating features.  There is no evidence of dislocation, periprosthetic fracture, patella   Patient-Specific Activity Scoring Scheme  "0" represents "unable to perform." "10" represents "able to perform at prior level. 0 1 2 3 4 5 6 7 8 9  10 (Date and Score)   Activity Eval  03/15/2024    1. Walking   5    2. Stairs   4    3. Standing ADLs 6   4.    5.    Score 5    Total score = sum of the activity scores/number of activities Minimum detectable change (90%CI) for average score = 2 points Minimum detectable change (90%CI) for single activity score = 3 points  COGNITION: 03/15/2024 Overall cognitive status: WFL    SENSATION: 03/15/2024 WFL  EDEMA:  03/15/2024 Circumferential:   LLE: above knee 38 cm,  around knee 37.3 cm, below knee 34.5 cm RLE: above knee 42 cm,  around knee 42.8 cm, below knee 36.8 cm  POSTURE:  03/15/2024 rounded shoulders, forward head, and weight shift left  PALPATION: 03/15/2024 Right knee: Tenderness along incision especially distally over tibia with some adherence, patella and anterior joint line. Pt denies muscle pain in quad, hamstring or gastroc.   LOWER EXTREMITY ROM:   ROM Right Eval 03/15/2024 Left eval Rt 03/24/2024 Rt 03/27/24 Right 04/05/24 Right 04/10/24  Hip flexion        Hip extension        Hip abduction        Hip adduction        Hip internal rotation        Hip external rotation        Knee flexion Seated  P: 82* A: 85* Supine A: 71* P: 79* A: 85 P:  100 Active 92 AA: 90 A: 82 Seated A: 90* Seated P: 95*  Knee extension LAQ -22* Standing -18* A: -12 (seated LAQ) P: -7 Active lacks 6  A: -6 (Lacking) Standing TKE -7* LAQ -9* Standing  TKE -6* LAQ -8*  Ankle dorsiflexion        Ankle plantarflexion        Ankle inversion        Ankle eversion         (Blank rows = not tested)  LOWER EXTREMITY MMT:  MMT Right Eval 03/15/2024 Left Eval 03/15/2024  Hip flexion    Hip extension    Hip abduction    Hip adduction    Hip internal rotation  Hip external rotation    Knee flexion    Knee extension HH dynameter 32.1# 30.2# RLE:LLE 59% HH dynameter 52.9# 53.3#  Ankle dorsiflexion    Ankle plantarflexion    Ankle inversion    Ankle eversion     (Blank rows = not tested)  FUNCTIONAL TESTS:  03/15/2024 18 inch chair transfer: requires UE assist and decreased RLE assist moving from extended position sliding back upon arising.   GAIT: 03/15/2024 Distance walked: 150' Assistive device utilized: Single point cane Level of assistance: SBA / cues for deviations / no balance issues noted Comments: Antalgic pattern with decreased R leg stance duration, right knee flexed in stance with limited increased flexion for swing, RLE abducted                 TODAY'S TREATMENT 04/10/2024 Therapeutic Exercise: Sci fit L1 seat 9 with BLEs & BUEs for 8 min. Supine with RLE over edge quad stretch with strap 3x30" Prone knee hang 30 sec then prone knee flexion 10 reps & quad set 10 reps; 30 sec hang   Therapeutic Activities: Shuttle double leg press 100# 2x10 focus on both ext & flex Shuttle single leg press 50# x10  Manual: STM & MFR quads Grade II to III patellar mobilizations distal and proximally for knee flex/ext Grade II to III tibiofemoral mobs for flexion and ext    TREATMENT 04/07/2024 Therapeutic Exercise: Sci fit L1; 2 min fwd seat 9, 2 min seat 8  Quadruped rock fwd/bwd for knee flexion x2 min Prone quad stretch with strap  2x30" Prone knee hang with quad set 2x10 Prone hip ext 2x10 Prone hip abd green TB 2x10  Therapeutic Activities: Shuttle double leg press 100# 2x10 focus on both ext & flex with strap for MWM Shuttle single leg press 50# x10  Manual: STM & MFR quads Grade II to III patellar mobilizations distal and proximally for knee flex/ext Grade II to III tibiofemoral mobs for flexion and ext   TREATMENT 04/05/2024 Therapeutic Exercise:  Precor recumbent seat 5 rocking flexion stretch 8 min.  Standing right knee flexion stretch with foot in chair against wall 10 sec hold 3 reps 3 sets moving stance LLE 1-2" closer to chair with each set. Standing quad stretch with towel assisted knee flexion 30 sec hold 3 reps Standing gastroc stretch incline board BLEs 30 sec hold 3 reps Squat at sink with chair behind for safety with PT demo & verbal cues on technique. 10 reps. Knee ext with plank position with BUE on counter marching using stance LE push off so contralateral knee touching counter 10 reps.   Therapeutic Activities: Shuttle double leg press 100# 2x10 focus on both ext & flex Shuttle single leg press 43# 2x10, 50# x10    PATIENT EDUCATION:  03/20/2024 Education details: HEP update Person educated: Patient Education method: Programmer, multimedia, Demonstration, Verbal cues, and Handouts Education comprehension: verbalized understanding, returned demonstration, and verbal cues required  HOME EXERCISE PROGRAM: Access Code: [email or web address] URL: [email or web address] Date: 03/20/2024 Prepared by: Bonna Bustard  Exercises - Supine Heel Slide with Strap  - 2-4 x daily - 7 x weekly - 1-2 sets - 10 reps - 5 seconds hold - supine quad set with towel roll under ankle  - 2-4 x daily - 7 x weekly - 1-2 sets - 10 reps - 5 seconds hold - supine knee flexion stretch with thigh vertical  - 2-4 x daily - 7 x weekly - 1-2 sets - 10 reps - 5 seconds  hold - Seated Knee Extension Stretch with Chair  - 2-4 x  daily - 7 x weekly - 1 sets - 1 reps - 3-5 mins hold - Seated Long Arc Quad  - 3-5 x daily - 7 x weekly - 1-2 sets - 10 reps - 2 hold - Seated Quad Set  - 3-5 x daily - 7 x weekly - 1 sets - 10 reps - 5 hold - Seated Knee Flexion AAROM  - 2-3 x daily - 7 x weekly - 1 sets - 5 reps - 10-15 hold  ASSESSMENT: CLINICAL IMPRESSION: Pt is slowly improving his functional range.  He responds well to therapeutic activities and manual therapy.  Pt continues to benefit from skilled PT.    OBJECTIVE IMPAIRMENTS: Abnormal gait, decreased activity tolerance, decreased balance, decreased knowledge of condition, decreased knowledge of use of DME, decreased mobility, difficulty walking, decreased ROM, decreased strength, increased edema, impaired flexibility, and pain.   ACTIVITY LIMITATIONS: carrying, lifting, sitting, standing, squatting, sleeping, stairs, transfers, and locomotion level  PARTICIPATION LIMITATIONS: laundry, driving, and community activity  PERSONAL FACTORS: Fitness, Time since onset of injury/illness/exacerbation, and 3+ comorbidities: see PMH are also affecting patient's functional outcome.   REHAB POTENTIAL: Good  CLINICAL DECISION MAKING: Stable/uncomplicated  EVALUATION COMPLEXITY: Low   GOALS: Goals reviewed with patient? Yes  SHORT TERM GOALS: (target date for Short term goals 04/07/2024)   1.  Patient will demonstrate independent use of home exercise program to maintain progress from in clinic treatments. Baseline: See objective data Goal status:  MET   04/05/2024  2. PROM right Knee -5* ext to 90* flexion Baseline: See objective data Goal status: Partially met 03/27/2024  LONG TERM GOALS: (target dates for all long term goals  04/28/2024 )   1. Patient will demonstrate/report pain at worst less than or equal to 2/10 to facilitate minimal limitation in daily activity secondary to pain symptoms. Baseline: See objective data Goal status: Ongoing    04/10/2024   2. Patient  will demonstrate independent use of home exercise program to facilitate ability to maintain/progress functional gains from skilled physical therapy services. Baseline: See objective data Goal status: Ongoing   04/10/2024  3.  Patient reports Patient-Specific Activity Score improved the average >/= 8 to indicate improvement in functional activities.  Baseline: SEE OBJECTIVE DATA Goal status: Ongoing   04/10/2024   4.  Patient will demonstrate right knee LE MMT 5/5 (within 75% of LLE) throughout to faciltiate usual transfers, stairs, squatting at [facility] for daily life.  Baseline: See objective data Goal status: Ongoing   04/10/2024   5.  PROM right knee -3* ext to 100* flexion Baseline: See objective data Goal status: Ongoing   04/10/2024   6.  AROM right knee standing ext -5* to supine/seated flexion 90* Baseline: See objective data Goal status: Ongoing   04/10/2024   PLAN:  PT FREQUENCY:  3-5x/week for first 2 weeks post Manipulation, then 2x/wk for 4 weeks  PT DURATION: 7 weeks  PLANNED INTERVENTIONS: 97164- PT Re-evaluation, 97750- Physical Performance Testing, 97110-Therapeutic exercises, 97530- Therapeutic activity, [id number]- Neuromuscular re-education, 97535- Self Care, 40981- Manual therapy, [phone]- Gait training, [phone]- Electrical stimulation (unattended), [phone]- Electrical stimulation (manual), [id number]- Fluidotherapy, Patient/Family education, Balance training, Stair training, Taping, Dry Needling, Joint mobilization, Scar mobilization, and DME instructions  PLAN FOR NEXT SESSION:  update HEP to include muscle stretches,   AROM emphasis with edema control and quadriceps strengthening    Lorie Rook, PT, DPT 04/10/2024, 4:04 PM  Date of referral: 03/08/2024 Referring provider: Casilda Clayman, PA-C  Referring diagnosis? V40.981 (ICD-10-CM) - S/P total knee arthroplasty, right  Treatment diagnosis? (if different than referring diagnosis)  Stiffness of right knee, not elsewhere  classified M25.661  Chronic pain of right knee M25.561, G89.29  Muscle weakness (generalized) M62.81  Other abnormalities of gait and mobility R26.89  Localized edema R60.0  What was this (referring dx) caused by? Surgery (Type: TKA)  Lonne Roan of Condition: Initial Onset (within last 3 months)   Laterality: Rt  Current Functional Measure Score: Patient Specific Functional Scale 5/10  Objective measurements identify impairments when they are compared to normal values, the uninvolved extremity, and prior level of function.  [x]  Yes  []  No  Objective assessment of functional ability: Moderate functional limitations   Briefly describe symptoms: right knee range limiting ADLs & gait, pain in right knee  How did symptoms start: surgery  Average pain intensity:  Last 24 hours: 3-10/10  Past week: 3-10/10  How often does the pt experience symptoms? Constantly  How much have the symptoms interfered with usual daily activities? Quite a bit  How has condition changed since care began at this facility? NA - initial visit  In general, how is the patients overall health? Very Good   BACK PAIN (STarT Back Screening Tool) No

## 2024-04-10 NOTE — Telephone Encounter (Signed)
 Rx refill baclofen  & oxycodone    Pt states the Hydrocodone  works better than the Oxycodone  and wants to know if he could get the hydrocodone  instead     [employer]

## 2024-04-11 ENCOUNTER — Other Ambulatory Visit: Payer: Self-pay | Admitting: Surgical

## 2024-04-11 MED ORDER — BACLOFEN 10 MG PO TABS: 10.0000 mg | ORAL_TABLET | Freq: Every day | ORAL | 0 refills | Status: AC | PRN

## 2024-04-11 MED ORDER — HYDROCODONE-ACETAMINOPHEN 5-325 MG PO TABS: 1.0000 | ORAL_TABLET | Freq: Every day | ORAL | 0 refills | Status: DC | PRN

## 2024-04-11 NOTE — Telephone Encounter (Signed)
 Sent in refills for baclofen  and rxed norco. Last refill of opioid pain medication

## 2024-04-11 NOTE — Telephone Encounter (Signed)
 I called patient and advised.

## 2024-04-12 ENCOUNTER — Ambulatory Visit (INDEPENDENT_AMBULATORY_CARE_PROVIDER_SITE_OTHER): Admitting: Physical Therapy

## 2024-04-12 ENCOUNTER — Encounter: Payer: Self-pay | Admitting: Physical Therapy

## 2024-04-12 DIAGNOSIS — G8929 Other chronic pain: Secondary | ICD-10-CM

## 2024-04-12 DIAGNOSIS — R2689 Other abnormalities of gait and mobility: Secondary | ICD-10-CM

## 2024-04-12 DIAGNOSIS — M25561 Pain in right knee: Secondary | ICD-10-CM | POA: Diagnosis not present

## 2024-04-12 DIAGNOSIS — M25661 Stiffness of right knee, not elsewhere classified: Secondary | ICD-10-CM | POA: Diagnosis not present

## 2024-04-12 DIAGNOSIS — M6281 Muscle weakness (generalized): Secondary | ICD-10-CM | POA: Diagnosis not present

## 2024-04-12 DIAGNOSIS — R600 Localized edema: Secondary | ICD-10-CM

## 2024-04-12 NOTE — Therapy (Signed)
 OUTPATIENT PHYSICAL THERAPY TREATMENT NOTE  Patient Name: Daniel Hurley MRN: [id number] DOB:Sep 22, 1956, 68 y.o., male Today's Date: 04/12/2024  END OF SESSION:  PT End of Session - 04/12/24 1346     Visit Number 12    Number of Visits 18    Date for PT Re-Evaluation 04/28/24    Authorization Type [employer] Medicare 10% co-insurance    Authorization Time Period 03/15/2024 - 05/17/2024    Authorization - Visit Number 12    Authorization - Number of Visits 18    Progress Note Due on Visit 20    PT Start Time 1345    PT Stop Time 1425    PT Time Calculation (min) 40 min    Activity Tolerance Patient tolerated treatment well;No increased pain;Patient limited by pain    Behavior During Therapy [place] for tasks assessed/performed                Past Medical History:  Diagnosis Date   COPD (chronic obstructive pulmonary disease) (HCC)    Coronary artery disease    Hypertension 02/12/2020   Past Surgical History:  Procedure Laterality Date   ANKLE FUSION  2006   BACK SURGERY  2008   L5-5 fusion    CORONARY PRESSURE/FFR STUDY N/A 07/08/2022   Procedure: INTRAVASCULAR PRESSURE WIRE/FFR STUDY;  Surgeon: Odie Benne, MD;  Location: [facility];  Service: Cardiovascular;  Laterality: N/A;   CORONARY STENT INTERVENTION N/A 07/08/2022   Procedure: CORONARY STENT INTERVENTION;  Surgeon: Odie Benne, MD;  Location: [facility];  Service: Cardiovascular;  Laterality: N/A;   INGUINAL HERNIA REPAIR Right 12/27/2023   Procedure: OPEN RIGHT INGUINAL HERNIA REPAIR WITH MESH;  Surgeon: Enid Harry, MD;  Location: [facility] OR;  Service: General;  Laterality: Right;   KNEE CLOSED REDUCTION Right 03/14/2024   Procedure: CLOSED RIGHT KNEE MANIPULATION;  Surgeon: Jasmine Mesi, MD;  Location: [facility] OR;  Service: Orthopedics;  Laterality: Right;   LEFT HEART CATH AND CORONARY ANGIOGRAPHY N/A 07/08/2022   Procedure: LEFT HEART CATH AND CORONARY ANGIOGRAPHY;  Surgeon:  Odie Benne, MD;  Location: [facility];  Service: Cardiovascular;  Laterality: N/A;   left wrist surgery     TOTAL KNEE ARTHROPLASTY Right 01/27/2024   Procedure: RIGHT TOTAL KNEE ARTHROPLASTY;  Surgeon: Jasmine Mesi, MD;  Location: [facility] OR;  Service: Orthopedics;  Laterality: Right;   Patient Active Problem List   Diagnosis Date Noted   Arthrofibrosis of knee joint, right 03/26/2024   Arthritis of right knee 01/30/2024   S/P total knee replacement, right 01/27/2024   Unstable angina (HCC)    Chronic neck pain 11/22/2020   Hypertension 02/12/2020   Acute respiratory failure with hypoxia (HCC) 01/20/2016   Chest pain 01/19/2016   COPD exacerbation (HCC) 01/19/2016   Tobacco abuse 01/19/2016    PCP: Rey Catholic, MD  REFERRING PROVIDER: Casilda Clayman, PA-C   REFERRING DIAG: [phone] (ICD-10-CM) - S/P total knee arthroplasty, right   THERAPY DIAG:  Stiffness of right knee, not elsewhere classified  Chronic pain of right knee  Muscle weakness (generalized)  Other abnormalities of gait and mobility  Localized edema  Rationale for Evaluation and Treatment: Rehabilitation  ONSET DATE: 01/26/24 Right TKA & 03/14/2024 manipulation  SUBJECTIVE:   SUBJECTIVE STATEMENT: Knee is still painful after catching it on lawnmower.  Dr Rozelle Corning ordered more Oxy & muscle relaxor (pharmacy was out until today).      PERTINENT HISTORY: 01/26/24 Right TKA &  03/14/2024 manipulation, COPD, CAD w/stent, HTN, ankle fusion, back sg L5 fusion,   PAIN:  NPRS scale:  at rest  0/10 and trying to flex 4-5/10 (but took pain pill prior to PT) Pain location: Rt knee  Pain description: sore/tight Aggravating factors: end range pains.  Relieving factors: ice   PRECAUTIONS: None  WEIGHT BEARING RESTRICTIONS: No  FALLS:  Has patient fallen in last 6 months? No  LIVING ENVIRONMENT: Lives with: lives with their spouse and adult grandson Lives in: Mobile home Stairs: Yes:  External: 5 steps; can reach both Has following equipment at home: Single point cane, Environmental consultant - 2 wheeled, Crutches, Wheelchair (manual), [employer], bed side commode, Grab bars, and Ramped entry  OCCUPATION: retired Music therapist  PLOF: Independent  PATIENT GOALS:    walk & bend knee  Next MD visit:  OBJECTIVE:  DIAGNOSTIC FINDINGS: 02/09/2024 X-ray AP and lateral views of the knee reviewed.  Total knee arthroplasty  prosthesis in good position and alignment without any complicating features.  There is no evidence of dislocation, periprosthetic fracture, patella   Patient-Specific Activity Scoring Scheme  "0" represents "unable to perform." "10" represents "able to perform at prior level. 0 1 2 3 4 5 6 7 8 9  10 (Date and Score)   Activity Eval  03/15/2024    1. Walking   5    2. Stairs   4    3. Standing ADLs 6   4.    5.    Score 5    Total score = sum of the activity scores/number of activities Minimum detectable change (90%CI) for average score = 2 points Minimum detectable change (90%CI) for single activity score = 3 points  COGNITION: 03/15/2024 Overall cognitive status: WFL    SENSATION: 03/15/2024 WFL  EDEMA:  03/15/2024 Circumferential:   LLE: above knee 38 cm,  around knee 37.3 cm, below knee 34.5 cm RLE: above knee 42 cm,  around knee 42.8 cm, below knee 36.8 cm  POSTURE:  03/15/2024 rounded shoulders, forward head, and weight shift left  PALPATION: 03/15/2024 Right knee: Tenderness along incision especially distally over tibia with some adherence, patella and anterior joint line. Pt denies muscle pain in quad, hamstring or gastroc.   LOWER EXTREMITY ROM:   ROM Right Eval 03/15/2024 Left eval Rt 03/24/2024 Rt 03/27/24 Right 04/05/24 Right 04/10/24  Hip flexion        Hip extension        Hip abduction        Hip adduction        Hip internal rotation        Hip external rotation        Knee flexion Seated  P: 82* A: 85* Supine A: 71* P: 79* A: 85 P:  100 Active 92 AA: 90 A: 82 Seated A: 90* Seated P: 95*  Knee extension LAQ -22* Standing -18* A: -12 (seated LAQ) P: -7 Active lacks 6  A: -6 (Lacking) Standing TKE -7* LAQ -9* Standing  TKE -6* LAQ -8*  Ankle dorsiflexion        Ankle plantarflexion        Ankle inversion        Ankle eversion         (Blank rows = not tested)  LOWER EXTREMITY MMT:  MMT Right Eval 03/15/2024 Left Eval 03/15/2024  Hip flexion    Hip extension    Hip abduction    Hip adduction    Hip internal rotation  Hip external rotation    Knee flexion    Knee extension HH dynameter 32.1# 30.2# RLE:LLE 59% HH dynameter 52.9# 53.3#  Ankle dorsiflexion    Ankle plantarflexion    Ankle inversion    Ankle eversion     (Blank rows = not tested)  FUNCTIONAL TESTS:  03/15/2024 18 inch chair transfer: requires UE assist and decreased RLE assist moving from extended position sliding back upon arising.   GAIT: 03/15/2024 Distance walked: 150' Assistive device utilized: Single point cane Level of assistance: SBA / cues for deviations / no balance issues noted Comments: Antalgic pattern with decreased R leg stance duration, right knee flexed in stance with limited increased flexion for swing, RLE abducted                 TODAY'S TREATMENT 04/12/2024  Therapeutic Exercise: Sci fit L1 seat 9 with BLEs & BUEs for 8 min. Prone knee hang 30 sec then prone knee flexion 10 reps & quad set 10 reps; 30 sec hang  Bridge BLEs 3 reps 5 sets moving feet closer with each set Seated LAQ with contralateral LE knee flexion 10 reps  Therapeutic Activities: Shuttle double leg press 100# 2 sets x 10 reps focus on both ext & flex Shuttle single leg press 50# x 10 reps 2 sets Tandem stance RLE in front 1st rep & in back 2nd rep; 1st set on floor eyes open, 2nd set on floor eyes closed with frequent touch, 3rd set on foam pad eyes open with intermittent touch.  Slider to 3 target cones (ant-lat, lat, post-lat) with stance  knee flexion on outward and ext on inward motion; 5 reps each LE.   Manual: STM & MFR quads Grade II to III patellar mobilizations distal and proximally for knee flex/ext Grade II to III tibiofemoral mobs for flexion and ext  Pt declined vaso  TREATMENT 04/10/2024 Therapeutic Exercise: Sci fit L1 seat 9 with BLEs & BUEs for 8 min. Supine with RLE over edge quad stretch with strap 3x30" Prone knee hang 30 sec then prone knee flexion 10 reps & quad set 10 reps; 30 sec hang   Therapeutic Activities: Shuttle double leg press 100# 2x10 focus on both ext & flex Shuttle single leg press 50# x10  Manual: STM & MFR quads Grade II to III patellar mobilizations distal and proximally for knee flex/ext Grade II to III tibiofemoral mobs for flexion and ext  Pt declined vaso  TREATMENT 04/07/2024 Therapeutic Exercise: Sci fit L1; 2 min fwd seat 9, 2 min seat 8  Quadruped rock fwd/bwd for knee flexion x2 min Prone quad stretch with strap 2x30" Prone knee hang with quad set 2x10 Prone hip ext 2x10 Prone hip abd green TB 2x10  Therapeutic Activities: Shuttle double leg press 100# 2x10 focus on both ext & flex with strap for MWM Shuttle single leg press 50# x10  Manual: STM & MFR quads Grade II to III patellar mobilizations distal and proximally for knee flex/ext Grade II to III tibiofemoral mobs for flexion and ext    PATIENT EDUCATION:  03/20/2024 Education details: HEP update Person educated: Patient Education method: Programmer, multimedia, Demonstration, Verbal cues, and Handouts Education comprehension: verbalized understanding, returned demonstration, and verbal cues required  HOME EXERCISE PROGRAM: Access Code: [email or web address] URL: [email or web address] Date: 04/12/2024 Prepared by: Lorie Rook  Exercises - Supine Heel Slide with Strap  - 2-4 x daily - 7 x weekly - 1-2 sets - 10 reps - 5 seconds hold - supine quad  set with towel roll under ankle  - 2-4 x daily - 7 x  weekly - 1-2 sets - 10 reps - 5 seconds hold - supine knee flexion stretch with thigh vertical  - 2-4 x daily - 7 x weekly - 1-2 sets - 10 reps - 5 seconds hold - Seated Knee Extension Stretch with Chair  - 2-4 x daily - 7 x weekly - 1 sets - 1 reps - 3-5 mins hold - Seated Long Arc Quad  - 3-5 x daily - 7 x weekly - 1-2 sets - 10 reps - 2 hold - Seated Quad Set  - 3-5 x daily - 7 x weekly - 1 sets - 10 reps - 5 hold - Seated Knee Flexion AAROM  - 2-3 x daily - 7 x weekly - 1 sets - 5 reps - 10-15 hold - Gastroc Stretch on Step  - 1-2 x daily - 7 x weekly - 1 sets - 3 reps - 30 seconds hold - Seated Hamstring Stretch with Strap  - 1-2 x daily - 7 x weekly - 1 sets - 3 reps - 30 seconds hold - Supine Quadriceps Stretch with Strap on Table  - 1-2 x daily - 7 x weekly - 1 sets - 3 reps - 30 seconds hold  ASSESSMENT: CLINICAL IMPRESSION: Patient is limited by pain & edema in knee. He reports knee loosens up with PT but then is tight especially the next morning.     Pt continues to benefit from skilled PT.    OBJECTIVE IMPAIRMENTS: Abnormal gait, decreased activity tolerance, decreased balance, decreased knowledge of condition, decreased knowledge of use of DME, decreased mobility, difficulty walking, decreased ROM, decreased strength, increased edema, impaired flexibility, and pain.   ACTIVITY LIMITATIONS: carrying, lifting, sitting, standing, squatting, sleeping, stairs, transfers, and locomotion level  PARTICIPATION LIMITATIONS: laundry, driving, and community activity  PERSONAL FACTORS: Fitness, Time since onset of injury/illness/exacerbation, and 3+ comorbidities: see PMH are also affecting patient's functional outcome.   REHAB POTENTIAL: Good  CLINICAL DECISION MAKING: Stable/uncomplicated  EVALUATION COMPLEXITY: Low   GOALS: Goals reviewed with patient? Yes  SHORT TERM GOALS: (target date for Short term goals 04/07/2024)   1.  Patient will demonstrate independent use of home  exercise program to maintain progress from in clinic treatments. Baseline: See objective data Goal status:  MET   04/05/2024  2. PROM right Knee -5* ext to 90* flexion Baseline: See objective data Goal status: Partially met 03/27/2024  LONG TERM GOALS: (target dates for all long term goals  04/28/2024 )   1. Patient will demonstrate/report pain at worst less than or equal to 2/10 to facilitate minimal limitation in daily activity secondary to pain symptoms. Baseline: See objective data Goal status: Ongoing    04/10/2024   2. Patient will demonstrate independent use of home exercise program to facilitate ability to maintain/progress functional gains from skilled physical therapy services. Baseline: See objective data Goal status: Ongoing   04/10/2024  3.  Patient reports Patient-Specific Activity Score improved the average >/= 8 to indicate improvement in functional activities.  Baseline: SEE OBJECTIVE DATA Goal status: Ongoing   04/10/2024   4.  Patient will demonstrate right knee LE MMT 5/5 (within 75% of LLE) throughout to faciltiate usual transfers, stairs, squatting at [facility] for daily life.  Baseline: See objective data Goal status: Ongoing   04/10/2024   5.  PROM right knee -3* ext to 100* flexion Baseline: See objective data Goal status: Ongoing  04/10/2024   6.  AROM right knee standing ext -5* to supine/seated flexion 90* Baseline: See objective data Goal status: Ongoing   04/10/2024   PLAN:  PT FREQUENCY:  3-5x/week for first 2 weeks post Manipulation, then 2x/wk for 4 weeks  PT DURATION: 7 weeks  PLANNED INTERVENTIONS: 97164- PT Re-evaluation, 97750- Physical Performance Testing, 97110-Therapeutic exercises, 97530- Therapeutic activity, 97112- Neuromuscular re-education, 97535- Self Care, 40981- Manual therapy, [phone]- Gait training, [phone]- Electrical stimulation (unattended), [phone]- Electrical stimulation (manual), [id number]- Fluidotherapy, Patient/Family education, Balance training,  Stair training, Taping, Dry Needling, Joint mobilization, Scar mobilization, and DME instructions  PLAN FOR NEXT SESSION:    instruct in updated HEP muscle stretches,   AROM emphasis with edema control and quadriceps strengthening    Lorie Rook, PT, DPT 04/12/2024, 2:33 PM      Date of referral: 03/08/2024 Referring provider: Casilda Clayman, PA-C  Referring diagnosis? M57.846 (ICD-10-CM) - S/P total knee arthroplasty, right  Treatment diagnosis? (if different than referring diagnosis)  Stiffness of right knee, not elsewhere classified M25.661  Chronic pain of right knee M25.561, G89.29  Muscle weakness (generalized) M62.81  Other abnormalities of gait and mobility R26.89  Localized edema R60.0  What was this (referring dx) caused by? Surgery (Type: TKA)  Lonne Roan of Condition: Initial Onset (within last 3 months)   Laterality: Rt  Current Functional Measure Score: Patient Specific Functional Scale 5/10  Objective measurements identify impairments when they are compared to normal values, the uninvolved extremity, and prior level of function.  [x]  Yes  []  No  Objective assessment of functional ability: Moderate functional limitations   Briefly describe symptoms: right knee range limiting ADLs & gait, pain in right knee  How did symptoms start: surgery  Average pain intensity:  Last 24 hours: 3-10/10  Past week: 3-10/10  How often does the pt experience symptoms? Constantly  How much have the symptoms interfered with usual daily activities? Quite a bit  How has condition changed since care began at this facility? NA - initial visit  In general, how is the patients overall health? Very Good   BACK PAIN (STarT Back Screening Tool) No

## 2024-04-14 ENCOUNTER — Encounter: Payer: Self-pay | Admitting: Physical Therapy

## 2024-04-14 ENCOUNTER — Ambulatory Visit: Admitting: Physical Therapy

## 2024-04-14 DIAGNOSIS — R2689 Other abnormalities of gait and mobility: Secondary | ICD-10-CM

## 2024-04-14 DIAGNOSIS — M6281 Muscle weakness (generalized): Secondary | ICD-10-CM

## 2024-04-14 DIAGNOSIS — M25661 Stiffness of right knee, not elsewhere classified: Secondary | ICD-10-CM

## 2024-04-14 DIAGNOSIS — M25561 Pain in right knee: Secondary | ICD-10-CM

## 2024-04-14 DIAGNOSIS — R600 Localized edema: Secondary | ICD-10-CM

## 2024-04-14 DIAGNOSIS — G8929 Other chronic pain: Secondary | ICD-10-CM

## 2024-04-14 NOTE — Therapy (Signed)
 OUTPATIENT PHYSICAL THERAPY TREATMENT NOTE  Patient Name: Daniel Hurley MRN: [id number] DOB:[birth year], 68 y.o., male Today's Date: 04/14/2024  END OF SESSION:  PT End of Session - 04/14/24 1254     Visit Number 13    Number of Visits 18    Date for PT Re-Evaluation 04/28/24    Authorization Type [employer] Medicare 10% co-insurance    Authorization Time Period 03/15/2024 - 05/17/2024    Authorization - Number of Visits 18    Progress Note Due on Visit 20    PT Start Time 1300    PT Stop Time 1340    PT Time Calculation (min) 40 min    Activity Tolerance Patient tolerated treatment well;No increased pain;Patient limited by pain    Behavior During Therapy [facility] for tasks assessed/performed             Past Medical History:  Diagnosis Date   COPD (chronic obstructive pulmonary disease) (HCC)    Coronary artery disease    Hypertension 02/12/2020   Past Surgical History:  Procedure Laterality Date   ANKLE FUSION  2006   BACK SURGERY  2008   L5-5 fusion    CORONARY PRESSURE/FFR STUDY N/A 07/08/2022   Procedure: INTRAVASCULAR PRESSURE WIRE/FFR STUDY;  Surgeon: Odie Benne, MD;  Location: [facility] INVASIVE CV LAB;  Service: Cardiovascular;  Laterality: N/A;   CORONARY STENT INTERVENTION N/A 07/08/2022   Procedure: CORONARY STENT INTERVENTION;  Surgeon: Odie Benne, MD;  Location: [facility] INVASIVE CV [facility];  Service: Cardiovascular;  Laterality: N/A;   INGUINAL HERNIA REPAIR Right 12/27/2023   Procedure: OPEN RIGHT INGUINAL HERNIA REPAIR WITH MESH;  Surgeon: Enid Harry, MD;  Location: [facility] OR;  Service: General;  Laterality: Right;   KNEE CLOSED REDUCTION Right 03/14/2024   Procedure: CLOSED RIGHT KNEE MANIPULATION;  Surgeon: Jasmine Mesi, MD;  Location: [facility] OR;  Service: Orthopedics;  Laterality: Right;   LEFT HEART CATH AND CORONARY ANGIOGRAPHY N/A 07/08/2022   Procedure: LEFT HEART CATH AND CORONARY ANGIOGRAPHY;  Surgeon: Odie Benne, MD;  Location: [facility]  INVASIVE CV LAB;  Service: Cardiovascular;  Laterality: N/A;   left wrist surgery     TOTAL KNEE ARTHROPLASTY Right 01/27/2024   Procedure: RIGHT TOTAL KNEE ARTHROPLASTY;  Surgeon: Jasmine Mesi, MD;  Location[facility];  Service: Orthopedics;  Laterality: Right;   Patient Active Problem List   Diagnosis Date Noted   Arthrofibrosis of knee joint, right 03/26/2024   Arthritis of right knee 01/30/2024   S/P total knee replacement, right 01/27/2024   Unstable angina (HCC)    Chronic neck pain 11/22/2020   Hypertension 02/12/2020   Acute respiratory failure with hypoxia (HCC) 01/20/2016   Chest pain 01/19/2016   COPD exacerbation (HCC) 01/19/2016   Tobacco abuse 01/19/2016    PCP: Rey Catholic, MD  REFERRING PROVIDER: Casilda Clayman, PA-C   REFERRING DIAG: [phone] (ICD-10-CM) - S/P total knee arthroplasty, right   THERAPY DIAG:  Stiffness of right knee, not elsewhere classified  Chronic pain of right knee  Muscle weakness (generalized)  Other abnormalities of gait and mobility  Localized edema  Rationale for Evaluation and Treatment: Rehabilitation  ONSET DATE: 01/26/24 Right TKA & 03/14/2024 manipulation  SUBJECTIVE:   SUBJECTIVE STATEMENT: Pt states his knee is feeling really tight today. "It feels good for a bit after I leave therapy but then it just tightens back up."   PERTINENT HISTORY: 01/26/24 Right TKA & 03/14/2024 manipulation, COPD, CAD w/stent, HTN, ankle fusion, back sg L5  fusion,   PAIN:  NPRS scale:  at rest  0/10 and trying to flex 4-5/10 (but took pain pill prior to PT) Pain location: Rt knee  Pain description: sore/tight Aggravating factors: end range pains.  Relieving factors: ice   PRECAUTIONS: None  WEIGHT BEARING RESTRICTIONS: No  FALLS:  Has patient fallen in last 6 months? No  LIVING ENVIRONMENT: Lives with: lives with their spouse and adult grandson Lives in: Mobile home Stairs: Yes: External: 5 steps; can reach both Has  following equipment at home: Single point cane, Environmental consultant - 2 wheeled, Crutches, Wheelchair (manual), [employer], bed side commode, Grab bars, and Ramped entry  OCCUPATION: retired Music therapist  PLOF: Independent  PATIENT GOALS:    walk & bend knee  Next MD visit:  OBJECTIVE:  DIAGNOSTIC FINDINGS: 02/09/2024 X-ray AP and lateral views of the knee reviewed.  Total knee arthroplasty  prosthesis in good position and alignment without any complicating features.  There is no evidence of dislocation, periprosthetic fracture, patella   Patient-Specific Activity Scoring Scheme  "0" represents "unable to perform." "10" represents "able to perform at prior level. 0 1 2 3 4 5 6 7 8 9  10 (Date and Score)   Activity Eval  03/15/2024    1. Walking   5    2. Stairs   4    3. Standing ADLs 6   4.    5.    Score 5    Total score = sum of the activity scores/number of activities Minimum detectable change (90%CI) for average score = 2 points Minimum detectable change (90%CI) for single activity score = 3 points  COGNITION: 03/15/2024 Overall cognitive status: WFL    SENSATION: 03/15/2024 WFL  EDEMA:  03/15/2024 Circumferential:   LLE: above knee 38 cm,  around knee 37.3 cm, below knee 34.5 cm RLE: above knee 42 cm,  around knee 42.8 cm, below knee 36.8 cm  POSTURE:  03/15/2024 rounded shoulders, forward head, and weight shift left  PALPATION: 03/15/2024 Right knee: Tenderness along incision especially distally over tibia with some adherence, patella and anterior joint line. Pt denies muscle pain in quad, hamstring or gastroc.   LOWER EXTREMITY ROM:   ROM Right Eval 03/15/2024 Left eval Rt 03/24/2024 Rt 03/27/24 Right 04/05/24 Right 04/10/24  Hip flexion        Hip extension        Hip abduction        Hip adduction        Hip internal rotation        Hip external rotation        Knee flexion Seated  P: 82* A: 85* Supine A: 71* P: 79* A: 85 P: 100 Active 92 AA: 90 A: 82 Seated A:  90* Seated P: 95*  Knee extension LAQ -22* Standing -18* A: -12 (seated LAQ) P: -7 Active lacks 6  A: -6 (Lacking) Standing TKE -7* LAQ -9* Standing  TKE -6* LAQ -8*  Ankle dorsiflexion        Ankle plantarflexion        Ankle inversion        Ankle eversion         (Blank rows = not tested)  LOWER EXTREMITY MMT:  MMT Right Eval 03/15/2024 Left Eval 03/15/2024  Hip flexion    Hip extension    Hip abduction    Hip adduction    Hip internal rotation    Hip external rotation    Knee flexion  Knee extension HH dynameter 32.1# 30.2# RLE:LLE 59% HH dynameter 52.9# 53.3#  Ankle dorsiflexion    Ankle plantarflexion    Ankle inversion    Ankle eversion     (Blank rows = not tested)  FUNCTIONAL TESTS:  03/15/2024 18 inch chair transfer: requires UE assist and decreased RLE assist moving from extended position sliding back upon arising.   GAIT: 03/15/2024 Distance walked: 150' Assistive device utilized: Single point cane Level of assistance: SBA / cues for deviations / no balance issues noted Comments: Antalgic pattern with decreased R leg stance duration, right knee flexed in stance with limited increased flexion for swing, RLE abducted                 TODAY'S TREATMENT 04/14/2024  Therapeutic Exercise: Sci fit L1 seat 10 with BLEs & BUEs for 2 min, seat 9 for 4 min Supine ITB stretch with strap 2x30" Supine hamstring stretch with strap x30" Supine quad set 2x10 with strap to stretch gastroc Attempted knee flexion machine but increased lateral knee pain when coming into extension  Therapeutic Activity: Sit<>stand with knee bent at 90 deg flexion Leg press double leg 75# 2x10, x10 with PT overpressure into extension  Manual: STM & MFR quads  IASTM quads with massage roller Grade II to III patellar mobilizations distal and proximally for knee flex/ext Grade II to III tibiofemoral mobs for flexion  Vaso: R knee, 10 min, medium pressure,  elevated   TREATMENT 04/12/2024  Therapeutic Exercise: Sci fit L1 seat 9 with BLEs & BUEs for 8 min. Prone knee hang 30 sec then prone knee flexion 10 reps & quad set 10 reps; 30 sec hang  Bridge BLEs 3 reps 5 sets moving feet closer with each set Seated LAQ with contralateral LE knee flexion 10 reps  Therapeutic Activities: Shuttle double leg press 100# 2 sets x 10 reps focus on both ext & flex Shuttle single leg press 50# x 10 reps 2 sets Tandem stance RLE in front 1st rep & in back 2nd rep; 1st set on floor eyes open, 2nd set on floor eyes closed with frequent touch, 3rd set on foam pad eyes open with intermittent touch.  Slider to 3 target cones (ant-lat, lat, post-lat) with stance knee flexion on outward and ext on inward motion; 5 reps each LE.   Manual: STM & MFR quads Grade II to III patellar mobilizations distal and proximally for knee flex/ext Grade II to III tibiofemoral mobs for flexion and ext  Pt declined vaso  TREATMENT 04/10/2024 Therapeutic Exercise: Sci fit L1 seat 9 with BLEs & BUEs for 8 min. Supine with RLE over edge quad stretch with strap 3x30" Prone knee hang 30 sec then prone knee flexion 10 reps & quad set 10 reps; 30 sec hang   Therapeutic Activities: Shuttle double leg press 100# 2x10 focus on both ext & flex Shuttle single leg press 50# x10  Manual: STM & MFR quads Grade II to III patellar mobilizations distal and proximally for knee flex/ext Grade II to III tibiofemoral mobs for flexion and ext  Pt declined vaso  TREATMENT 04/07/2024 Therapeutic Exercise: Sci fit L1; 2 min fwd seat 9, 2 min seat 8  Quadruped rock fwd/bwd for knee flexion x2 min Prone quad stretch with strap 2x30" Prone knee hang with quad set 2x10 Prone hip ext 2x10 Prone hip abd green TB 2x10  Therapeutic Activities: Shuttle double leg press 100# 2x10 focus on both ext & flex with strap for MWM  Shuttle single leg press 50# x10  Manual: STM & MFR quads Grade II to III  patellar mobilizations distal and proximally for knee flex/ext Grade II to III tibiofemoral mobs for flexion and ext    PATIENT EDUCATION:  03/20/2024 Education details: HEP update Person educated: Patient Education method: Programmer, multimedia, Demonstration, Verbal cues, and Handouts Education comprehension: verbalized understanding, returned demonstration, and verbal cues required  HOME EXERCISE PROGRAM: Access Code: [email or web address] URL: [email or web address] Date: 04/12/2024 Prepared by: Lorie Rook  Exercises - Supine Heel Slide with Strap  - 2-4 x daily - 7 x weekly - 1-2 sets - 10 reps - 5 seconds hold - supine quad set with towel roll under ankle  - 2-4 x daily - 7 x weekly - 1-2 sets - 10 reps - 5 seconds hold - supine knee flexion stretch with thigh vertical  - 2-4 x daily - 7 x weekly - 1-2 sets - 10 reps - 5 seconds hold - Seated Knee Extension Stretch with Chair  - 2-4 x daily - 7 x weekly - 1 sets - 1 reps - 3-5 mins hold - Seated Long Arc Quad  - 3-5 x daily - 7 x weekly - 1-2 sets - 10 reps - 2 hold - Seated Quad Set  - 3-5 x daily - 7 x weekly - 1 sets - 10 reps - 5 hold - Seated Knee Flexion AAROM  - 2-3 x daily - 7 x weekly - 1 sets - 5 reps - 10-15 hold - Gastroc Stretch on Step  - 1-2 x daily - 7 x weekly - 1 sets - 3 reps - 30 seconds hold - Seated Hamstring Stretch with Strap  - 1-2 x daily - 7 x weekly - 1 sets - 3 reps - 30 seconds hold - Supine Quadriceps Stretch with Strap on Table  - 1-2 x daily - 7 x weekly - 1 sets - 3 reps - 30 seconds hold  ASSESSMENT: CLINICAL IMPRESSION: Pt with report of increased knee stiffness and pain today -- difficulty even obtaining full revolutions at seat 9. Provided manual work to address his joint stiffness and pt's feelings tightness along his quad. Discussed possibility of dry needling. Some increased pain during eccentric extension when attempting leg curl machine.     OBJECTIVE IMPAIRMENTS: Abnormal gait, decreased  activity tolerance, decreased balance, decreased knowledge of condition, decreased knowledge of use of DME, decreased mobility, difficulty walking, decreased ROM, decreased strength, increased edema, impaired flexibility, and pain.     GOALS: Goals reviewed with patient? Yes  SHORT TERM GOALS: (target date for Short term goals 04/07/2024)   1.  Patient will demonstrate independent use of home exercise program to maintain progress from in clinic treatments. Baseline: See objective data Goal status:  MET   04/05/2024  2. PROM right Knee -5* ext to 90* flexion Baseline: See objective data Goal status: Partially met 03/27/2024  LONG TERM GOALS: (target dates for all long term goals  04/28/2024 )   1. Patient will demonstrate/report pain at worst less than or equal to 2/10 to facilitate minimal limitation in daily activity secondary to pain symptoms. Baseline: See objective data Goal status: Ongoing    04/10/2024   2. Patient will demonstrate independent use of home exercise program to facilitate ability to maintain/progress functional gains from skilled physical therapy services. Baseline: See objective data Goal status: Ongoing   04/10/2024  3.  Patient reports Patient-Specific Activity Score improved the average >/= 8 to indicate improvement in functional  activities.  Baseline: SEE OBJECTIVE DATA Goal status: Ongoing   04/10/2024   4.  Patient will demonstrate right knee LE MMT 5/5 (within 75% of LLE) throughout to faciltiate usual transfers, stairs, squatting at [facility] for daily life.  Baseline: See objective data Goal status: Ongoing   04/10/2024   5.  PROM right knee -3* ext to 100* flexion Baseline: See objective data Goal status: Ongoing   04/10/2024   6.  AROM right knee standing ext -5* to supine/seated flexion 90* Baseline: See objective data Goal status: Ongoing   04/10/2024   PLAN:  PT FREQUENCY:  3-5x/week for first 2 weeks post Manipulation, then 2x/wk for 4 weeks  PT  DURATION: 7 weeks  PLANNED INTERVENTIONS: 97164- PT Re-evaluation, 97750- Physical Performance Testing, 97110-Therapeutic exercises, 97530- Therapeutic activity, 97112- Neuromuscular re-education, 97535- Self Care, 78295- Manual therapy, [phone]- Gait training, [phone]- Electrical stimulation (unattended), [phone]- Electrical stimulation (manual), [id number]- Fluidotherapy, Patient/Family education, Balance training, Stair training, Taping, Dry Needling, Joint mobilization, Scar mobilization, and DME instructions  PLAN FOR NEXT SESSION:    instruct in updated HEP muscle stretches,   AROM emphasis with edema control and quadriceps strengthening    [place] Marchitto April Ma L Dorrien Grunder, PT, DPT 04/14/2024, 12:54 PM      Date of referral: 03/08/2024 Referring provider: Casilda Clayman, PA-C  Referring diagnosis? X52.841 (ICD-10-CM) - S/P total knee arthroplasty, right  Treatment diagnosis? (if different than referring diagnosis)  Stiffness of right knee, not elsewhere classified M25.661  Chronic pain of right knee M25.561, G89.29  Muscle weakness (generalized) M62.81  Other abnormalities of gait and mobility R26.89  Localized edema R60.0  What was this (referring dx) caused by? Surgery (Type: TKA)  Lonne Roan of Condition: Initial Onset (within last 3 months)   Laterality: Rt  Current Functional Measure Score: Patient Specific Functional Scale 5/10  Objective measurements identify impairments when they are compared to normal values, the uninvolved extremity, and prior level of function.  [x]  Yes  []  No  Objective assessment of functional ability: Moderate functional limitations   Briefly describe symptoms: right knee range limiting ADLs & gait, pain in right knee  How did symptoms start: surgery  Average pain intensity:  Last 24 hours: 3-10/10  Past week: 3-10/10  How often does the pt experience symptoms? Constantly  How much have the symptoms interfered with usual daily activities? Quite a  bit  How has condition changed since care began at this facility? NA - initial visit  In general, how is the patients overall health? Very Good   BACK PAIN (STarT Back Screening Tool) No

## 2024-04-26 ENCOUNTER — Telehealth: Payer: Self-pay | Admitting: Orthopedic Surgery

## 2024-04-26 ENCOUNTER — Encounter: Payer: Self-pay | Admitting: Physical Therapy

## 2024-04-26 ENCOUNTER — Ambulatory Visit (INDEPENDENT_AMBULATORY_CARE_PROVIDER_SITE_OTHER): Admitting: Physical Therapy

## 2024-04-26 DIAGNOSIS — G8929 Other chronic pain: Secondary | ICD-10-CM

## 2024-04-26 DIAGNOSIS — R2689 Other abnormalities of gait and mobility: Secondary | ICD-10-CM | POA: Diagnosis not present

## 2024-04-26 DIAGNOSIS — M6281 Muscle weakness (generalized): Secondary | ICD-10-CM | POA: Diagnosis not present

## 2024-04-26 DIAGNOSIS — M25661 Stiffness of right knee, not elsewhere classified: Secondary | ICD-10-CM

## 2024-04-26 DIAGNOSIS — M25561 Pain in right knee: Secondary | ICD-10-CM

## 2024-04-26 DIAGNOSIS — R600 Localized edema: Secondary | ICD-10-CM

## 2024-04-26 NOTE — Telephone Encounter (Signed)
 I went and saw patient in therapy. He is now scheduled with [facility] for tomorrow at 2:30 pm per [place]. Disregard this message Lauren. Thanks.

## 2024-04-26 NOTE — Therapy (Signed)
 OUTPATIENT PHYSICAL THERAPY TREATMENT NOTE  Patient Name: Daniel Hurley MRN: [id number] DOB:[birth year], 68 y.o., male Today's Date: 04/26/2024  END OF SESSION:  PT End of Session - 04/26/24 1103     Visit Number 14    Number of Visits 18    Date for PT Re-Evaluation 04/28/24    Authorization Type [employer] Medicare 10% co-insurance    Authorization Time Period 03/15/2024 - 05/17/2024    Authorization - Visit Number 14    Authorization - Number of Visits 18    Progress Note Due on Visit 20    PT Start Time 1103    PT Stop Time 1141    PT Time Calculation (min) 38 min    Activity Tolerance Patient tolerated treatment well;No increased pain;Patient limited by pain    Behavior During Therapy [facility] for tasks assessed/performed           Past Medical History:  Diagnosis Date   COPD (chronic obstructive pulmonary disease) (HCC)    Coronary artery disease    Hypertension 02/12/2020   Past Surgical History:  Procedure Laterality Date   ANKLE FUSION  2006   BACK SURGERY  2008   L5-5 fusion    CORONARY PRESSURE/FFR STUDY N/A 07/08/2022   Procedure: INTRAVASCULAR PRESSURE WIRE/FFR STUDY;  Surgeon: Odie Benne, MD;  Location: [facility];  Service: Cardiovascular;  Laterality: N/A;   CORONARY STENT INTERVENTION N/A 07/08/2022   Procedure: CORONARY STENT INTERVENTION;  Surgeon: Odie Benne, MD;  Location: [facility];  Service: Cardiovascular;  Laterality: N/A;   INGUINAL HERNIA REPAIR Right 12/27/2023   Procedure: OPEN RIGHT INGUINAL HERNIA REPAIR WITH MESH;  Surgeon: Enid Harry, MD;  Location[facility] OR;  Service: General;  Laterality: Right;   KNEE CLOSED REDUCTION Right 03/14/2024   Procedure: CLOSED RIGHT KNEE MANIPULATION;  Surgeon: Jasmine Mesi, MD;  Location: [facility] OR;  Service: Orthopedics;  Laterality: Right;   LEFT HEART CATH AND CORONARY ANGIOGRAPHY N/A 07/08/2022   Procedure: LEFT HEART CATH AND CORONARY ANGIOGRAPHY;  Surgeon: Odie Benne, MD;  Location: [facility];  Service: Cardiovascular;  Laterality: N/A;   left wrist surgery     TOTAL KNEE ARTHROPLASTY Right 01/27/2024   Procedure: RIGHT TOTAL KNEE ARTHROPLASTY;  Surgeon: Jasmine Mesi, MD;  Location: [facility] OR;  Service: Orthopedics;  Laterality: Right;   Patient Active Problem List   Diagnosis Date Noted   Arthrofibrosis of knee joint, right 03/26/2024   Arthritis of right knee 01/30/2024   S/P total knee replacement, right 01/27/2024   Unstable angina (HCC)    Chronic neck pain 11/22/2020   Hypertension 02/12/2020   Acute respiratory failure with hypoxia (HCC) 01/20/2016   Chest pain 01/19/2016   COPD exacerbation (HCC) 01/19/2016   Tobacco abuse 01/19/2016    PCP: Rey Catholic, MD  REFERRING PROVIDER: Casilda Clayman, PA-C   REFERRING DIAG: [phone] (ICD-10-CM) - S/P total knee arthroplasty, right   THERAPY DIAG:  Stiffness of right knee, not elsewhere classified  Chronic pain of right knee  Muscle weakness (generalized)  Other abnormalities of gait and mobility  Localized edema  Rationale for Evaluation and Treatment: Rehabilitation  ONSET DATE: 01/26/24 Right TKA & 03/14/2024 manipulation  SUBJECTIVE:   SUBJECTIVE STATEMENT: His knee is swollen and hurting worse with inability to bend the knee.  He almost tripped and fell 4 times since last PT session.   PERTINENT HISTORY: 01/26/24 Right TKA & 03/14/2024 manipulation, COPD, CAD w/stent, HTN,  ankle fusion, back sg L5 fusion,   PAIN:  NPRS scale:  at rest 0-1/10, walking / standing 1/10 and trying to flex up to 8/10  Pain location: Rt knee  Pain description: sore/tight Aggravating factors: end range pains.  Relieving factors: ice   PRECAUTIONS: None  WEIGHT BEARING RESTRICTIONS: No  FALLS:  Has patient fallen in last 6 months? No  LIVING ENVIRONMENT: Lives with: lives with their spouse and adult grandson Lives in: Mobile home Stairs: Yes: External: 5  steps; can reach both Has following equipment at home: Single point cane, Environmental consultant - 2 wheeled, Crutches, Wheelchair (manual), [employer], bed side commode, Grab bars, and Ramped entry  OCCUPATION: retired Music therapist  PLOF: Independent  PATIENT GOALS:    walk & bend knee  Next MD visit:    OBJECTIVE:  DIAGNOSTIC FINDINGS: 02/09/2024 X-ray AP and lateral views of the knee reviewed.  Total knee arthroplasty  prosthesis in good position and alignment without any complicating features.  There is no evidence of dislocation, periprosthetic fracture, patella   Patient-Specific Activity Scoring Scheme  0 represents "unable to perform." 10 represents "able to perform at prior level. 0 1 2 3 4 5 6 7 8 9  10 (Date and Score)   Activity Eval  03/15/2024    1. Walking   5    2. Stairs   4    3. Standing ADLs 6   4.    5.    Score 5    Total score = sum of the activity scores/number of activities Minimum detectable change (90%CI) for average score = 2 points Minimum detectable change (90%CI) for single activity score = 3 points  COGNITION: 03/15/2024 Overall cognitive status: WFL    SENSATION: 03/15/2024 WFL  EDEMA:  04/26/2024: Circumferential  RLE: above knee 41.4 cm,  around knee 40 cm, below knee 34.8 cm  03/15/2024 Circumferential:   LLE: above knee 38 cm,  around knee 37.3 cm, below knee 34.5 cm RLE: above knee 42 cm,  around knee 42.8 cm, below knee 36.8 cm  POSTURE:  03/15/2024 rounded shoulders, forward head, and weight shift left  PALPATION: 04/26/2024: Pain and muscle tightness noted along ITB midpoint to distal knee, vastus lateralis and lateral hamstrings. PT palpated quad tendon and patella tendon with quadriceps activation with no noted concerns. Patient reports significant pain at tibial plateau laterally.  03/15/2024 Right knee: Tenderness along incision especially distally over tibia with some adherence, patella and anterior joint line. Pt denies muscle pain in  quad, hamstring or gastroc.   LOWER EXTREMITY ROM:   ROM Right Eval 03/15/2024 Left eval Rt 03/24/2024 Rt 03/27/24 Right 04/05/24 Right 04/10/24 Right 04/26/24  Hip flexion         Hip extension         Hip abduction         Hip adduction         Hip internal rotation         Hip external rotation         Knee flexion Seated  P: 82* A: 85* Supine A: 71* P: 79* A: 85 P: 100 Active 92 AA: 90 A: 82 Seated A: 90* Seated P: 95* Pain Limiting Seated A: 78*  Knee extension LAQ -22* Standing -18* A: -12 (seated LAQ) P: -7 Active lacks 6  A: -6 (Lacking) Standing TKE -7* LAQ -9* Standing  TKE -6* LAQ -8* LAQ -12*  Ankle dorsiflexion         Ankle  plantarflexion         Ankle inversion         Ankle eversion          (Blank rows = not tested)  LOWER EXTREMITY MMT:  MMT Right Eval 03/15/2024 Left Eval 03/15/2024  Hip flexion    Hip extension    Hip abduction    Hip adduction    Hip internal rotation    Hip external rotation    Knee flexion    Knee extension HH dynameter 32.1# 30.2# RLE:LLE 59% HH dynameter 52.9# 53.3#  Ankle dorsiflexion    Ankle plantarflexion    Ankle inversion    Ankle eversion     (Blank rows = not tested)  FUNCTIONAL TESTS:  03/15/2024 18 inch chair transfer: requires UE assist and decreased RLE assist moving from extended position sliding back upon arising.   GAIT: 03/15/2024 Distance walked: 150' Assistive device utilized: Single point cane Level of assistance: SBA / cues for deviations / no balance issues noted Comments: Antalgic pattern with decreased R leg stance duration, right knee flexed in stance with limited increased flexion for swing, RLE abducted                 TODAY'S TREATMENT 04/26/2024  Therapeutic Exercise: LAQ and active knee flexion seated 10 reps.  Patient significantly limited by acute knee pain SAQ 10 reps 2 sets. Supine heel slides with lower leg on 12 inch ball and strap assisted.  Manual: STM and instrument  assisted soft tissue mobilization to ITB, lateral quad and lateral hamstrings. Ice massage to anterior & lateral knee and lateral thigh    TREATMENT 04/14/2024  Therapeutic Exercise: Sci fit L1 seat 10 with BLEs & BUEs for 2 min, seat 9 for 4 min Supine ITB stretch with strap 2x30 Supine hamstring stretch with strap x30 Supine quad set 2x10 with strap to stretch gastroc Attempted knee flexion machine but increased lateral knee pain when coming into extension  Therapeutic Activity: Sit<>stand with knee bent at 90 deg flexion Leg press double leg 75# 2x10, x10 with PT overpressure into extension  Manual: STM & MFR quads  IASTM quads with massage roller Grade II to III patellar mobilizations distal and proximally for knee flex/ext Grade II to III tibiofemoral mobs for flexion  Vaso: R knee, 10 min, medium pressure, elevated   TREATMENT 04/12/2024  Therapeutic Exercise: Sci fit L1 seat 9 with BLEs & BUEs for 8 min. Prone knee hang 30 sec then prone knee flexion 10 reps & quad set 10 reps; 30 sec hang  Bridge BLEs 3 reps 5 sets moving feet closer with each set Seated LAQ with contralateral LE knee flexion 10 reps  Therapeutic Activities: Shuttle double leg press 100# 2 sets x 10 reps focus on both ext & flex Shuttle single leg press 50# x 10 reps 2 sets Tandem stance RLE in front 1st rep & in back 2nd rep; 1st set on floor eyes open, 2nd set on floor eyes closed with frequent touch, 3rd set on foam pad eyes open with intermittent touch.  Slider to 3 target cones (ant-lat, lat, post-lat) with stance knee flexion on outward and ext on inward motion; 5 reps each LE.   Manual: STM & MFR quads Grade II to III patellar mobilizations distal and proximally for knee flex/ext Grade II to III tibiofemoral mobs for flexion and ext  Pt declined vaso   TREATMENT 04/10/2024 Therapeutic Exercise: Sci fit L1 seat 9 with BLEs & BUEs  for 8 min. Supine with RLE over edge quad stretch with  strap 3x30 Prone knee hang 30 sec then prone knee flexion 10 reps & quad set 10 reps; 30 sec hang   Therapeutic Activities: Shuttle double leg press 100# 2x10 focus on both ext & flex Shuttle single leg press 50# x10  Manual: STM & MFR quads Grade II to III patellar mobilizations distal and proximally for knee flex/ext Grade II to III tibiofemoral mobs for flexion and ext  Pt declined vaso    PATIENT EDUCATION:  03/20/2024 Education details: HEP update Person educated: Patient Education method: Programmer, multimedia, Demonstration, Verbal cues, and Handouts Education comprehension: verbalized understanding, returned demonstration, and verbal cues required  HOME EXERCISE PROGRAM: Access Code: [email or web address] URL: [email or web address] Date: 04/12/2024 Prepared by: Lorie Rook  Exercises - Supine Heel Slide with Strap  - 2-4 x daily - 7 x weekly - 1-2 sets - 10 reps - 5 seconds hold - supine quad set with towel roll under ankle  - 2-4 x daily - 7 x weekly - 1-2 sets - 10 reps - 5 seconds hold - supine knee flexion stretch with thigh vertical  - 2-4 x daily - 7 x weekly - 1-2 sets - 10 reps - 5 seconds hold - Seated Knee Extension Stretch with Chair  - 2-4 x daily - 7 x weekly - 1 sets - 1 reps - 3-5 mins hold - Seated Long Arc Quad  - 3-5 x daily - 7 x weekly - 1-2 sets - 10 reps - 2 hold - Seated Quad Set  - 3-5 x daily - 7 x weekly - 1 sets - 10 reps - 5 hold - Seated Knee Flexion AAROM  - 2-3 x daily - 7 x weekly - 1 sets - 5 reps - 10-15 hold - Gastroc Stretch on Step  - 1-2 x daily - 7 x weekly - 1 sets - 3 reps - 30 seconds hold - Seated Hamstring Stretch with Strap  - 1-2 x daily - 7 x weekly - 1 sets - 3 reps - 30 seconds hold - Supine Quadriceps Stretch with Strap on Table  - 1-2 x daily - 7 x weekly - 1 sets - 3 reps - 30 seconds hold  ASSESSMENT: CLINICAL IMPRESSION: Patient had significant increase in pain since the last PT session.  See objective data palpation  section. case manager for Ortho care clinic came to PT session per request and set up an appointment with the PA tomorrow to have this patient's knee reassessed.  PT performed extensive soft tissue mobilization to lateral thigh and knee area with slight decrease in pain.  Patient has significantly less range and increased edema noted in objective data.  OBJECTIVE IMPAIRMENTS: Abnormal gait, decreased activity tolerance, decreased balance, decreased knowledge of condition, decreased knowledge of use of DME, decreased mobility, difficulty walking, decreased ROM, decreased strength, increased edema, impaired flexibility, and pain.     GOALS: Goals reviewed with patient? Yes  SHORT TERM GOALS: (target date for Short term goals 04/07/2024)   1.  Patient will demonstrate independent use of home exercise program to maintain progress from in clinic treatments. Baseline: See objective data Goal status:  MET   04/05/2024  2. PROM right Knee -5* ext to 90* flexion Baseline: See objective data Goal status: Partially met 03/27/2024  LONG TERM GOALS: (target dates for all long term goals  04/28/2024 )   1. Patient will demonstrate/report pain at worst less than or equal to 2/10  to facilitate minimal limitation in daily activity secondary to pain symptoms. Baseline: See objective data Goal status: Ongoing    04/26/2024   2. Patient will demonstrate independent use of home exercise program to facilitate ability to maintain/progress functional gains from skilled physical therapy services. Baseline: See objective data Goal status: Ongoing   04/26/2024  3.  Patient reports Patient-Specific Activity Score improved the average >/= 8 to indicate improvement in functional activities.  Baseline: SEE OBJECTIVE DATA Goal status: Ongoing   04/26/2024   4.  Patient will demonstrate right knee LE MMT 5/5 (within 75% of LLE) throughout to faciltiate usual transfers, stairs, squatting at [facility] for daily life.  Baseline:  See objective data Goal status: Ongoing    04/26/2024   5.  PROM right knee -3* ext to 100* flexion Baseline: See objective data Goal status: Ongoing    04/26/2024   6.  AROM right knee standing ext -5* to supine/seated flexion 90* Baseline: See objective data Goal status: Ongoing   04/26/2024   PLAN:  PT FREQUENCY:  3-5x/week for first 2 weeks post Manipulation, then 2x/wk for 4 weeks  PT DURATION: 7 weeks  PLANNED INTERVENTIONS: 97164- PT Re-evaluation, 97750- Physical Performance Testing, 97110-Therapeutic exercises, 97530- Therapeutic activity, [id number]- Neuromuscular re-education, 97535- Self Care, 96295- Manual therapy, [phone]- Gait training, [phone]- Electrical stimulation (unattended), [phone]- Electrical stimulation (manual), [id number]- Fluidotherapy, Patient/Family education, Balance training, Stair training, Taping, Dry Needling, Joint mobilization, Scar mobilization, and DME instructions  PLAN FOR NEXT SESSION:   Check PA note and assess pain issues noted on 6/18 session.  Progress exercises and therapeutic activities for range and strength as tolerated.   Lorie Rook, PT, DPT 04/26/2024, 4:38 PM      Date of referral: 03/08/2024 Referring provider: Casilda Clayman, PA-C  Referring diagnosis? D66.440 (ICD-10-CM) - S/P total knee arthroplasty, right  Treatment diagnosis? (if different than referring diagnosis)  Stiffness of right knee, not elsewhere classified M25.661  Chronic pain of right knee M25.561, G89.29  Muscle weakness (generalized) M62.81  Other abnormalities of gait and mobility R26.89  Localized edema R60.0  What was this (referring dx) caused by? Surgery (Type: TKA)  Lonne Roan of Condition: Initial Onset (within last 3 months)   Laterality: Rt  Current Functional Measure Score: Patient Specific Functional Scale 5/10  Objective measurements identify impairments when they are compared to normal values, the uninvolved extremity, and prior level of  function.  [x]  Yes  []  No  Objective assessment of functional ability: Moderate functional limitations   Briefly describe symptoms: right knee range limiting ADLs & gait, pain in right knee  How did symptoms start: surgery  Average pain intensity:  Last 24 hours: 3-10/10  Past week: 3-10/10  How often does the pt experience symptoms? Constantly  How much have the symptoms interfered with usual daily activities? Quite a bit  How has condition changed since care began at this facility? NA - initial visit  In general, how is the patients overall health? Very Good   BACK PAIN (STarT Back Screening Tool) No

## 2024-04-26 NOTE — Telephone Encounter (Signed)
 Pt came into office seen that he does not have up coming post op appt. Please call tp we appt. Pt phone number is [phone].

## 2024-04-27 ENCOUNTER — Other Ambulatory Visit

## 2024-04-27 ENCOUNTER — Ambulatory Visit (INDEPENDENT_AMBULATORY_CARE_PROVIDER_SITE_OTHER): Admitting: Surgical

## 2024-04-27 DIAGNOSIS — Z96651 Presence of right artificial knee joint: Secondary | ICD-10-CM

## 2024-04-27 NOTE — Progress Notes (Unsigned)
syn

## 2024-04-28 ENCOUNTER — Encounter: Admitting: Physical Therapy

## 2024-04-30 ENCOUNTER — Encounter: Payer: Self-pay | Admitting: Surgical

## 2024-04-30 MED ORDER — LIDOCAINE HCL 1 % IJ SOLN
5.0000 mL | INTRAMUSCULAR | Status: AC | PRN
  Administered 2024-04-27: 5 mL

## 2024-04-30 MED ORDER — BUPIVACAINE HCL 0.25 % IJ SOLN
4.0000 mL | INTRAMUSCULAR | Status: AC | PRN
  Administered 2024-04-27: 4 mL via INTRA_ARTICULAR

## 2024-04-30 NOTE — Progress Notes (Signed)
 Post-Op Visit Note   Patient: Daniel Hurley           Date of Birth: 04-Apr-1956           MRN: [id number] Visit Date: 04/27/2024 PCP: Hughie Sharper, MD   Assessment & Plan:  Chief Complaint:  Chief Complaint  Patient presents with   Right Knee - Routine Post Op        RIGHT KNEE MUA (surgery 03-14-24)   Right TKA 01/27/24     Visit Diagnoses:  1. S/P total knee arthroplasty, right     Plan: Patient is a 68 year old male who presents s/p right total knee arthroplasty on 01/27/2024 with right knee manipulation under anesthesia on 03/14/2024.  Recently saw Dr. Addie about 3 weeks ago who felt he was progressing well but in the last 2 weeks he has had increased pain and decreased function.  States that he was doing leg press in PT and someone straighten his leg out to try and achieve more knee extension.  He had increased pain ever since this event with increased difficulty with ambulation and more swelling.  He has more stiffness.  Feels like it is marginally better today compared with about a week ago.  No fevers or chills.  Incision is still well-healed.  No sinus tract or drainage from the incision.  On exam, patient has 10 degrees extension passively and 85 degrees of knee flexion.  He has positive effusion.  No significant cellulitis and no sinus tract noted.  Incisions well-healed.  No calf tenderness.  Negative Homans' sign.  He does have multiple areas of tenderness around the knee diffusely with pain primarily around the patellar tendon region.  No pain with hip range of motion.  Able to perform straight leg raise without extensor lag.  Stable to varus and valgus stress at 0 and 30 degrees.  No gross mid flexion instability.  Patient has new radiographs taken today demonstrating no significant change in the appearance of the implants.  After discussion of options, plan to aspirate the right knee and inject Toradol.  Right knee was aspirated of 15 cc of nonpurulent synovial  fluid.  This fluid will be sent off for fluid analysis and culture.  Injection consisting of 4 cc of Marcaine  and 1 cc of Toradol was injected into the right knee to see if we can hit the reset button on any inflammatory process that is ongoing in the right knee joint.  Did have good symptomatic relief while walking out of the clinic.  Plan to call with aspiration results.    Procedure Note  Patient: Daniel Hurley             Date of Birth: [birth year]           MRN: [id number]             Visit Date: 04/27/2024  Procedures: Visit Diagnoses:  1. S/P total knee arthroplasty, right     Large Joint Inj: R knee on 04/27/2024 5:42 PM Indications: diagnostic evaluation, joint swelling and pain Details: 18 G 1.5 in needle, superolateral approach  Arthrogram: No  Medications: 5 mL lidocaine  1 %; 4 mL bupivacaine  0.25 % Aspirate: 15 mL yellow and blood-tinged Outcome: tolerated well, no immediate complications  Alcohol, Betadine , ChloraPrep, sterile gloves utilized during procedure.  1 cc Toradol mixed with 4 cc bupivacaine  Procedure, treatment alternatives, risks and benefits explained, specific risks discussed. Consent was given by the patient. Immediately prior to procedure a time  out was called to verify the correct patient, procedure, equipment, support staff and site/side marked as required. Patient was prepped and draped in the usual sterile fashion.         Follow-Up Instructions: No follow-ups on file.   Orders:  Orders Placed This Encounter  Procedures   Anaerobic and Aerobic Culture   XR KNEE 3 VIEW RIGHT   Synovial Fluid Analysis, Complete   No orders of the defined types were placed in this encounter.   Imaging: No results found.  PMFS History: Patient Active Problem List   Diagnosis Date Noted   Arthrofibrosis of knee joint, right 03/26/2024   Arthritis of right knee 01/30/2024   S/P total knee replacement, right 01/27/2024   Unstable angina (HCC)     Chronic neck pain 11/22/2020   Hypertension 02/12/2020   Acute respiratory failure with hypoxia (HCC) 01/20/2016   Chest pain 01/19/2016   COPD exacerbation (HCC) 01/19/2016   Tobacco abuse 01/19/2016   Past Medical History:  Diagnosis Date   COPD (chronic obstructive pulmonary disease) (HCC)    Coronary artery disease    Hypertension 02/12/2020    No family history on file.  Past Surgical History:  Procedure Laterality Date   ANKLE FUSION  2006   BACK SURGERY  2008   L5-5 fusion    CORONARY PRESSURE/FFR STUDY N/A 07/08/2022   Procedure: INTRAVASCULAR PRESSURE WIRE/FFR STUDY;  Surgeon: Verlin Lonni BIRCH, MD;  Location: [facility];  Service: Cardiovascular;  Laterality: N/A;   CORONARY STENT INTERVENTION N/A 07/08/2022   Procedure: CORONARY STENT INTERVENTION;  Surgeon: Verlin Lonni BIRCH, MD;  Location: [facility];  Service: Cardiovascular;  Laterality: N/A;   INGUINAL HERNIA REPAIR Right 12/27/2023   Procedure: OPEN RIGHT INGUINAL HERNIA REPAIR WITH MESH;  Surgeon: Ebbie Cough, MD;  Location: [facility] OR;  Service: General;  Laterality: Right;   KNEE CLOSED REDUCTION Right 03/14/2024   Procedure: CLOSED RIGHT KNEE MANIPULATION;  Surgeon: Addie Cordella Hamilton, MD;  Location: [facility] OR;  Service: Orthopedics;  Laterality: Right;   LEFT HEART CATH AND CORONARY ANGIOGRAPHY N/A 07/08/2022   Procedure: LEFT HEART CATH AND CORONARY ANGIOGRAPHY;  Surgeon: Verlin Lonni BIRCH, MD;  Location: [facility];  Service: Cardiovascular;  Laterality: N/A;   left wrist surgery     TOTAL KNEE ARTHROPLASTY Right 01/27/2024   Procedure: RIGHT TOTAL KNEE ARTHROPLASTY;  Surgeon: Addie Cordella Hamilton, MD;  Location: [facility] OR;  Service: Orthopedics;  Laterality: Right;   Social History   Occupational History   Not on file  Tobacco Use   Smoking status: Every Day    Current packs/day: 1.50    Types: Cigarettes   Smokeless tobacco: Never  Vaping Use   Vaping status: Never Used   Substance and Sexual Activity   Alcohol use: Not Currently    Comment: rarely   Drug use: No   Sexual activity: Not Currently

## 2024-05-03 ENCOUNTER — Ambulatory Visit: Payer: Self-pay | Admitting: Surgical

## 2024-05-03 ENCOUNTER — Encounter: Admitting: Physical Therapy

## 2024-05-03 LAB — ANAEROBIC AND AEROBIC CULTURE
AER RESULT:: NO GROWTH
MICRO NUMBER:: 16602356
MICRO NUMBER:: 16602357
SPECIMEN QUALITY:: ADEQUATE
SPECIMEN QUALITY:: ADEQUATE

## 2024-05-03 LAB — SYNOVIAL FLUID ANALYSIS, COMPLETE
Basophils, %: 0 %
Eosinophils-Synovial: 0 % (ref 0–2)
Lymphocytes-Synovial Fld: 2 % (ref 0–74)
Monocyte/Macrophage: 20 % (ref 0–69)
Neutrophil, Synovial: 78 % — ABNORMAL HIGH (ref 0–24)
Synoviocytes, %: 0 % (ref 0–15)
WBC, Synovial: 7847 {cells}/uL — ABNORMAL HIGH (ref <150)

## 2024-05-03 NOTE — Progress Notes (Signed)
 Daniel Hurley, can we have [name] come back 2 weeks from his last visit so that we can reaspirated his knee and send that off for fluid analysis?

## 2024-05-04 ENCOUNTER — Telehealth: Payer: Self-pay | Admitting: Surgical

## 2024-05-04 NOTE — Progress Notes (Signed)
 I left voicemail for patient and asked that he call back to schedule appointment with Herlene on 05/11/2024 to have his knee reaspirated.

## 2024-05-04 NOTE — Telephone Encounter (Signed)
 Patient wants to know if he should go to PT which he is scheduled for on 7/1/ & 7/3.

## 2024-05-05 ENCOUNTER — Encounter: Admitting: Physical Therapy

## 2024-05-05 NOTE — Telephone Encounter (Signed)
 I called patient to discuss fluid results.  He is okay to return to PT.  Feeling a lot better from a pain standpoint after aspiration and injection with Toradol.  We will aspirate in 1 more time on 7/3 to send this off her fluid analysis to make sure that the cell count is trending down.  Patient understands and agrees with plan.  Call with any concerns.

## 2024-05-09 ENCOUNTER — Ambulatory Visit (INDEPENDENT_AMBULATORY_CARE_PROVIDER_SITE_OTHER): Admitting: Physical Therapy

## 2024-05-09 ENCOUNTER — Encounter: Admitting: Physical Therapy

## 2024-05-09 ENCOUNTER — Encounter: Payer: Self-pay | Admitting: Physical Therapy

## 2024-05-09 DIAGNOSIS — M25561 Pain in right knee: Secondary | ICD-10-CM

## 2024-05-09 DIAGNOSIS — M25661 Stiffness of right knee, not elsewhere classified: Secondary | ICD-10-CM | POA: Diagnosis not present

## 2024-05-09 DIAGNOSIS — R2689 Other abnormalities of gait and mobility: Secondary | ICD-10-CM

## 2024-05-09 DIAGNOSIS — M6281 Muscle weakness (generalized): Secondary | ICD-10-CM

## 2024-05-09 DIAGNOSIS — R600 Localized edema: Secondary | ICD-10-CM

## 2024-05-09 DIAGNOSIS — G8929 Other chronic pain: Secondary | ICD-10-CM

## 2024-05-09 NOTE — Therapy (Signed)
 OUTPATIENT PHYSICAL THERAPY TREATMENT NOTE  Patient Name: Daniel Hurley MRN: [id number] DOB:02/18/1956, 68 y.o., male Today's Date: 05/09/2024  END OF SESSION:  PT End of Session - 05/09/24 1518     Visit Number 15    Number of Visits 18    Date for PT Re-Evaluation 06/06/24    Authorization Type [employer] Medicare 10% co-insurance    Authorization Time Period 03/15/2024 - 05/17/2024    Authorization - Visit Number 15    Authorization - Number of Visits 18    Progress Note Due on Visit 20    PT Start Time 1518    PT Stop Time 1600    PT Time Calculation (min) 42 min    Activity Tolerance Patient tolerated treatment well;Patient limited by pain    Behavior During Therapy [facility] for tasks assessed/performed           Past Medical History:  Diagnosis Date   COPD (chronic obstructive pulmonary disease) (HCC)    Coronary artery disease    Hypertension 02/12/2020   Past Surgical History:  Procedure Laterality Date   ANKLE FUSION  2006   BACK SURGERY  2008   L5-5 fusion    CORONARY PRESSURE/FFR STUDY N/A 07/08/2022   Procedure: INTRAVASCULAR PRESSURE WIRE/FFR STUDY;  Surgeon: Verlin Lonni BIRCH, MD;  Location: [facility];  Service: Cardiovascular;  Laterality: N/A;   CORONARY STENT INTERVENTION N/A 07/08/2022   Procedure: CORONARY STENT INTERVENTION;  Surgeon: Verlin Lonni BIRCH, MD;  Location: [facility];  Service: Cardiovascular;  Laterality: N/A;   INGUINAL HERNIA REPAIR Right 12/27/2023   Procedure: OPEN RIGHT INGUINAL HERNIA REPAIR WITH MESH;  Surgeon: Ebbie Cough, MD;  Location: [facility] OR;  Service: General;  Laterality: Right;   KNEE CLOSED REDUCTION Right 03/14/2024   Procedure: CLOSED RIGHT KNEE MANIPULATION;  Surgeon: Addie Cordella Hamilton, MD;  Location: [facility] OR;  Service: Orthopedics;  Laterality: Right;   LEFT HEART CATH AND CORONARY ANGIOGRAPHY N/A 07/08/2022   Procedure: LEFT HEART CATH AND CORONARY ANGIOGRAPHY;  Surgeon: Verlin Lonni BIRCH, MD;   Location: [facility];  Service: Cardiovascular;  Laterality: N/A;   left wrist surgery     TOTAL KNEE ARTHROPLASTY Right 01/27/2024   Procedure: RIGHT TOTAL KNEE ARTHROPLASTY;  Surgeon: Addie Cordella Hamilton, MD;  Location: [facility] OR;  Service: Orthopedics;  Laterality: Right;   Patient Active Problem List   Diagnosis Date Noted   Arthrofibrosis of knee joint, right 03/26/2024   Arthritis of right knee 01/30/2024   S/P total knee replacement, right 01/27/2024   Unstable angina (HCC)    Chronic neck pain 11/22/2020   Hypertension 02/12/2020   Acute respiratory failure with hypoxia (HCC) 01/20/2016   Chest pain 01/19/2016   COPD exacerbation (HCC) 01/19/2016   Tobacco abuse 01/19/2016    PCP: Hughie Sharper, MD  REFERRING PROVIDER: Shirly Carlin CROME, PA-C   REFERRING DIAG: [phone] (ICD-10-CM) - S/P total knee arthroplasty, right   THERAPY DIAG:  Stiffness of right knee, not elsewhere classified  Chronic pain of right knee  Muscle weakness (generalized)  Other abnormalities of gait and mobility  Localized edema  Rationale for Evaluation and Treatment: Rehabilitation  ONSET DATE: 01/26/24 Right TKA & 03/14/2024 manipulation  SUBJECTIVE:   SUBJECTIVE STATEMENT: Pt reports his knee has been feeling better after getting his knee drained. Pt states he was able to play golf without a problem today. Still feels he cannot bend it though. Will see ortho again on Thursday to drain more  and then get lab work to determine next steps. Pt reports no pain currently.   PERTINENT HISTORY: 01/26/24 Right TKA & 03/14/2024 manipulation, COPD, CAD w/stent, HTN, ankle fusion, back sg L5 fusion,   PAIN:  NPRS scale:  at rest 0-1/10, walking / standing 1/10 and trying to flex up to 8/10  Pain location: Rt knee  Pain description: sore/tight Aggravating factors: end range pains.  Relieving factors: ice   PRECAUTIONS: None  WEIGHT BEARING RESTRICTIONS: No  FALLS:  Has patient fallen in  last 6 months? No  LIVING ENVIRONMENT: Lives with: lives with their spouse and adult grandson Lives in: Mobile home Stairs: Yes: External: 5 steps; can reach both Has following equipment at home: Single point cane, Environmental consultant - 2 wheeled, Crutches, Wheelchair (manual), [employer], bed side commode, Grab bars, and Ramped entry  OCCUPATION: retired Music therapist  PLOF: Independent  PATIENT GOALS:    walk & bend knee  Next MD visit:    OBJECTIVE:  DIAGNOSTIC FINDINGS: 02/09/2024 X-ray AP and lateral views of the knee reviewed.  Total knee arthroplasty  prosthesis in good position and alignment without any complicating features.  There is no evidence of dislocation, periprosthetic fracture, patella   Patient-Specific Activity Scoring Scheme  0 represents "unable to perform." 10 represents "able to perform at prior level. 0 1 2 3 4 5 6 7 8 9  10 (Date and Score)   Activity Eval  03/15/2024    1. Walking   5    2. Stairs   4    3. Standing ADLs 6   4.    5.    Score 5    Total score = sum of the activity scores/number of activities Minimum detectable change (90%CI) for average score = 2 points Minimum detectable change (90%CI) for single activity score = 3 points  COGNITION: 03/15/2024 Overall cognitive status: WFL    SENSATION: 03/15/2024 WFL  EDEMA:  04/26/2024: Circumferential  RLE: above knee 41.4 cm,  around knee 40 cm, below knee 34.8 cm  03/15/2024 Circumferential:   LLE: above knee 38 cm,  around knee 37.3 cm, below knee 34.5 cm RLE: above knee 42 cm,  around knee 42.8 cm, below knee 36.8 cm  POSTURE:  03/15/2024 rounded shoulders, forward head, and weight shift left  PALPATION: 04/26/2024: Pain and muscle tightness noted along ITB midpoint to distal knee, vastus lateralis and lateral hamstrings. PT palpated quad tendon and patella tendon with quadriceps activation with no noted concerns. Patient reports significant pain at tibial plateau  laterally.  03/15/2024 Right knee: Tenderness along incision especially distally over tibia with some adherence, patella and anterior joint line. Pt denies muscle pain in quad, hamstring or gastroc.   LOWER EXTREMITY ROM:   ROM Right Eval 03/15/2024 Left eval Rt 03/24/2024 Rt 03/27/24 Right 04/05/24 Right 04/10/24 Right 04/26/24 Right 05/09/24  Hip flexion          Hip extension          Hip abduction          Hip adduction          Hip internal rotation          Hip external rotation          Knee flexion Seated  P: 82* A: 85* Supine A: 71* P: 79* A: 85 P: 100 Active 92 AA: 90 A: 82 Seated A: 90* Seated P: 95* Pain Limiting Seated A: 78* AAROM: 90  Knee extension LAQ -22* Standing -18*  A: -12 (seated LAQ) P: -7 Active lacks 6  A: -6 (Lacking) Standing TKE -7* LAQ -9* Standing  TKE -6* LAQ -8* LAQ -12* LAQ -8  Ankle dorsiflexion          Ankle plantarflexion          Ankle inversion          Ankle eversion           (Blank rows = not tested)  LOWER EXTREMITY MMT:  MMT Right Eval 03/15/2024 Left Eval 03/15/2024  Hip flexion    Hip extension    Hip abduction    Hip adduction    Hip internal rotation    Hip external rotation    Knee flexion    Knee extension HH dynameter 32.1# 30.2# RLE:LLE 59% HH dynameter 52.9# 53.3#  Ankle dorsiflexion    Ankle plantarflexion    Ankle inversion    Ankle eversion     (Blank rows = not tested)  FUNCTIONAL TESTS:  03/15/2024 18 inch chair transfer: requires UE assist and decreased RLE assist moving from extended position sliding back upon arising.   GAIT: 03/15/2024 Distance walked: 150' Assistive device utilized: Single point cane Level of assistance: SBA / cues for deviations / no balance issues noted Comments: Antalgic pattern with decreased R leg stance duration, right knee flexed in stance with limited increased flexion for swing, RLE abducted                TODAY'S TREATMENT 04/26/2024  Therapeutic  Exercise: Nustep L5 x 10 min LEs intermittently pushed forward Seated knee flexion self stretch elevated 2x10 Seated hamstring stretch with strap Seated LAQ eccentrics 3x10 Standing gastroc stretch on slant board x30 Rechecked ROM Therapeutic Activity: Leg press double leg 87# 2x10, 100# 2x10 Deferred vaso today  TODAY'S TREATMENT 04/26/2024  Therapeutic Exercise: LAQ and active knee flexion seated 10 reps.  Patient significantly limited by acute knee pain SAQ 10 reps 2 sets. Supine heel slides with lower leg on 12 inch ball and strap assisted.  Manual: STM and instrument assisted soft tissue mobilization to ITB, lateral quad and lateral hamstrings. Ice massage to anterior & lateral knee and lateral thigh    TREATMENT 04/14/2024  Therapeutic Exercise: Sci fit L1 seat 10 with BLEs & BUEs for 2 min, seat 9 for 4 min Supine ITB stretch with strap 2x30 Supine hamstring stretch with strap x30 Supine quad set 2x10 with strap to stretch gastroc Attempted knee flexion machine but increased lateral knee pain when coming into extension  Therapeutic Activity: Sit<>stand with knee bent at 90 deg flexion Leg press double leg 75# 2x10, x10 with PT overpressure into extension  Manual: STM & MFR quads  IASTM quads with massage roller Grade II to III patellar mobilizations distal and proximally for knee flex/ext Grade II to III tibiofemoral mobs for flexion  Vaso: R knee, 10 min, medium pressure, elevated   TREATMENT 04/12/2024  Therapeutic Exercise: Sci fit L1 seat 9 with BLEs & BUEs for 8 min. Prone knee hang 30 sec then prone knee flexion 10 reps & quad set 10 reps; 30 sec hang  Bridge BLEs 3 reps 5 sets moving feet closer with each set Seated LAQ with contralateral LE knee flexion 10 reps  Therapeutic Activities: Shuttle double leg press 100# 2 sets x 10 reps focus on both ext & flex Shuttle single leg press 50# x 10 reps 2 sets Tandem stance RLE in front 1st rep & in back  2nd  rep; 1st set on floor eyes open, 2nd set on floor eyes closed with frequent touch, 3rd set on foam pad eyes open with intermittent touch.  Slider to 3 target cones (ant-lat, lat, post-lat) with stance knee flexion on outward and ext on inward motion; 5 reps each LE.   Manual: STM & MFR quads Grade II to III patellar mobilizations distal and proximally for knee flex/ext Grade II to III tibiofemoral mobs for flexion and ext  Pt declined vaso   TREATMENT 04/10/2024 Therapeutic Exercise: Sci fit L1 seat 9 with BLEs & BUEs for 8 min. Supine with RLE over edge quad stretch with strap 3x30 Prone knee hang 30 sec then prone knee flexion 10 reps & quad set 10 reps; 30 sec hang   Therapeutic Activities: Shuttle double leg press 100# 2x10 focus on both ext & flex Shuttle single leg press 50# x10  Manual: STM & MFR quads Grade II to III patellar mobilizations distal and proximally for knee flex/ext Grade II to III tibiofemoral mobs for flexion and ext  Pt declined vaso    PATIENT EDUCATION:  03/20/2024 Education details: HEP update Person educated: Patient Education method: Programmer, multimedia, Demonstration, Verbal cues, and Handouts Education comprehension: verbalized understanding, returned demonstration, and verbal cues required  HOME EXERCISE PROGRAM: Access Code: [email or web address] URL: [email or web address] Date: 04/12/2024 Prepared by: Grayce Spatz  Exercises - Supine Heel Slide with Strap  - 2-4 x daily - 7 x weekly - 1-2 sets - 10 reps - 5 seconds hold - supine quad set with towel roll under ankle  - 2-4 x daily - 7 x weekly - 1-2 sets - 10 reps - 5 seconds hold - supine knee flexion stretch with thigh vertical  - 2-4 x daily - 7 x weekly - 1-2 sets - 10 reps - 5 seconds hold - Seated Knee Extension Stretch with Chair  - 2-4 x daily - 7 x weekly - 1 sets - 1 reps - 3-5 mins hold - Seated Long Arc Quad  - 3-5 x daily - 7 x weekly - 1-2 sets - 10 reps - 2 hold - Seated  Quad Set  - 3-5 x daily - 7 x weekly - 1 sets - 10 reps - 5 hold - Seated Knee Flexion AAROM  - 2-3 x daily - 7 x weekly - 1 sets - 5 reps - 10-15 hold - Gastroc Stretch on Step  - 1-2 x daily - 7 x weekly - 1 sets - 3 reps - 30 seconds hold - Seated Hamstring Stretch with Strap  - 1-2 x daily - 7 x weekly - 1 sets - 3 reps - 30 seconds hold - Supine Quadriceps Stretch with Strap on Table  - 1-2 x daily - 7 x weekly - 1 sets - 3 reps - 30 seconds hold  ASSESSMENT: CLINICAL IMPRESSION: Much less pain this session. Able to tolerate return to progressive knee ROM -- range is appearing closer to what he has had in prior sessions. Some posterior calf and knee tightness addressed with calf stretching. Worked on eccentric quad strengthening. At this point, pt is able to do his recreational activity (golfing) but still unable to squat down to pick up his golf ball. Will extend PT POC for 6 more weeks pending on ortho recommendations after drawing more fluid and resending lab work. Still has 3 more visits left for his [employer] auth until 05/17/24  OBJECTIVE IMPAIRMENTS: Abnormal gait, decreased activity tolerance, decreased balance, decreased knowledge of condition, decreased  knowledge of use of DME, decreased mobility, difficulty walking, decreased ROM, decreased strength, increased edema, impaired flexibility, and pain.     GOALS: Goals reviewed with patient? Yes  SHORT TERM GOALS: (target date for Short term goals 04/07/2024)   1.  Patient will demonstrate independent use of home exercise program to maintain progress from in clinic treatments. Baseline: See objective data Goal status:  MET   04/05/2024  2. PROM right Knee -5* ext to 90* flexion Baseline: See objective data Goal status: Partially met 03/27/2024  LONG TERM GOALS: (target dates for all long term goals  06/20/2024)   1. Patient will demonstrate/report pain at worst less than or equal to 2/10 to facilitate minimal limitation in daily activity  secondary to pain symptoms. Baseline: See objective data Goal status: PARTIALLY MET 05/09/24 - 0 today; however, has had fluid drawn and pain had been >2/10    2. Patient will demonstrate independent use of home exercise program to facilitate ability to maintain/progress functional gains from skilled physical therapy services. Baseline: See objective data Goal status:MET 05/09/24 - patient has returned to golfing  3.  Patient reports Patient-Specific Activity Score improved the average >/= 8 to indicate improvement in functional activities.  Baseline: SEE OBJECTIVE DATA Goal status: Ongoing  05/09/24   4.  Patient will demonstrate right knee LE MMT 5/5 (within 75% of LLE) throughout to faciltiate usual transfers, stairs, squatting at [facility] for daily life.  Baseline: See objective data Goal status: Ongoing  05/09/24 -- Leg press 100 lbs on R   5.  PROM right knee -3* ext to 100* flexion Baseline: See objective data Goal status: Ongoing  05/09/24    6.  AROM right knee standing ext -5* to supine/seated flexion 90* Baseline: See objective data Goal status: Ongoing 05/09/24   PLAN:  PT FREQUENCY:  2x/wk   PT DURATION: 6 weeks pending on ortho recs  PLANNED INTERVENTIONS: 97164- PT Re-evaluation, 97750- Physical Performance Testing, 97110-Therapeutic exercises, 97530- Therapeutic activity, [id number]- Neuromuscular re-education, 97535- Self Care, [id number]- Manual therapy, [phone]- Gait training, [phone]- Electrical stimulation (unattended), [phone]- Electrical stimulation (manual), [id number]- Fluidotherapy, Patient/Family education, Balance training, Stair training, Taping, Dry Needling, Joint mobilization, Scar mobilization, and DME instructions  PLAN FOR NEXT SESSION:   Check PA note and assess pain issues noted on 6/18 session.  Progress exercises and therapeutic activities for range and strength as tolerated.   Mirren Gest April Ma L Monik Lins, PT, DPT 05/09/2024, 3:18 PM      Date of referral: 03/08/2024 Referring  provider: Shirly Carlin CROME, PA-C  Referring diagnosis? S03.348 (ICD-10-CM) - S/P total knee arthroplasty, right  Treatment diagnosis? (if different than referring diagnosis)  Stiffness of right knee, not elsewhere classified M25.661  Chronic pain of right knee M25.561, G89.29  Muscle weakness (generalized) M62.81  Other abnormalities of gait and mobility R26.89  Localized edema R60.0  What was this (referring dx) caused by? Surgery (Type: TKA)  Lysle of Condition: Initial Onset (within last 3 months)   Laterality: Rt  Current Functional Measure Score: Patient Specific Functional Scale 5/10  Objective measurements identify impairments when they are compared to normal values, the uninvolved extremity, and prior level of function.  [x]  Yes  []  No  Objective assessment of functional ability: Moderate functional limitations   Briefly describe symptoms: right knee range limiting ADLs & gait, pain in right knee  How did symptoms start: surgery  Average pain intensity:  Last 24 hours: 3-10/10  Past week: 3-10/10  How often does the  pt experience symptoms? Constantly  How much have the symptoms interfered with usual daily activities? Quite a bit  How has condition changed since care began at this facility? NA - initial visit  In general, how is the patients overall health? Very Good   BACK PAIN (STarT Back Screening Tool) No

## 2024-05-11 ENCOUNTER — Ambulatory Visit (INDEPENDENT_AMBULATORY_CARE_PROVIDER_SITE_OTHER): Admitting: Physical Therapy

## 2024-05-11 ENCOUNTER — Encounter: Payer: Self-pay | Admitting: Physical Therapy

## 2024-05-11 ENCOUNTER — Ambulatory Visit: Admitting: Surgical

## 2024-05-11 DIAGNOSIS — R600 Localized edema: Secondary | ICD-10-CM

## 2024-05-11 DIAGNOSIS — M25561 Pain in right knee: Secondary | ICD-10-CM

## 2024-05-11 DIAGNOSIS — M25661 Stiffness of right knee, not elsewhere classified: Secondary | ICD-10-CM

## 2024-05-11 DIAGNOSIS — M6281 Muscle weakness (generalized): Secondary | ICD-10-CM | POA: Diagnosis not present

## 2024-05-11 DIAGNOSIS — R2689 Other abnormalities of gait and mobility: Secondary | ICD-10-CM | POA: Diagnosis not present

## 2024-05-11 DIAGNOSIS — G8929 Other chronic pain: Secondary | ICD-10-CM

## 2024-05-11 NOTE — Therapy (Addendum)
 OUTPATIENT PHYSICAL THERAPY TREATMENT NOTE / DISCHARGE  Patient Name: Daniel Hurley MRN: [id number] DOB:25-May-1956, 68 y.o., male Today's Date: 05/11/2024  END OF SESSION:  PT End of Session - 05/11/24 1104     Visit Number 16    Number of Visits 18    Date for PT Re-Evaluation 06/20/24    Authorization Type [employer] Medicare 10% co-insurance    Authorization Time Period 03/15/2024 - 05/17/2024    Authorization - Visit Number 16    Authorization - Number of Visits 18    Progress Note Due on Visit 20    PT Start Time 1102    PT Stop Time 1140    PT Time Calculation (min) 38 min    Activity Tolerance Patient tolerated treatment well;Patient limited by pain    Behavior During Therapy [facility] for tasks assessed/performed            Past Medical History:  Diagnosis Date   COPD (chronic obstructive pulmonary disease) (HCC)    Coronary artery disease    Hypertension 02/12/2020   Past Surgical History:  Procedure Laterality Date   ANKLE FUSION  2006   BACK SURGERY  2008   L5-5 fusion    CORONARY PRESSURE/FFR STUDY N/A 07/08/2022   Procedure: INTRAVASCULAR PRESSURE WIRE/FFR STUDY;  Surgeon: Verlin Lonni BIRCH, MD;  Location: [facility];  Service: Cardiovascular;  Laterality: N/A;   CORONARY STENT INTERVENTION N/A 07/08/2022   Procedure: CORONARY STENT INTERVENTION;  Surgeon: Verlin Lonni BIRCH, MD;  Location: [facility];  Service: Cardiovascular;  Laterality: N/A;   INGUINAL HERNIA REPAIR Right 12/27/2023   Procedure: OPEN RIGHT INGUINAL HERNIA REPAIR WITH MESH;  Surgeon: Ebbie Cough, MD;  [facility];  Service: General;  Laterality: Right;   KNEE CLOSED REDUCTION Right 03/14/2024   Procedure: CLOSED RIGHT KNEE MANIPULATION;  Surgeon: Addie Cordella Hamilton, MD;  Location: [facility] OR;  Service: Orthopedics;  Laterality: Right;   LEFT HEART CATH AND CORONARY ANGIOGRAPHY N/A 07/08/2022   Procedure: LEFT HEART CATH AND CORONARY ANGIOGRAPHY;  Surgeon: Verlin Lonni BIRCH, MD;  Location: [facility];  Service: Cardiovascular;  Laterality: N/A;   left wrist surgery     TOTAL KNEE ARTHROPLASTY Right 01/27/2024   Procedure: RIGHT TOTAL KNEE ARTHROPLASTY;  Surgeon: Addie Cordella Hamilton, MD;  Location: [facility] OR;  Service: Orthopedics;  Laterality: Right;   Patient Active Problem List   Diagnosis Date Noted   Arthrofibrosis of knee joint, right 03/26/2024   Arthritis of right knee 01/30/2024   S/P total knee replacement, right 01/27/2024   Unstable angina (HCC)    Chronic neck pain 11/22/2020   Hypertension 02/12/2020   Acute respiratory failure with hypoxia (HCC) 01/20/2016   Chest pain 01/19/2016   COPD exacerbation (HCC) 01/19/2016   Tobacco abuse 01/19/2016    PCP: Hughie Sharper, MD  REFERRING PROVIDER: Shirly Carlin CROME, PA-C   REFERRING DIAG: [phone] (ICD-10-CM) - S/P total knee arthroplasty, right   THERAPY DIAG:  Stiffness of right knee, not elsewhere classified  Chronic pain of right knee  Muscle weakness (generalized)  Other abnormalities of gait and mobility  Localized edema  Rationale for Evaluation and Treatment: Rehabilitation  ONSET DATE: 01/26/24 Right TKA & 03/14/2024 manipulation  SUBJECTIVE:   SUBJECTIVE STATEMENT: Pt reports he was to have an appointment with Herlene today but got canceled. No issues after last PT session. Pain has remained controlled.  PERTINENT HISTORY: 01/26/24 Right TKA & 03/14/2024 manipulation, COPD, CAD w/stent, HTN, ankle  fusion, back sg L5 fusion,   PAIN:  NPRS scale:  at rest 0-1/10, walking / standing 1/10 and trying to flex up to 8/10  Pain location: Rt knee  Pain description: sore/tight Aggravating factors: end range pains.  Relieving factors: ice   PRECAUTIONS: None  WEIGHT BEARING RESTRICTIONS: No  FALLS:  Has patient fallen in last 6 months? No  LIVING ENVIRONMENT: Lives with: lives with their spouse and adult grandson Lives in: Mobile home Stairs: Yes: External:  5 steps; can reach both Has following equipment at home: Single point cane, Environmental consultant - 2 wheeled, Crutches, Wheelchair (manual), [employer], bed side commode, Grab bars, and Ramped entry  OCCUPATION: retired Music therapist  PLOF: Independent  PATIENT GOALS:    walk & bend knee  Next MD visit:    OBJECTIVE:  DIAGNOSTIC FINDINGS: 02/09/2024 X-ray AP and lateral views of the knee reviewed.  Total knee arthroplasty  prosthesis in good position and alignment without any complicating features.  There is no evidence of dislocation, periprosthetic fracture, patella   Patient-Specific Activity Scoring Scheme  0 represents "unable to perform." 10 represents "able to perform at prior level. 0 1 2 3 4 5 6 7 8 9  10 (Date and Score)   Activity Eval  03/15/2024    1. Walking   5    2. Stairs   4    3. Standing ADLs 6   4.    5.    Score 5    Total score = sum of the activity scores/number of activities Minimum detectable change (90%CI) for average score = 2 points Minimum detectable change (90%CI) for single activity score = 3 points  COGNITION: 03/15/2024 Overall cognitive status: WFL    SENSATION: 03/15/2024 WFL  EDEMA:  04/26/2024: Circumferential  RLE: above knee 41.4 cm,  around knee 40 cm, below knee 34.8 cm  03/15/2024 Circumferential:   LLE: above knee 38 cm,  around knee 37.3 cm, below knee 34.5 cm RLE: above knee 42 cm,  around knee 42.8 cm, below knee 36.8 cm  POSTURE:  03/15/2024 rounded shoulders, forward head, and weight shift left  PALPATION: 04/26/2024: Pain and muscle tightness noted along ITB midpoint to distal knee, vastus lateralis and lateral hamstrings. PT palpated quad tendon and patella tendon with quadriceps activation with no noted concerns. Patient reports significant pain at tibial plateau laterally.  03/15/2024 Right knee: Tenderness along incision especially distally over tibia with some adherence, patella and anterior joint line. Pt denies muscle pain in  quad, hamstring or gastroc.   LOWER EXTREMITY ROM:   ROM Right Eval 03/15/2024 Left eval Rt 03/24/2024 Rt 03/27/24 Right 04/05/24 Right 04/10/24 Right 04/26/24 Right 05/09/24  Hip flexion          Hip extension          Hip abduction          Hip adduction          Hip internal rotation          Hip external rotation          Knee flexion Seated  P: 82* A: 85* Supine A: 71* P: 79* A: 85 P: 100 Active 92 AA: 90 A: 82 Seated A: 90* Seated P: 95* Pain Limiting Seated A: 78* AAROM: 90  Knee extension LAQ -22* Standing -18* A: -12 (seated LAQ) P: -7 Active lacks 6  A: -6 (Lacking) Standing TKE -7* LAQ -9* Standing  TKE -6* LAQ -8* LAQ -12* LAQ -8  Ankle dorsiflexion          Ankle plantarflexion          Ankle inversion          Ankle eversion           (Blank rows = not tested)  LOWER EXTREMITY MMT:  MMT Right Eval 03/15/2024 Left Eval 03/15/2024  Hip flexion    Hip extension    Hip abduction    Hip adduction    Hip internal rotation    Hip external rotation    Knee flexion    Knee extension HH dynameter 32.1# 30.2# RLE:LLE 59% HH dynameter 52.9# 53.3#  Ankle dorsiflexion    Ankle plantarflexion    Ankle inversion    Ankle eversion     (Blank rows = not tested)  FUNCTIONAL TESTS:  03/15/2024 18 inch chair transfer: requires UE assist and decreased RLE assist moving from extended position sliding back upon arising.   GAIT: 03/15/2024 Distance walked: 150' Assistive device utilized: Single point cane Level of assistance: SBA / cues for deviations / no balance issues noted Comments: Antalgic pattern with decreased R leg stance duration, right knee flexed in stance with limited increased flexion for swing, RLE abducted                TODAY'S TREATMENT 05/11/2024  Therapeutic Exercise: Nustep L8 x 10 min LEs intermittently pushed forward Seated knee flexion self stretch elevated 2x10 Standing gastroc stretch on slant board x30 Therapeutic  Activity: Runner's step up 2x10x3 on 6 step Side step up and over 6 step 2x10 Side step on airex beam x5 laps Tandem walk on airex beam x 5 laps Leg press double leg 100# 2x10, single leg 87# 3x10  Deferred vaso today   05/09/2024  Therapeutic Exercise: Nustep L5 x 10 min LEs intermittently pushed forward Seated knee flexion self stretch elevated 2x10 Seated hamstring stretch with strap Seated LAQ eccentrics 3x10 Standing gastroc stretch on slant board x30 Rechecked ROM Therapeutic Activity: Leg press double leg 87# 2x10, 100# 2x10 Deferred vaso today  TODAY'S TREATMENT 04/26/2024  Therapeutic Exercise: LAQ and active knee flexion seated 10 reps.  Patient significantly limited by acute knee pain SAQ 10 reps 2 sets. Supine heel slides with lower leg on 12 inch ball and strap assisted.  Manual: STM and instrument assisted soft tissue mobilization to ITB, lateral quad and lateral hamstrings. Ice massage to anterior & lateral knee and lateral thigh    TREATMENT 04/14/2024  Therapeutic Exercise: Sci fit L1 seat 10 with BLEs & BUEs for 2 min, seat 9 for 4 min Supine ITB stretch with strap 2x30 Supine hamstring stretch with strap x30 Supine quad set 2x10 with strap to stretch gastroc Attempted knee flexion machine but increased lateral knee pain when coming into extension  Therapeutic Activity: Sit<>stand with knee bent at 90 deg flexion Leg press double leg 75# 2x10, x10 with PT overpressure into extension  Manual: STM & MFR quads  IASTM quads with massage roller Grade II to III patellar mobilizations distal and proximally for knee flex/ext Grade II to III tibiofemoral mobs for flexion  Vaso: R knee, 10 min, medium pressure, elevated   TREATMENT 04/12/2024  Therapeutic Exercise: Sci fit L1 seat 9 with BLEs & BUEs for 8 min. Prone knee hang 30 sec then prone knee flexion 10 reps & quad set 10 reps; 30 sec hang  Bridge BLEs 3 reps 5 sets moving feet closer with  each set Seated LAQ with  contralateral LE knee flexion 10 reps  Therapeutic Activities: Shuttle double leg press 100# 2 sets x 10 reps focus on both ext & flex Shuttle single leg press 50# x 10 reps 2 sets Tandem stance RLE in front 1st rep & in back 2nd rep; 1st set on floor eyes open, 2nd set on floor eyes closed with frequent touch, 3rd set on foam pad eyes open with intermittent touch.  Slider to 3 target cones (ant-lat, lat, post-lat) with stance knee flexion on outward and ext on inward motion; 5 reps each LE.   Manual: STM & MFR quads Grade II to III patellar mobilizations distal and proximally for knee flex/ext Grade II to III tibiofemoral mobs for flexion and ext  Pt declined vaso    PATIENT EDUCATION:  03/20/2024 Education details: HEP update Person educated: Patient Education method: Programmer, multimedia, Demonstration, Verbal cues, and Handouts Education comprehension: verbalized understanding, returned demonstration, and verbal cues required  HOME EXERCISE PROGRAM: Access Code: [email or web address] URL: [email or web address] Date: 04/12/2024 Prepared by: Grayce Spatz  Exercises - Supine Heel Slide with Strap  - 2-4 x daily - 7 x weekly - 1-2 sets - 10 reps - 5 seconds hold - supine quad set with towel roll under ankle  - 2-4 x daily - 7 x weekly - 1-2 sets - 10 reps - 5 seconds hold - supine knee flexion stretch with thigh vertical  - 2-4 x daily - 7 x weekly - 1-2 sets - 10 reps - 5 seconds hold - Seated Knee Extension Stretch with Chair  - 2-4 x daily - 7 x weekly - 1 sets - 1 reps - 3-5 mins hold - Seated Long Arc Quad  - 3-5 x daily - 7 x weekly - 1-2 sets - 10 reps - 2 hold - Seated Quad Set  - 3-5 x daily - 7 x weekly - 1 sets - 10 reps - 5 hold - Seated Knee Flexion AAROM  - 2-3 x daily - 7 x weekly - 1 sets - 5 reps - 10-15 hold - Gastroc Stretch on Step  - 1-2 x daily - 7 x weekly - 1 sets - 3 reps - 30 seconds hold - Seated Hamstring Stretch with Strap  - 1-2 x  daily - 7 x weekly - 1 sets - 3 reps - 30 seconds hold - Supine Quadriceps Stretch with Strap on Table  - 1-2 x daily - 7 x weekly - 1 sets - 3 reps - 30 seconds hold  ASSESSMENT: CLINICAL IMPRESSION: Working on continued functional strengthening of R LE. Focused on knee eccentric control and balance this session with continued ROM as tolerated.   OBJECTIVE IMPAIRMENTS: Abnormal gait, decreased activity tolerance, decreased balance, decreased knowledge of condition, decreased knowledge of use of DME, decreased mobility, difficulty walking, decreased ROM, decreased strength, increased edema, impaired flexibility, and pain.     GOALS: Goals reviewed with patient? Yes  SHORT TERM GOALS: (target date for Short term goals 04/07/2024)   1.  Patient will demonstrate independent use of home exercise program to maintain progress from in clinic treatments. Baseline: See objective data Goal status:  MET   04/05/2024  2. PROM right Knee -5* ext to 90* flexion Baseline: See objective data Goal status: Partially met 03/27/2024  LONG TERM GOALS: (target dates for all long term goals  06/20/2024)   1. Patient will demonstrate/report pain at worst less than or equal to 2/10 to facilitate minimal limitation in daily activity secondary to pain symptoms.  Baseline: See objective data Goal status: PARTIALLY MET 05/09/24 - 0 today; however, has had fluid drawn and pain had been >2/10    2. Patient will demonstrate independent use of home exercise program to facilitate ability to maintain/progress functional gains from skilled physical therapy services. Baseline: See objective data Goal status:MET 05/09/24 - patient has returned to golfing  3.  Patient reports Patient-Specific Activity Score improved the average >/= 8 to indicate improvement in functional activities.  Baseline: SEE OBJECTIVE DATA Goal status: Ongoing  05/09/24   4.  Patient will demonstrate right knee LE MMT 5/5 (within 75% of LLE) throughout to  faciltiate usual transfers, stairs, squatting at [facility] for daily life.  Baseline: See objective data Goal status: Ongoing  05/09/24 -- Leg press 100 lbs on R   5.  PROM right knee -3* ext to 100* flexion Baseline: See objective data Goal status: Ongoing  05/09/24    6.  AROM right knee standing ext -5* to supine/seated flexion 90* Baseline: See objective data Goal status: Ongoing 05/09/24   PLAN:  PT FREQUENCY:  2x/wk   PT DURATION: 6 weeks pending on ortho recs  PLANNED INTERVENTIONS: 97164- PT Re-evaluation, 97750- Physical Performance Testing, 97110-Therapeutic exercises, 97530- Therapeutic activity, [id number]- Neuromuscular re-education, 97535- Self Care, 02859- Manual therapy, [phone]- Gait training, [phone]- Electrical stimulation (unattended), [phone]- Electrical stimulation (manual), [id number]- Fluidotherapy, Patient/Family education, Balance training, Stair training, Taping, Dry Needling, Joint mobilization, Scar mobilization, and DME instructions  PLAN FOR NEXT SESSION:   Check PA note and assess pain issues noted on 6/18 session.  Progress exercises and therapeutic activities for range and strength as tolerated.   Burech Mcfarland April Ma L Masashi Snowdon, PT, DPT 05/11/2024, 11:04 AM  PHYSICAL THERAPY DISCHARGE SUMMARY  Visits from Start of Care: 16  Current functional level related to goals / functional outcomes: See note   Remaining deficits: See note   Education / Equipment: HEP  Patient goals were partially met. Patient is being discharged due to not returning since the last visit.  Ozell Silvan, PT, DPT, OCS, ATC 07/25/24  1:29 PM       Date of referral: 03/08/2024 Referring provider: Shirly Carlin CROME, PA-C  Referring diagnosis? S03.348 (ICD-10-CM) - S/P total knee arthroplasty, right  Treatment diagnosis? (if different than referring diagnosis)  Stiffness of right knee, not elsewhere classified M25.661  Chronic pain of right knee M25.561, G89.29  Muscle weakness (generalized)  M62.81  Other abnormalities of gait and mobility R26.89  Localized edema R60.0  What was this (referring dx) caused by? Surgery (Type: TKA)  Lysle of Condition: Initial Onset (within last 3 months)   Laterality: Rt  Current Functional Measure Score: Patient Specific Functional Scale 5/10  Objective measurements identify impairments when they are compared to normal values, the uninvolved extremity, and prior level of function.  [x]  Yes  []  No  Objective assessment of functional ability: Moderate functional limitations   Briefly describe symptoms: right knee range limiting ADLs & gait, pain in right knee  How did symptoms start: surgery  Average pain intensity:  Last 24 hours: 3-10/10  Past week: 3-10/10  How often does the pt experience symptoms? Constantly  How much have the symptoms interfered with usual daily activities? Quite a bit  How has condition changed since care began at this facility? NA - initial visit  In general, how is the patients overall health? Very Good   BACK PAIN (STarT Back Screening Tool) No

## 2024-05-19 ENCOUNTER — Encounter: Payer: Self-pay | Admitting: Orthopedic Surgery

## 2024-05-19 ENCOUNTER — Ambulatory Visit: Admitting: Orthopedic Surgery

## 2024-05-19 DIAGNOSIS — Z96651 Presence of right artificial knee joint: Secondary | ICD-10-CM

## 2024-05-19 MED ORDER — MELOXICAM 15 MG PO TABS: ORAL_TABLET | ORAL | 0 refills | Status: DC

## 2024-05-19 NOTE — Progress Notes (Signed)
 Post-Op Visit Note   Patient: Daniel Hurley           Date of Birth: 06-24-1956           MRN: [id number] Visit Date: 05/19/2024 PCP: Hughie Sharper, MD   Assessment & Plan:  Chief Complaint:  Chief Complaint  Patient presents with   Right Knee - Follow-up   Visit Diagnoses:  1. S/P total knee arthroplasty, right     Plan: Patient underwent right knee manipulation under anesthesia several weeks ago.  In general he is able to walk around without pain but he is concerned about the stiffness in his knee.  He had an aspiration last clinic visit which showed slightly elevated white blood cell count for total knee replacements.  He is not having any fevers or chills.  He is having 1 to 2 days of aching pain in the knee.  Does hurt him when he tries to bend it close to 90 degrees.  States he feels good after physical therapy but then the knee stiffens up again.  On exam his range of motion today cold is about 5 to 80 degrees.  Plan was to reaspirate the knee today because of slightly increased white count in that knee replacement.  However there is no effusion today.  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  The knee does not really look overtly infected with absence of effusion warmth and no real systemic symptoms.  He is able to play golf which he did yesterday.  Plan at this time is to see him back in a month and we will consider repeat attempt at aspiration at that time.  I want to follow this along for now but I think for Isaid his options would be arthroscopic scar tissue removal versus open synovectomy versus living with this for now.  Come back in 4 weeks for clinical recheck and possible repeat aspiration of the knee at that time.  Follow-Up Instructions: No follow-ups on file.   Orders:  No orders of the defined types were placed in this encounter.  Meds ordered this encounter  Medications   meloxicam  (MOBIC ) 15 MG tablet     Sig: 1 po q d x 30 days    Dispense:  30 tablet    Refill:  0    Imaging: No results found.  PMFS History: Patient Active Problem List   Diagnosis Date Noted   Arthrofibrosis of knee joint, right 03/26/2024   Arthritis of right knee 01/30/2024   S/P total knee replacement, right 01/27/2024   Unstable angina (HCC)    Chronic neck pain 11/22/2020   Hypertension 02/12/2020   Acute respiratory failure with hypoxia (HCC) 01/20/2016   Chest pain 01/19/2016   COPD exacerbation (HCC) 01/19/2016   Tobacco abuse 01/19/2016   Past Medical History:  Diagnosis Date   COPD (chronic obstructive pulmonary disease) (HCC)    Coronary artery disease    Hypertension 02/12/2020    No family history on file.  Past Surgical History:  Procedure Laterality Date   ANKLE FUSION  2006   BACK SURGERY  2008   L5-5 fusion    CORONARY PRESSURE/FFR STUDY N/A 07/08/2022   Procedure: INTRAVASCULAR PRESSURE WIRE/FFR STUDY;  Surgeon: Verlin Lonni BIRCH, MD;  Location: [facility];  Service: Cardiovascular;  Laterality: N/A;   CORONARY STENT INTERVENTION N/A 07/08/2022   Procedure: CORONARY STENT INTERVENTION;  Surgeon: Verlin Lonni BIRCH, MD;  Location: [facility];  Service: Cardiovascular;  Laterality: N/A;   INGUINAL HERNIA REPAIR Right 12/27/2023   Procedure: OPEN RIGHT INGUINAL HERNIA REPAIR WITH MESH;  Surgeon: Ebbie Cough, MD;  Location: [facility] OR;  Service: General;  Laterality: Right;   KNEE CLOSED REDUCTION Right 03/14/2024   Procedure: CLOSED RIGHT KNEE MANIPULATION;  Surgeon: Addie Cordella Hamilton, MD;  Location: [facility] OR;  Service: Orthopedics;  Laterality: Right;   LEFT HEART CATH AND CORONARY ANGIOGRAPHY N/A 07/08/2022   Procedure: LEFT HEART CATH AND CORONARY ANGIOGRAPHY;  Surgeon: Verlin Lonni BIRCH, MD;  Location: [facility];  Service: Cardiovascular;  Laterality: N/A;   left wrist surgery     TOTAL KNEE ARTHROPLASTY Right 01/27/2024   Procedure: RIGHT TOTAL  KNEE ARTHROPLASTY;  Surgeon: Addie Cordella Hamilton, MD;  Location: [facility] OR;  Service: Orthopedics;  Laterality: Right;   Social History   Occupational History   Not on file  Tobacco Use   Smoking status: Every Day    Current packs/day: 1.50    Types: Cigarettes   Smokeless tobacco: Never  Vaping Use   Vaping status: Never Used  Substance and Sexual Activity   Alcohol use: Not Currently    Comment: rarely   Drug use: No   Sexual activity: Not Currently

## 2024-06-08 ENCOUNTER — Ambulatory Visit: Admitting: Orthopedic Surgery

## 2024-06-16 ENCOUNTER — Ambulatory Visit: Admitting: Orthopedic Surgery

## 2024-09-11 ENCOUNTER — Encounter: Payer: Self-pay | Admitting: Radiology

## 2024-09-20 ENCOUNTER — Other Ambulatory Visit: Payer: Self-pay | Admitting: Family Medicine

## 2024-09-20 DIAGNOSIS — G8929 Other chronic pain: Secondary | ICD-10-CM

## 2024-09-27 ENCOUNTER — Other Ambulatory Visit: Payer: Self-pay | Admitting: Family Medicine

## 2024-09-27 DIAGNOSIS — M25512 Pain in left shoulder: Secondary | ICD-10-CM

## 2024-10-10 LAB — LAB REPORT - SCANNED
A1c: 6
EGFR: 70
PSA, Total: 0.7

## 2024-10-11 ENCOUNTER — Encounter: Payer: Self-pay | Admitting: *Deleted

## 2024-10-11 NOTE — Progress Notes (Signed)
 HORATIO BERTZ                                          MRN: [id number]   10/11/2024   The [facility] Quality Team Specialist reviewed this patient medical record for the purposes of chart review for care gap closure. The following were reviewed: chart review for care gap closure-colorectal cancer screening.    [facility] Quality Team

## 2024-10-18 ENCOUNTER — Ambulatory Visit
Admission: RE | Admit: 2024-10-18 | Discharge: 2024-10-18 | Disposition: A | Discharge status: Home / Self Care | Source: Ambulatory Visit | Attending: Family Medicine | Admitting: Family Medicine

## 2024-10-18 DIAGNOSIS — M25512 Pain in left shoulder: Secondary | ICD-10-CM

## 2024-10-26 ENCOUNTER — Ambulatory Visit: Payer: PRIVATE HEALTH INSURANCE | Admitting: Orthopedic Surgery

## 2024-10-26 DIAGNOSIS — G8929 Other chronic pain: Secondary | ICD-10-CM

## 2024-10-26 DIAGNOSIS — M25512 Pain in left shoulder: Secondary | ICD-10-CM | POA: Diagnosis not present

## 2024-10-27 ENCOUNTER — Encounter: Payer: Self-pay | Admitting: Orthopedic Surgery

## 2024-10-27 NOTE — Progress Notes (Unsigned)
 "  Office Visit Note   Patient: Daniel Hurley           Date of Birth: 1956/02/10           MRN: [id number] Visit Date: 10/26/2024 Requested by: Hughie Sharper, MD [address] [place],  [place] [address] PCP: Hughie Sharper, MD  Subjective: Chief Complaint  Patient presents with   Left Shoulder - Pain    HPI: Daniel Hurley is a 68 y.o. male who presents to the office reporting ***.                ROS: All systems reviewed are negative as they relate to the chief complaint within the history of present illness.  Patient denies fevers or chills.  Assessment & Plan: Visit Diagnoses:  1. Chronic left shoulder pain     Plan: ***  Follow-Up Instructions: No follow-ups on file.   Orders:  Orders Placed This Encounter  Procedures   CT SHOULDER LEFT WO CONTRAST   No orders of the defined types were placed in this encounter.     Procedures: No procedures performed   Clinical Data: No additional findings.  Objective: Vital Signs: There were no vitals taken for this visit.  Physical Exam:  Constitutional: Patient appears well-developed HEENT:  Head: Normocephalic Eyes:EOM are normal Neck: Normal range of motion Cardiovascular: Normal rate Pulmonary/chest: Effort normal Neurologic: Patient is alert Skin: Skin is warm Psychiatric: Patient has normal mood and affect  Ortho Exam: ***  Specialty Comments:  No specialty comments available.  Imaging: No results found.   PMFS History: Patient Active Problem List   Diagnosis Date Noted   Arthrofibrosis of knee joint, right 03/26/2024   Arthritis of right knee 01/30/2024   S/P total knee replacement, right 01/27/2024   Unstable angina (HCC)    Chronic neck pain 11/22/2020   Hypertension 02/12/2020   Acute respiratory failure with hypoxia (HCC) 01/20/2016   Chest pain 01/19/2016   COPD exacerbation (HCC) 01/19/2016   Tobacco abuse 01/19/2016   Past Medical History:  Diagnosis Date   COPD  (chronic obstructive pulmonary disease) (HCC)    Coronary artery disease    Hypertension 02/12/2020    No family history on file.  Past Surgical History:  Procedure Laterality Date   ANKLE FUSION  2006   BACK SURGERY  2008   L5-5 fusion    CORONARY PRESSURE/FFR STUDY N/A 07/08/2022   Procedure: INTRAVASCULAR PRESSURE WIRE/FFR STUDY;  Surgeon: Verlin Lonni BIRCH, MD;  Location: [facility];  Service: Cardiovascular;  Laterality: N/A;   CORONARY STENT INTERVENTION N/A 07/08/2022   Procedure: CORONARY STENT INTERVENTION;  Surgeon: Verlin Lonni BIRCH, MD;  Location: [facility];  Service: Cardiovascular;  Laterality: N/A;   INGUINAL HERNIA REPAIR Right 12/27/2023   Procedure: OPEN RIGHT INGUINAL HERNIA REPAIR WITH MESH;  Surgeon: Ebbie Cough, MD;  Location: [facility] OR;  Service: General;  Laterality: Right;   KNEE CLOSED REDUCTION Right 03/14/2024   Procedure: CLOSED RIGHT KNEE MANIPULATION;  Surgeon: Addie Cordella Hamilton, MD;  Location: [facility] OR;  Service: Orthopedics;  Laterality: Right;   LEFT HEART CATH AND CORONARY ANGIOGRAPHY N/A 07/08/2022   Procedure: LEFT HEART CATH AND CORONARY ANGIOGRAPHY;  Surgeon: Verlin Lonni BIRCH, MD;  Location: [facility];  Service: Cardiovascular;  Laterality: N/A;   left wrist surgery     TOTAL KNEE ARTHROPLASTY Right 01/27/2024   Procedure: RIGHT TOTAL KNEE ARTHROPLASTY;  Surgeon: Addie Cordella Hamilton, MD;  Location:  [facility] OR;  Service: Orthopedics;  Laterality: Right;   Social History   Occupational History   Not on file  Tobacco Use   Smoking status: Every Day    Current packs/day: 1.50    Types: Cigarettes   Smokeless tobacco: Never  Vaping Use   Vaping status: Never Used  Substance and Sexual Activity   Alcohol use: Not Currently    Comment: rarely   Drug use: No   Sexual activity: Not Currently        "

## 2024-10-28 ENCOUNTER — Encounter: Payer: Self-pay | Admitting: Orthopedic Surgery

## 2024-11-03 ENCOUNTER — Ambulatory Visit
Admission: RE | Admit: 2024-11-03 | Discharge: 2024-11-03 | Disposition: A | Discharge status: Home / Self Care | Source: Ambulatory Visit | Attending: Orthopedic Surgery | Admitting: Orthopedic Surgery

## 2024-11-03 DIAGNOSIS — G8929 Other chronic pain: Secondary | ICD-10-CM

## 2024-11-20 ENCOUNTER — Ambulatory Visit: Payer: Self-pay | Admitting: Orthopedic Surgery

## 2024-11-20 NOTE — Progress Notes (Signed)
 CT done please post thanks

## 2024-11-24 ENCOUNTER — Telehealth (HOSPITAL_BASED_OUTPATIENT_CLINIC_OR_DEPARTMENT_OTHER): Payer: Self-pay

## 2024-11-24 NOTE — Telephone Encounter (Signed)
 Left message to call back and schedule an appt in office preop clearance.

## 2024-11-24 NOTE — Telephone Encounter (Signed)
"  ° °  Pre-operative Risk Assessment    Patient Name: Daniel Hurley  DOB: 24-Feb-1956 MRN: [id number]   Date of last office visit: 12/27/23 with Jackee Alberts, NP Date of next office visit: None   Request for Surgical Clearance    Procedure:  Left Reverse Shoulder Arthroplasty   Date of Surgery:  Clearance 12/12/24                                Surgeon:  Dr. Glendia Hutchinson MD Surgeon's Group or Practice Name:  [facility] Phone number:  [phone] Fax number:  [phone]   Type of Clearance Requested:   - Medical  - Pharmacy:  Hold Aspirin      Type of Anesthesia:  General    Additional requests/questions:    [name] [name]   11/24/2024, 11:56 AM   "

## 2024-11-24 NOTE — Telephone Encounter (Signed)
" ° °  Name: Daniel Hurley  DOB: February 07, 1956  MRN: [id number]  Primary Cardiologist: Maude Emmer, MD  Chart reviewed as part of pre-operative protocol coverage. It has almost been 1 year since patient has been seen in the office. Therefore, I would recommended patient have a a follow-up in-office visit for routine 1 year follow-up and pre-op evaluation.   Pre-op covering staff: - Please schedule appointment and call patient to inform them. If patient already had an upcoming appointment within acceptable timeframe, please add pre-op clearance to the appointment notes so provider is aware. - Please contact requesting surgeon's office via preferred method (i.e, phone, fax) to inform them of need for appointment prior to surgery.  In regards to Aspirin : A shoulder arthroplasty is considered high risk for bleeding and patient's PCI was over 1 year ago (2023). Therefore, per our office algorithm, OK to hold Aspirin  if necessary.   Jozey Janco E Johnpaul Gillentine, PA-C  11/24/2024, 12:30 PM    "

## 2024-11-27 NOTE — Telephone Encounter (Signed)
 Pt has appt 12/06/24 Lum Louis, NP for preop clearance.

## 2024-11-28 NOTE — Telephone Encounter (Signed)
 thx

## 2024-12-06 ENCOUNTER — Encounter: Payer: Self-pay | Admitting: Emergency Medicine

## 2024-12-06 ENCOUNTER — Ambulatory Visit: Attending: Internal Medicine | Admitting: Emergency Medicine

## 2024-12-06 VITALS — BP 130/70 | HR 76 | Ht 68.0 in | Wt 188.0 lb

## 2024-12-06 DIAGNOSIS — I2510 Atherosclerotic heart disease of native coronary artery without angina pectoris: Secondary | ICD-10-CM

## 2024-12-06 DIAGNOSIS — Z01810 Encounter for preprocedural cardiovascular examination: Secondary | ICD-10-CM | POA: Diagnosis not present

## 2024-12-06 DIAGNOSIS — Z720 Tobacco use: Secondary | ICD-10-CM | POA: Diagnosis not present

## 2024-12-06 DIAGNOSIS — I10 Essential (primary) hypertension: Secondary | ICD-10-CM

## 2024-12-06 DIAGNOSIS — E785 Hyperlipidemia, unspecified: Secondary | ICD-10-CM | POA: Diagnosis not present

## 2024-12-06 NOTE — Pre-Procedure Instructions (Addendum)
 Surgical Instructions   Your procedure is scheduled on December 12, 2024. Report to [facility] Main Entrance A at 09:30 A.M., then check in with the Admitting office. Any questions or running late day of surgery: call [phone]  Questions prior to your surgery date: call [phone], Monday-Friday, 8am-4pm. If you experience any cold or flu symptoms such as cough, fever, chills, shortness of breath, etc. between now and your scheduled surgery, please notify us  at the above number.     Remember:  Do not eat after midnight the night before your surgery  You may drink clear liquids until 08:30 the morning of your surgery.   Clear liquids allowed are: Water , Non-Citrus Juices (without pulp), Carbonated Beverages, Clear Tea (no milk, honey, etc.), Black Coffee Only (NO MILK, CREAM OR POWDERED CREAMER of any kind), and Gatorade.  Patient Instructions  The night before surgery:  No food after midnight. ONLY clear liquids after midnight  The day of surgery (if you do NOT have diabetes):  Drink ONE (1) Pre-Surgery Clear Ensure by 08:30 the morning of surgery. Drink in one sitting. Do not sip.  This drink was given to you during your hospital  pre-op appointment visit.  Nothing else to drink after completing the  Pre-Surgery Clear Ensure.    Take these medicines the morning of surgery with A SIP OF WATER   amLODipine  (NORVASC )  Fluticasone -Umeclidin-Vilant  rosuvastatin  (CRESTOR )   May take these medicines IF NEEDED: albuterol  (VENTOLIN  HFA) - please bring this with you to the hospital baclofen  (LIORESAL )  HYDROcodone -acetaminophen  (NORCO/VICODIN)  nitroGLYCERIN  (NITROSTAT )  - if you use this prior to surgery, please call [phone] and let us  know  One week prior to surgery, STOP taking any Aleve, Naproxen, Ibuprofen, Motrin, Advil, Goody's, BC's, all herbal medications, fish oil, and non-prescription vitamins. This includes your meloxicam  (MOBIC ) .  Follow your surgeon's  instructions for stopping aspirin . If you have not received instructions, call the surgeon's office.                     Do NOT Smoke (Tobacco/Vaping) for 24 hours prior to your procedure.  If you use a CPAP at night, you may bring your mask/headgear for your overnight stay.   You will be asked to remove any contacts, glasses, piercing's, hearing aid's, dentures/partials prior to surgery. Please bring cases for these items if needed.    Your surgeon will determine if you are to be admitted or discharged the same day.  Patients discharged the day of surgery will not be allowed to drive home, and someone needs to stay with them for 24 hours.  SURGICAL WAITING ROOM VISITATION Patients may have no more than 2 support people in the waiting area - these visitors may rotate.   Pre-op nurse will coordinate an appropriate time for 2 ADULT support persons, who may not rotate, to accompany patient in pre-op.  Children under the age of 22 must have an adult with them who is not the patient and must remain in the main waiting area with an adult.  If the patient needs to stay at the hospital during part of their recovery, the visitor guidelines for inpatient rooms apply.  Please refer to the [facility] website for the visitor guidelines for any additional information.   If you received a COVID test during your pre-op visit  it is requested that you wear a mask when out in public, stay away from anyone that may not be feeling well and notify your  surgeon if you develop symptoms. If you have been in contact with anyone that has tested positive in the last 10 days please notify you surgeon.    Oral Hygiene is also important to reduce your risk of infection.  Remember - BRUSH YOUR TEETH THE MORNING OF SURGERY WITH YOUR REGULAR TOOTHPASTE  [place]- Preparing for Total Shoulder Arthroplasty  Before surgery, you can play an important role. Because skin is not sterile, your skin needs to be as free of  germs as possible. You can reduce the number of germs on your skin by using the following products.   Benzoyl Peroxide Gel  o Reduces the number of germs present on the skin  o Applied twice a day to shoulder area starting two days before surgery   Chlorhexidine  Gluconate (CHG) Soap (instructions listed below on how to wash with CHG Soap)  o An antiseptic cleaner that kills germs and bonds with the skin to continue killing germs even after washing  o Used for showering the night before surgery and morning of surgery   ==================================================================  Please follow these instructions carefully:  BENZOYL PEROXIDE 5% GEL  Please do not use if you have an allergy to benzoyl peroxide. If your skin becomes reddened/irritated stop using the benzoyl peroxide.  Starting two days before surgery, apply as follows:  1. Apply benzoyl peroxide in the morning and at night. Apply after taking a shower. If you are not taking a shower clean entire shoulder front, back, and side along with the armpit with a clean wet washcloth.  2. Place a quarter-sized dollop on your SHOULDER and rub in thoroughly, making sure to cover the front, back, and side of your shoulder, along with the armpit.   2 Days prior to Surgery First Dose on _____________ Morning Second Dose on ______________ Night  Day Before Surgery First Dose on ______________ Morning Night before surgery wash (entire body except face and private areas) with CHG Soap THEN Second Dose on ____________ Night      4. Do NOT apply benzoyl peroxide gel on the day of surgery           Pre-operative 4 CHG Bathing Instructions   You can play a key role in reducing the risk of infection after surgery. Your skin needs to be as free of germs as possible. You can reduce the number of germs on your skin by washing with CHG (chlorhexidine  gluconate) soap before surgery. CHG is an antiseptic soap that kills  germs and continues to kill germs even after washing.   DO NOT use if you have an allergy to chlorhexidine /CHG or antibacterial soaps. If your skin becomes reddened or irritated, stop using the CHG and notify one of our RNs at [phone].   Please shower with the CHG soap starting 4 days before surgery using the following schedule:     Please keep in mind the following:  DO NOT shave, including legs and underarms, starting the day of your first shower.   You may shave your face at any point before/day of surgery.  Place clean sheets on your bed the day you start using CHG soap. Use a clean washcloth (not used since being washed) for each shower. DO NOT sleep with pets once you start using the CHG.   CHG Shower Instructions:  Wash your face and private area with normal soap. If you choose to wash your hair, wash first with your normal shampoo.  After you use shampoo/soap, rinse your hair and  body thoroughly to remove shampoo/soap residue.  Turn the water  OFF and apply  bottle of CHG soap to a CLEAN washcloth.  Apply CHG soap ONLY FROM YOUR NECK DOWN TO YOUR TOES (washing for 3-5 minutes)  DO NOT use CHG soap on face, private areas, open wounds, or sores.  Pay special attention to the area where your surgery is being performed.  If you are having back surgery, having someone wash your back for you may be helpful. Wait 2 minutes after CHG soap is applied, then you may rinse off the CHG soap.  Pat dry with a clean towel  Put on clean clothes/pajamas   If you choose to wear lotion, please use ONLY the CHG-compatible lotions that are listed below.  Additional instructions for the day of surgery:  If you choose, you may shower the morning of surgery with an antibacterial soap.  DO NOT APPLY any lotions, deodorants, cologne, or perfumes.   Do not bring valuables to the hospital. [facility] is not responsible for any belongings/valuables. Do not wear nail polish, gel polish, artificial  nails, or any other type of covering on natural nails (fingers and toes) Do not wear jewelry or makeup Put on clean/comfortable clothes.  Please brush your teeth.  Ask your nurse before applying any prescription medications to the skin.     CHG Compatible Lotions   Aveeno Moisturizing lotion  Cetaphil Moisturizing Cream  Cetaphil Moisturizing Lotion  Clairol Herbal Essence Moisturizing Lotion, Dry Skin  Clairol Herbal Essence Moisturizing Lotion, Extra Dry Skin  Clairol Herbal Essence Moisturizing Lotion, Normal Skin  Curel Age Defying Therapeutic Moisturizing Lotion with Alpha Hydroxy  Curel Extreme Care Body Lotion  Curel Soothing Hands Moisturizing Hand Lotion  Curel Therapeutic Moisturizing Cream, Fragrance-Free  Curel Therapeutic Moisturizing Lotion, Fragrance-Free  Curel Therapeutic Moisturizing Lotion, Original Formula  Eucerin Daily Replenishing Lotion  Eucerin Dry Skin Therapy Plus Alpha Hydroxy Crme  Eucerin Dry Skin Therapy Plus Alpha Hydroxy Lotion  Eucerin Original Crme  Eucerin Original Lotion  Eucerin Plus Crme Eucerin Plus Lotion  Eucerin TriLipid Replenishing Lotion  Keri Anti-Bacterial Hand Lotion  Keri Deep Conditioning Original Lotion Dry Skin Formula Softly Scented  Keri Deep Conditioning Original Lotion, Fragrance Free Sensitive Skin Formula  Keri Lotion Fast Absorbing Fragrance Free Sensitive Skin Formula  Keri Lotion Fast Absorbing Softly Scented Dry Skin Formula  Keri Original Lotion  Keri Skin Renewal Lotion Keri Silky Smooth Lotion  Keri Silky Smooth Sensitive Skin Lotion  Nivea Body Creamy Conditioning Oil  Nivea Body Extra Enriched Lotion  Nivea Body Original Lotion  Nivea Body Sheer Moisturizing Lotion Nivea Crme  Nivea Skin Firming Lotion  NutraDerm 30 Skin Lotion  NutraDerm Skin Lotion  NutraDerm Therapeutic Skin Cream  NutraDerm Therapeutic Skin Lotion  ProShield Protective Hand Cream  Provon moisturizing lotion  Please read over  the following fact sheets that you were given.

## 2024-12-06 NOTE — Patient Instructions (Addendum)
 Medication Instructions:  NO CHANGES  Lab Work: NONE TO BE DONE TODAY.  Testing/Procedures: NONE  Follow-Up: At [facility], you and your health needs are our priority.  As part of our continuing mission to provide you with exceptional heart care, our providers are all part of one team.  This team includes your primary Cardiologist (physician) and Advanced Practice Providers or APPs (Physician Assistants and Nurse Practitioners) who all work together to provide you with the care you need, when you need it.  Your next appointment:   1 YEAR  Provider:   Maude Emmer, MD or Lum Louis, DNP  We recommend signing up for the patient portal called MyChart.  Sign up information is provided on this After Visit Summary.  MyChart is used to connect with patients for Virtual Visits (Telemedicine).  Patients are able to view lab/test results, encounter notes, upcoming appointments, etc.  Non-urgent messages can be sent to your provider as well.   To learn more about what you can do with MyChart, go to [email or web address].   Other Instructions:

## 2024-12-06 NOTE — Progress Notes (Signed)
 " Cardiology Office Note:    Date:  12/06/2024  ID:  Daniel Hurley, DOB 1955/11/30, MRN [id number] PCP: Hughie Sharper, MD  [facility] Providers Cardiologist:  Maude Emmer, MD       Patient Profile:       Chief Complaint: Preoperative clearance in 1 year follow-up History of Present Illness:  Daniel Hurley is a 69 y.o. male with visit-pertinent history of coronary artery disease s/p PCI/DES to mid LAD on 06/2022, hypertension, hyperlipidemia, tobacco abuse  He underwent echo stress test on 09/2018 which was negative for ischemia and arrhythmias.  He underwent cardiac catheterization on 06/2022 showing severe mid LAD stenosis, mild nonobstructive disease in the ostial circumflex and intermediate branch, mild nonobstructive disease in the mid RCA with successful PTCA/DES x 1 to mid LAD.  Lexiscan  Myoview  12/2022 was a low risk study with no ischemia.  Last seen in clinic by Jackee, NP on 12/2023.  He was doing well without anginal symptoms.  No changes were made.   Discussed the use of AI scribe software for clinical note transcription with the patient, who gave verbal consent to proceed.  History of Present Illness Daniel Hurley is a 69 year old male with coronary artery disease and COPD who presents for a one-year follow-up and preoperative cardiac clearance for upcoming left shoulder surgery.   Today patient is doing well without acute cardiovascular concerns or complaints.  He has no exertional chest discomfort or worsening of chronic shortness of breath and tolerates activities such as playing golf twice a week without anginal symptoms.  He has a recent mild chest cold with a sore area from coughing at the lower rib cage for a few days, without typical anginal chest discomfort. He has COPD with mild emphysema and sometimes gets winded, and he recently completed a steroid dose pack with symptomatic improvement.  He smokes about one pack of cigarettes per day despite attempts  to cut down. He reports increased stress related to ongoing legal matters after his mother's death.  His lipids in 12/10/2025showed LDL 37, HDL 59, and triglycerides 76. He takes rosuvastatin  40 mg daily.  He denies syncope, presyncope, lightheadedness, dizziness, orthopnea, PND, weight gain, LEE   Review of systems:  Please see the history of present illness. All other systems are reviewed and otherwise negative.      Studies Reviewed:    EKG Interpretation Date/Time:  Wednesday December 06 2024 11:02:59 EST Ventricular Rate:  76 PR Interval:  150 QRS Duration:  84 QT Interval:  406 QTC Calculation: 456 R Axis:   7  Text Interpretation: Normal sinus rhythm Normal ECG When compared with ECG of 22-Dec-2023 13:39, No significant change was found Confirmed by Rana Dixon [phone]) on 12/06/2024 11:56:39 AM    Lexiscan  Myoview  12/23/2022   Findings are consistent with no ischemia. The study is low risk.   No ST deviation was noted.   LV perfusion is abnormal. Defect 1: There is a medium defect with mild reduction in uptake present in the apical to basal inferior location(s) that is fixed. There is normal wall motion in the defect area. Consistent with artifact.   Left ventricular function is abnormal. Global function is mildly reduced. Nuclear stress EF: 52 %. The left ventricular ejection fraction is mildly decreased (45-54%). End diastolic cavity size is normal. End systolic cavity size is normal.   Prior study not available for comparison.   Inferior wall with decreased uptake at rest that improves slightly  with stress, most consistent with artifact. No ischemia noted. Mildly reduced LVEF without focal wall motion abnormalities.  Cardiac catheterization 07/08/2022   Prox RCA lesion is 20% stenosed.   Ost Cx to Prox Cx lesion is 40% stenosed.   Ramus lesion is 40% stenosed.   Prox LAD to Mid LAD lesion is 70% stenosed.   A drug-eluting stent was successfully placed using a  SYNERGY XD [id number].   Post intervention, there is a 0% residual stenosis.   Severe mid LAD stenosis. RFR 0.84.  Mild non-obstructive disease in the ostial Circumflex and intermediate branch.  Mild non-obstructive disease in the mid RCA Successful PTCA/DES x 1 mid LAD   Recommendations: Continue DAPT with ASA and Plavix  for at least six months. Continue beta blocker and statin.  Diagnostic Dominance: Right  Intervention   Risk Assessment/Calculations:              Physical Exam:   VS:  BP 130/70 (BP Location: Right Arm, Patient Position: Sitting, Cuff Size: Normal)   Pulse 76   Ht 5' 8 (1.727 m)   Wt 188 lb (85.3 kg)   BMI 28.59 kg/m    Wt Readings from Last 3 Encounters:  12/06/24 188 lb (85.3 kg)  03/14/24 168 lb (76.2 kg)  01/27/24 181 lb (82.1 kg)    GEN: Well nourished, well developed in no acute distress NECK: No JVD; No carotid bruits CARDIAC: RRR, no murmurs, rubs, gallops RESPIRATORY:  Clear to auscultation without rales, wheezing or rhonchi  ABDOMEN: Soft, non-tender, non-distended EXTREMITIES:  No edema; No acute deformity      Assessment and Plan:  Coronary artery disease S/p PTCA/DES x 1 to mid LAD on 06/2022 Lexiscan  Myoview  12/2022 was low risk without ischemia - Today patient is stable without anginal symptoms.  Golfs regularly without exertional chest discomfort.  Denies prior anginal equivalent.  No indication for ischemic evaluation at this time - EKG today without acute ischemic features - Continue aspirin  81 mg daily, amlodipine  5 mg daily, rosuvastatin  40 mg daily, and nitroglycerin  as needed  Hyperlipidemia LDL 37, HDL 51, TG 76 on 10/2024 and well-controlled - Continue rosuvastatin  40 mg daily  Hypertension Blood pressure today is well-controlled at 130/70 - Continue amlodipine  5 mg daily  Tobacco abuse Currently smoking 1 pack/day - Tobacco cessation discussed and encouraged  Preoperative cardiovascular clearance Left reverse  shoulder arthroplasty on 12/12/2024 with [facility]  According to the Revised Cardiac Risk Index (RCRI), his Perioperative Risk of Major Cardiac Event is (%): 0.9. His Functional Capacity in METs is: 6.36 according to the Duke Activity Status Index (DASI). Therefore, based on ACC/AHA guidelines, patient would be at acceptable risk for the planned procedure without further cardiovascular testing. I will route this recommendation to the requesting party via Epic fax function.  In regards to Aspirin : A shoulder arthroplasty is considered high risk for bleeding and patient's PCI was over 1 year ago (2023). Therefore, per our office algorithm, OK to hold Aspirin  if necessary.       Dispo:  Return in about 1 year (around 12/06/2025).  Signed, Lum LITTIE Louis, NP  "

## 2024-12-07 ENCOUNTER — Other Ambulatory Visit: Payer: Self-pay

## 2024-12-07 ENCOUNTER — Encounter (HOSPITAL_COMMUNITY)
Admission: RE | Admit: 2024-12-07 | Discharge: 2024-12-07 | Disposition: A | Discharge status: Home / Self Care | Source: Ambulatory Visit | Attending: Orthopedic Surgery | Admitting: Orthopedic Surgery

## 2024-12-07 ENCOUNTER — Encounter (HOSPITAL_COMMUNITY): Payer: Self-pay

## 2024-12-07 VITALS — BP 133/93 | HR 86 | Temp 98.4°F | Resp 18 | Ht 68.0 in | Wt 189.2 lb

## 2024-12-07 DIAGNOSIS — R7303 Prediabetes: Secondary | ICD-10-CM | POA: Diagnosis not present

## 2024-12-07 DIAGNOSIS — I10 Essential (primary) hypertension: Secondary | ICD-10-CM | POA: Insufficient documentation

## 2024-12-07 DIAGNOSIS — M19012 Primary osteoarthritis, left shoulder: Secondary | ICD-10-CM | POA: Diagnosis not present

## 2024-12-07 DIAGNOSIS — Z01812 Encounter for preprocedural laboratory examination: Secondary | ICD-10-CM | POA: Insufficient documentation

## 2024-12-07 DIAGNOSIS — I2510 Atherosclerotic heart disease of native coronary artery without angina pectoris: Secondary | ICD-10-CM | POA: Diagnosis not present

## 2024-12-07 DIAGNOSIS — J449 Chronic obstructive pulmonary disease, unspecified: Secondary | ICD-10-CM | POA: Diagnosis not present

## 2024-12-07 DIAGNOSIS — Z01818 Encounter for other preprocedural examination: Secondary | ICD-10-CM

## 2024-12-07 DIAGNOSIS — Z955 Presence of coronary angioplasty implant and graft: Secondary | ICD-10-CM | POA: Insufficient documentation

## 2024-12-07 HISTORY — DX: Unspecified osteoarthritis, unspecified site: M19.90

## 2024-12-07 HISTORY — DX: Prediabetes: R73.03

## 2024-12-07 LAB — BASIC METABOLIC PANEL WITH GFR
Anion gap: 8 (ref 5–15)
BUN: 13 mg/dL (ref 8–23)
CO2: 29 mmol/L (ref 22–32)
Calcium: 9.5 mg/dL (ref 8.9–10.3)
Chloride: 103 mmol/L (ref 98–111)
Creatinine, Ser: 1.08 mg/dL (ref 0.61–1.24)
GFR, Estimated: >60 mL/min
Glucose, Bld: 92 mg/dL (ref 70–99)
Potassium: 4 mmol/L (ref 3.5–5.1)
Sodium: 140 mmol/L (ref 135–145)

## 2024-12-07 LAB — CBC
HCT: 46.8 % (ref 39.0–52.0)
Hemoglobin: 15.3 g/dL (ref 13.0–17.0)
MCH: 30.3 pg (ref 26.0–34.0)
MCHC: 32.7 g/dL (ref 30.0–36.0)
MCV: 92.7 fL (ref 80.0–100.0)
Platelets: 286 10*3/uL (ref 150–400)
RBC: 5.05 MIL/uL (ref 4.22–5.81)
RDW: 13.7 % (ref 11.5–15.5)
WBC: 9.4 10*3/uL (ref 4.0–10.5)
nRBC: 0 % (ref 0.0–0.2)

## 2024-12-07 LAB — SURGICAL PCR SCREEN
MRSA, PCR: NEGATIVE
Staphylococcus aureus: NEGATIVE

## 2024-12-07 NOTE — Progress Notes (Signed)
 PCP - Dr. Ozell Dopp Cardiologist - Dr. Maude Emmer - last office visit 12/06/2024 with NP  PPM/ICD - Denies Device Orders - n/a Rep Notified - n/a  Chest x-ray - n/a EKG - 12/06/2024 Stress Test - 12/23/2022 ECHO - 09/19/2018 Cardiac Cath - 07/08/2022 with DES placed  Sleep Study - Denies CPAP - n/a  Pt is Pre-DM  Last dose of GLP1 agonist- n/a GLP1 instructions: n/a  Blood Thinner Instructions: n/a Aspirin  Instructions: Pt has already held his ASA. Last dose was January 28th.  ERAS Protcol - Clear liquids until 0830 morning of surgery PRE-SURGERY Ensure or G2- Ensure given to pt with instructions  COVID TEST- n/a   Anesthesia review: Yes. Cardiac clearance. Pt notes a chest cold that started last week. He denied any symptoms other than a non-productive cough, but PCP did put him on an antibiotic. He was coughing a little yesterday, but has not today. Discussed with Lynwood Hope, PA-C who will reach out to Dr. Brion office about probable postponing of surgery until complete symptom resolution.    Patient denies shortness of breath, fever, cough and chest pain at PAT appointment   All instructions explained to the patient, with a verbal understanding of the material. Patient agrees to go over the instructions while at home for a better understanding. Patient also instructed to self quarantine after being tested for COVID-19. The opportunity to ask questions was provided.

## 2024-12-08 NOTE — Progress Notes (Signed)
 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 Surgery was scheduled for 12/13/2023, but he developed cold symptoms around the week of January 19th and was started on antibiotics. He had some residual non-productive coughing up to ~ 12/07/2024, so surgery postponed until 12/28/2024 to allow time to fully recover.    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   His last cardiology evaluation was on 12/06/2024 with Rana Dixon, NP. S/p DES to mid LAD on 07/08/2022. Last stress test for chest tightness was on 12/23/2022 and showed inferior wall perfusion abnormality that improved slightly with stress and felt most consistent with artifact, no ischemia, EF 45-54%. He is not longer on Plavix , but remains on ASA 81 mg daily. At last visit, he was doing well from cardiac standpoint. Golfing regularly without exertional chest discomfort. EKG without ischemic features. No ischemic testing recommended at that time. In regards to Preoperative CV clearance: According to the Revised Cardiac Risk Index (RCRI), his Perioperative Risk of Major Cardiac Event is (%): 0.9. His Functional Capacity in METs is: 6.36 according to the Duke Activity Status Index (DASI). Therefore, based on ACC/AHA guidelines, patient would be at acceptable risk for the planned procedure without further cardiovascular testing. I will  route this recommendation to the requesting party via Epic fax function.   In regards to Aspirin : A shoulder arthroplasty is considered high risk for bleeding and patient's PCI was over 1 year ago (2023). Therefore, per our office algorithm, OK to hold Aspirin  if necessary. One year follow-up planned.  Last ASA 1/28//2026.    Anesthesia team to evaluate on the day of surgery.  VS: BP (!) 133/93   Pulse 86   Temp 36.9 C   Resp 18   Ht 5' 8 (1.727 m)   Wt 85.8 kg   SpO2 96%   BMI 28.77 kg/m    PROVIDERS: Hilts, Ozell, MD is PCP  Delford Coy, MD is cardiologist  LABS: Labs reviewed: Acceptable for surgery. A1c 6.0% 10/10/2024. (all labs ordered are listed, but only abnormal results are displayed)  Labs Reviewed  SURGICAL PCR SCREEN  CBC  BASIC METABOLIC PANEL WITH GFR    IMAGES: CT Left Shoulder 11/03/2024: IMPRESSION: 1. Severe glenohumeral joint osteoarthritis. 2. Moderate acromioclavicular joint osteoarthritis.  MRI Left Shoulder 10/18/2024: IMPRESSION: 1. Severe supraspinatus tendinosis with a macerated appearance measuring 10 mm AP and 13 mm medial-lateral functionally likely reflects a full-thickness tear. 2. Moderate infraspinatus tendinosis. 3. Mild subscapularis tendinosis with a small low-grade partial-thickness articular surface tear. 4. Moderate tendinosis of the intra-articular portion of the long head of the biceps tendon. 5. Severe osteoarthritis of the glenohumeral joint.   CTA Chest 02/09/2024: IMPRESSION: 1. No evidence of pulmonary embolus. 2. No acute intrathoracic process. 3. Aortic Atherosclerosis (ICD10-I70.0) and Emphysema (ICD10-J43.9). 4. Coronary artery atherosclerosis.     EKG: 12/06/2024: NSR     CV: LE Venous US  02/09/2024: Summary: RIGHT: - No evidence of deep vein  thrombosis in the lower extremity. No indirect evidence of obstruction proximal to the inguinal ligament. - No cystic structure found in the popliteal fossa. -  Increased skin thickness in right lateral upper calf where patient is complaining of pain. Mild superficial edema seen in this area also. LEFT: - No evidence of common femoral vein obstruction.     Nuclear stress test 12/23/2022:   Findings are consistent with no ischemia. The study is low risk.   No ST deviation was noted.   LV perfusion is abnormal. Defect 1: There is a medium defect with mild reduction in uptake present in the apical to basal inferior location(s) that is fixed. There is normal wall motion in the defect area. Consistent with artifact.   Left ventricular function is abnormal. Global function is mildly reduced. Nuclear stress EF: 52 %. The left ventricular ejection fraction is mildly decreased (45-54%). End diastolic cavity size is normal. End systolic cavity size is normal.   Prior study not available for comparison.   Inferior wall with decreased uptake at rest that improves slightly with stress, most consistent with artifact. No ischemia noted. Mildly reduced LVEF without focal wall motion abnormalities.     Cardiac cath 07/08/2022:   Prox RCA lesion is 20% stenosed.   Ost Cx to Prox Cx lesion is 40% stenosed.   Ramus lesion is 40% stenosed.   Prox LAD to Mid LAD lesion is 70% stenosed.   A drug-eluting stent was successfully placed using a SYNERGY XD [id number].   Post intervention, there is a 0% residual stenosis.   Severe mid LAD stenosis. RFR 0.84.  Mild non-obstructive disease in the ostial Circumflex and intermediate branch.  Mild non-obstructive disease in the mid RCA Successful PTCA/DES x 1 mid LAD   Recommendations: Continue DAPT with ASA and Plavix  for at least six months. Continue beta blocker and statin.    Past Medical History:  Diagnosis Date   Arthritis    COPD (chronic obstructive pulmonary disease) (HCC)    Coronary artery disease    DES placed   Hypertension 02/12/2020   Pre-diabetes     Past Surgical History:  Procedure Laterality Date    ANKLE FUSION Left 11/09/2004   BACK SURGERY  11/09/2006   L5-5 fusion    CORONARY PRESSURE/FFR STUDY N/A 07/08/2022   Procedure: INTRAVASCULAR PRESSURE WIRE/FFR STUDY;  Surgeon: Verlin Lonni BIRCH, MD;  Location: [facility];  Service: Cardiovascular;  Laterality: N/A;   CORONARY STENT INTERVENTION N/A 07/08/2022   Procedure: CORONARY STENT INTERVENTION;  Surgeon: Verlin Lonni BIRCH, MD;  Location: [facility];  Service: Cardiovascular;  Laterality: N/A;   INGUINAL HERNIA REPAIR Right 12/27/2023   Procedure: OPEN RIGHT INGUINAL HERNIA REPAIR WITH MESH;  Surgeon: Ebbie Cough, MD;  Location: [facility] OR;  Service: General;  Laterality: Right;   KNEE CLOSED REDUCTION Right 03/14/2024   Procedure: CLOSED RIGHT KNEE MANIPULATION;  Surgeon: Addie Cordella Hamilton, MD;  Location: [facility] OR;  Service: Orthopedics;  Laterality: Right;   LEFT HEART CATH AND CORONARY ANGIOGRAPHY N/A 07/08/2022   Procedure: LEFT HEART CATH AND CORONARY ANGIOGRAPHY;  Surgeon: Verlin Lonni BIRCH, MD;  Location: [facility];  Service: Cardiovascular;  Laterality: N/A;   left wrist surgery     TOTAL KNEE ARTHROPLASTY Right 01/27/2024   Procedure: RIGHT TOTAL KNEE ARTHROPLASTY;  Surgeon: Addie Cordella Hamilton, MD;  Location: [facility] OR;  Service: Orthopedics;  Laterality: Right;    MEDICATIONS:  albuterol  (VENTOLIN  HFA) 108 (90 Base) MCG/ACT inhaler  amLODipine  (NORVASC ) 5 MG tablet   aspirin  EC 81 MG tablet   baclofen  (LIORESAL ) 10 MG tablet   Fluticasone -Umeclidin-Vilant (TRELEGY ELLIPTA) 200-62.5-25 MCG/ACT AEPB   HYDROcodone -acetaminophen  (NORCO) 7.5-325 MG tablet   nitroGLYCERIN  (NITROSTAT ) 0.4 MG SL tablet   rosuvastatin  (CRESTOR ) 40 MG tablet   sildenafil  (REVATIO ) 20 MG tablet   No current facility-administered medications for this encounter.    Isaiah Ruder, PA-C Surgical Short Stay/Anesthesiology [facility] Phone [phone] [facility] Phone [phone] 12/08/2024 3:34  PM

## 2024-12-08 NOTE — Anesthesia Preprocedure Evaluation (Signed)
"                                    Anesthesia Evaluation    Airway        Dental   Pulmonary Current Smoker          Cardiovascular hypertension,      Neuro/Psych    GI/Hepatic   Endo/Other    Renal/GU      Musculoskeletal   Abdominal   Peds  Hematology   Anesthesia Other Findings   Reproductive/Obstetrics                              Anesthesia Physical Anesthesia Plan  ASA:   Anesthesia Plan:    Post-op Pain Management:    Induction:   PONV Risk Score and Plan:   Airway Management Planned:   Additional Equipment:   Intra-op Plan:   Post-operative Plan:   Informed Consent:   Plan Discussed with:   Anesthesia Plan Comments: (PAT note written 12/08/2024 by Greyson Peavy, PA-C.  )        Anesthesia Quick Evaluation  "

## 2024-12-12 DIAGNOSIS — Z01818 Encounter for other preprocedural examination: Secondary | ICD-10-CM

## 2024-12-12 NOTE — Progress Notes (Addendum)
 Pt called to notify of surgery date of 12/28/24 with arrival to the hospital at 9:15. Pt educated to stop drinking clear liquids and complete pre surgery ensure by 8:15. Patient verbalized understanding. Pt educated to follow Dr Brion instructions on stopping/holding Aspirin . Per wife, pt is to stop 1 week prior to surgery.

## 2024-12-27 ENCOUNTER — Encounter: Admitting: Orthopedic Surgery

## 2024-12-28 ENCOUNTER — Encounter (HOSPITAL_COMMUNITY): Admission: RE | Payer: Self-pay | Source: Home / Self Care

## 2024-12-28 ENCOUNTER — Ambulatory Visit (HOSPITAL_COMMUNITY): Admission: RE | Admit: 2024-12-28 | Payer: Self-pay | Admitting: Orthopedic Surgery

## 2024-12-28 ENCOUNTER — Encounter (HOSPITAL_COMMUNITY): Payer: Self-pay | Admitting: Physician Assistant

## 2024-12-28 DIAGNOSIS — Z01818 Encounter for other preprocedural examination: Secondary | ICD-10-CM

## 2025-01-12 ENCOUNTER — Encounter: Admitting: Orthopedic Surgery
